# Patient Record
Sex: Female | Born: 1950 | Race: White | Hispanic: No | Marital: Married | Smoking: Never smoker
Health system: Southern US, Community
[De-identification: ages and names within clinical notes are randomized; demographics above are authoritative.]

## PROBLEM LIST (undated history)

## (undated) DIAGNOSIS — U071 COVID-19: Secondary | ICD-10-CM

## (undated) DIAGNOSIS — R000 Tachycardia, unspecified: Secondary | ICD-10-CM

## (undated) DIAGNOSIS — Z86718 Personal history of other venous thrombosis and embolism: Secondary | ICD-10-CM

## (undated) DIAGNOSIS — M1711 Unilateral primary osteoarthritis, right knee: Secondary | ICD-10-CM

## (undated) DIAGNOSIS — Z923 Personal history of irradiation: Secondary | ICD-10-CM

## (undated) DIAGNOSIS — E785 Hyperlipidemia, unspecified: Secondary | ICD-10-CM

## (undated) DIAGNOSIS — T7840XA Allergy, unspecified, initial encounter: Secondary | ICD-10-CM

## (undated) DIAGNOSIS — Z7901 Long term (current) use of anticoagulants: Secondary | ICD-10-CM

## (undated) DIAGNOSIS — I700 Atherosclerosis of aorta: Secondary | ICD-10-CM

## (undated) DIAGNOSIS — E559 Vitamin D deficiency, unspecified: Secondary | ICD-10-CM

## (undated) DIAGNOSIS — I10 Essential (primary) hypertension: Secondary | ICD-10-CM

## (undated) DIAGNOSIS — D126 Benign neoplasm of colon, unspecified: Secondary | ICD-10-CM

## (undated) DIAGNOSIS — C801 Malignant (primary) neoplasm, unspecified: Secondary | ICD-10-CM

## (undated) DIAGNOSIS — M1991 Primary osteoarthritis, unspecified site: Secondary | ICD-10-CM

## (undated) DIAGNOSIS — C541 Malignant neoplasm of endometrium: Secondary | ICD-10-CM

## (undated) HISTORY — DX: Malignant (primary) neoplasm, unspecified: C80.1

## (undated) HISTORY — DX: COVID-19: U07.1

## (undated) HISTORY — DX: Long term (current) use of anticoagulants: Z79.01

## (undated) HISTORY — DX: Allergy, unspecified, initial encounter: T78.40XA

## (undated) HISTORY — PX: ABDOMINAL HYSTERECTOMY: SHX81

## (undated) HISTORY — DX: Personal history of other venous thrombosis and embolism: Z86.718

---

## 2000-07-21 ENCOUNTER — Other Ambulatory Visit: Admission: RE | Admit: 2000-07-21 | Discharge: 2000-07-21 | Payer: Self-pay | Admitting: Obstetrics and Gynecology

## 2012-09-23 ENCOUNTER — Emergency Department: Payer: Self-pay | Admitting: Internal Medicine

## 2012-09-23 LAB — URINALYSIS, COMPLETE
Glucose,UR: NEGATIVE mg/dL (ref 0–75)
Ketone: NEGATIVE
Leukocyte Esterase: NEGATIVE
Ph: 6 (ref 4.5–8.0)
Protein: NEGATIVE
RBC,UR: 199 /HPF (ref 0–5)
Squamous Epithelial: 8
WBC UR: 2 /HPF (ref 0–5)

## 2012-09-23 LAB — COMPREHENSIVE METABOLIC PANEL
Anion Gap: 10 (ref 7–16)
Bilirubin,Total: 0.4 mg/dL (ref 0.2–1.0)
Calcium, Total: 9 mg/dL (ref 8.5–10.1)
Co2: 25 mmol/L (ref 21–32)
Creatinine: 0.98 mg/dL (ref 0.60–1.30)
EGFR (Non-African Amer.): >60
Glucose: 142 mg/dL — ABNORMAL HIGH (ref 65–99)
Osmolality: 283 (ref 275–301)
Potassium: 3.9 mmol/L (ref 3.5–5.1)
SGOT(AST): 21 U/L (ref 15–37)
SGPT (ALT): 26 U/L (ref 12–78)
Sodium: 141 mmol/L (ref 136–145)

## 2012-09-23 LAB — CBC
HCT: 42.5 % (ref 35.0–47.0)
MCH: 31.2 pg (ref 26.0–34.0)
MCHC: 33.1 g/dL (ref 32.0–36.0)
RBC: 4.5 10*6/uL (ref 3.80–5.20)

## 2020-07-22 DIAGNOSIS — Z86711 Personal history of pulmonary embolism: Secondary | ICD-10-CM

## 2020-07-22 HISTORY — DX: Personal history of pulmonary embolism: Z86.711

## 2021-06-04 DIAGNOSIS — M25562 Pain in left knee: Secondary | ICD-10-CM | POA: Diagnosis not present

## 2021-06-06 DIAGNOSIS — S83282A Other tear of lateral meniscus, current injury, left knee, initial encounter: Secondary | ICD-10-CM | POA: Diagnosis not present

## 2021-06-06 DIAGNOSIS — M25562 Pain in left knee: Secondary | ICD-10-CM | POA: Diagnosis not present

## 2021-06-07 ENCOUNTER — Ambulatory Visit
Admission: RE | Admit: 2021-06-07 | Discharge: 2021-06-07 | Disposition: A | Discharge status: Home / Self Care | Payer: Medicare Other | Source: Ambulatory Visit | Attending: Student | Admitting: Student

## 2021-06-07 ENCOUNTER — Other Ambulatory Visit: Payer: Self-pay | Admitting: Student

## 2021-06-07 ENCOUNTER — Other Ambulatory Visit: Payer: Self-pay

## 2021-06-07 DIAGNOSIS — M25562 Pain in left knee: Secondary | ICD-10-CM | POA: Insufficient documentation

## 2021-06-07 DIAGNOSIS — S83282A Other tear of lateral meniscus, current injury, left knee, initial encounter: Secondary | ICD-10-CM | POA: Insufficient documentation

## 2021-06-08 DIAGNOSIS — M25562 Pain in left knee: Secondary | ICD-10-CM | POA: Diagnosis not present

## 2021-06-11 ENCOUNTER — Other Ambulatory Visit: Payer: Self-pay | Admitting: Surgery

## 2021-06-11 DIAGNOSIS — M1712 Unilateral primary osteoarthritis, left knee: Secondary | ICD-10-CM | POA: Insufficient documentation

## 2021-06-11 DIAGNOSIS — S83232A Complex tear of medial meniscus, current injury, left knee, initial encounter: Secondary | ICD-10-CM | POA: Diagnosis not present

## 2021-06-12 ENCOUNTER — Ambulatory Visit: Payer: Medicare Other | Admitting: Anesthesiology

## 2021-06-12 ENCOUNTER — Encounter: Payer: Self-pay | Admitting: Surgery

## 2021-06-12 ENCOUNTER — Ambulatory Visit
Admission: RE | Admit: 2021-06-12 | Discharge: 2021-06-12 | Disposition: A | Discharge status: Home / Self Care | Payer: Medicare Other | Attending: Surgery | Admitting: Surgery

## 2021-06-12 ENCOUNTER — Other Ambulatory Visit: Payer: Self-pay

## 2021-06-12 ENCOUNTER — Encounter
Admission: RE | Disposition: A | Discharge status: Home / Self Care | Payer: Self-pay | Source: Home / Self Care | Attending: Surgery

## 2021-06-12 DIAGNOSIS — S83232A Complex tear of medial meniscus, current injury, left knee, initial encounter: Secondary | ICD-10-CM | POA: Diagnosis not present

## 2021-06-12 DIAGNOSIS — M1712 Unilateral primary osteoarthritis, left knee: Secondary | ICD-10-CM | POA: Insufficient documentation

## 2021-06-12 DIAGNOSIS — X58XXXA Exposure to other specified factors, initial encounter: Secondary | ICD-10-CM | POA: Diagnosis not present

## 2021-06-12 DIAGNOSIS — S83242A Other tear of medial meniscus, current injury, left knee, initial encounter: Secondary | ICD-10-CM | POA: Diagnosis not present

## 2021-06-12 HISTORY — PX: KNEE ARTHROSCOPY: SHX127

## 2021-06-12 SURGERY — ARTHROSCOPY, KNEE
Anesthesia: General | Site: Knee | Laterality: Left

## 2021-06-12 MED ORDER — CHLORHEXIDINE GLUCONATE 0.12 % MT SOLN
OROMUCOSAL | Status: AC
  Administered 2021-06-12: 15 mL via OROMUCOSAL
  Filled 2021-06-12: qty 15

## 2021-06-12 MED ORDER — PROPOFOL 10 MG/ML IV BOLUS
INTRAVENOUS | Status: DC | PRN
  Administered 2021-06-12: 150 mg via INTRAVENOUS

## 2021-06-12 MED ORDER — PHENYLEPHRINE HCL-NACL 20-0.9 MG/250ML-% IV SOLN
INTRAVENOUS | Status: DC | PRN
  Administered 2021-06-12: 30 ug/min via INTRAVENOUS

## 2021-06-12 MED ORDER — LIDOCAINE HCL 1 % IJ SOLN
INTRAMUSCULAR | Status: DC | PRN
  Administered 2021-06-12: 60 mL

## 2021-06-12 MED ORDER — LIDOCAINE HCL (CARDIAC) PF 100 MG/5ML IV SOSY
PREFILLED_SYRINGE | INTRAVENOUS | Status: DC | PRN
  Administered 2021-06-12: 60 mg via INTRAVENOUS

## 2021-06-12 MED ORDER — CLINDAMYCIN PHOSPHATE 900 MG/50ML IV SOLN
900.0000 mg | INTRAVENOUS | Status: AC
  Administered 2021-06-12: 900 mg via INTRAVENOUS

## 2021-06-12 MED ORDER — FENTANYL CITRATE (PF) 100 MCG/2ML IJ SOLN
INTRAMUSCULAR | Status: DC | PRN
  Administered 2021-06-12 (×4): 25 ug via INTRAVENOUS

## 2021-06-12 MED ORDER — PHENYLEPHRINE HCL (PRESSORS) 10 MG/ML IV SOLN
INTRAVENOUS | Status: DC | PRN
  Administered 2021-06-12: 50 ug via INTRAVENOUS
  Administered 2021-06-12 (×2): 100 ug via INTRAVENOUS

## 2021-06-12 MED ORDER — ONDANSETRON HCL 4 MG/2ML IJ SOLN
INTRAMUSCULAR | Status: AC
  Filled 2021-06-12: qty 2

## 2021-06-12 MED ORDER — TRAMADOL HCL 50 MG PO TABS
ORAL_TABLET | ORAL | Status: AC
  Administered 2021-06-12: 50 mg via ORAL
  Filled 2021-06-12: qty 1

## 2021-06-12 MED ORDER — CHLORHEXIDINE GLUCONATE 0.12 % MT SOLN: 15.0000 mL | Freq: Once | OROMUCOSAL | Status: AC

## 2021-06-12 MED ORDER — LIDOCAINE HCL (PF) 1 % IJ SOLN
INTRAMUSCULAR | Status: AC
  Filled 2021-06-12: qty 30

## 2021-06-12 MED ORDER — MIDAZOLAM HCL 2 MG/2ML IJ SOLN
INTRAMUSCULAR | Status: AC
  Filled 2021-06-12: qty 2

## 2021-06-12 MED ORDER — ONDANSETRON HCL 4 MG/2ML IJ SOLN: 4.0000 mg | Freq: Four times a day (QID) | INTRAMUSCULAR | Status: DC | PRN

## 2021-06-12 MED ORDER — TRAMADOL HCL 50 MG PO TABS: 50.0000 mg | ORAL_TABLET | Freq: Four times a day (QID) | ORAL | Status: DC | PRN

## 2021-06-12 MED ORDER — TRAMADOL HCL 50 MG PO TABS: 50.0000 mg | ORAL_TABLET | Freq: Four times a day (QID) | ORAL | 0 refills | Status: DC | PRN

## 2021-06-12 MED ORDER — BUPIVACAINE-EPINEPHRINE (PF) 0.5% -1:200000 IJ SOLN
INTRAMUSCULAR | Status: AC
  Filled 2021-06-12: qty 30

## 2021-06-12 MED ORDER — ONDANSETRON HCL 4 MG/2ML IJ SOLN
INTRAMUSCULAR | Status: DC | PRN
  Administered 2021-06-12: 4 mg via INTRAVENOUS

## 2021-06-12 MED ORDER — FENTANYL CITRATE (PF) 100 MCG/2ML IJ SOLN
25.0000 ug | INTRAMUSCULAR | Status: DC | PRN
  Administered 2021-06-12: 50 ug via INTRAVENOUS

## 2021-06-12 MED ORDER — FENTANYL CITRATE (PF) 100 MCG/2ML IJ SOLN
INTRAMUSCULAR | Status: AC
  Administered 2021-06-12: 50 ug via INTRAVENOUS
  Filled 2021-06-12: qty 2

## 2021-06-12 MED ORDER — FENTANYL CITRATE (PF) 100 MCG/2ML IJ SOLN
INTRAMUSCULAR | Status: AC
  Filled 2021-06-12: qty 2

## 2021-06-12 MED ORDER — BUPIVACAINE-EPINEPHRINE (PF) 0.5% -1:200000 IJ SOLN
INTRAMUSCULAR | Status: DC | PRN
  Administered 2021-06-12: 20 mL

## 2021-06-12 MED ORDER — LACTATED RINGERS IR SOLN
Status: DC | PRN
  Administered 2021-06-12: 3000 mL

## 2021-06-12 MED ORDER — DEXAMETHASONE SODIUM PHOSPHATE 10 MG/ML IJ SOLN
INTRAMUSCULAR | Status: AC
  Filled 2021-06-12: qty 1

## 2021-06-12 MED ORDER — KETOROLAC TROMETHAMINE 15 MG/ML IJ SOLN
INTRAMUSCULAR | Status: AC
  Administered 2021-06-12: 15 mg via INTRAVENOUS
  Filled 2021-06-12: qty 1

## 2021-06-12 MED ORDER — GLYCOPYRROLATE 0.2 MG/ML IJ SOLN
INTRAMUSCULAR | Status: DC | PRN
  Administered 2021-06-12: .2 mg via INTRAVENOUS

## 2021-06-12 MED ORDER — METOCLOPRAMIDE HCL 10 MG PO TABS: 5.0000 mg | ORAL_TABLET | Freq: Three times a day (TID) | ORAL | Status: DC | PRN

## 2021-06-12 MED ORDER — MEPERIDINE HCL 25 MG/ML IJ SOLN
6.2500 mg | INTRAMUSCULAR | Status: DC | PRN
Start: 2021-06-12 — End: 2021-06-13

## 2021-06-12 MED ORDER — METOCLOPRAMIDE HCL 5 MG/ML IJ SOLN: 5.0000 mg | Freq: Three times a day (TID) | INTRAMUSCULAR | Status: DC | PRN

## 2021-06-12 MED ORDER — CLINDAMYCIN PHOSPHATE 900 MG/50ML IV SOLN
INTRAVENOUS | Status: AC
  Filled 2021-06-12: qty 50

## 2021-06-12 MED ORDER — MIDAZOLAM HCL 2 MG/2ML IJ SOLN
INTRAMUSCULAR | Status: DC | PRN
  Administered 2021-06-12: 2 mg via INTRAVENOUS

## 2021-06-12 MED ORDER — PROPOFOL 10 MG/ML IV BOLUS
INTRAVENOUS | Status: AC
  Filled 2021-06-12: qty 20

## 2021-06-12 MED ORDER — ONDANSETRON HCL 4 MG/2ML IJ SOLN: 4.0000 mg | Freq: Once | INTRAMUSCULAR | Status: DC | PRN

## 2021-06-12 MED ORDER — SODIUM CHLORIDE 0.9 % IV SOLN: INTRAVENOUS | Status: DC

## 2021-06-12 MED ORDER — DEXAMETHASONE SODIUM PHOSPHATE 10 MG/ML IJ SOLN
INTRAMUSCULAR | Status: DC | PRN
  Administered 2021-06-12: 10 mg via INTRAVENOUS

## 2021-06-12 MED ORDER — ORAL CARE MOUTH RINSE: 15.0000 mL | Freq: Once | OROMUCOSAL | Status: AC

## 2021-06-12 MED ORDER — ACETAMINOPHEN 10 MG/ML IV SOLN
INTRAVENOUS | Status: DC | PRN
Start: 2021-06-12 — End: 2021-06-12
  Administered 2021-06-12: 1000 mg via INTRAVENOUS

## 2021-06-12 MED ORDER — ONDANSETRON HCL 4 MG PO TABS: 4.0000 mg | ORAL_TABLET | Freq: Four times a day (QID) | ORAL | Status: DC | PRN

## 2021-06-12 MED ORDER — ACETAMINOPHEN 10 MG/ML IV SOLN
INTRAVENOUS | Status: AC
  Filled 2021-06-12: qty 100

## 2021-06-12 MED ORDER — LIDOCAINE HCL (PF) 2 % IJ SOLN
INTRAMUSCULAR | Status: AC
  Filled 2021-06-12: qty 5

## 2021-06-12 MED ORDER — LACTATED RINGERS IV SOLN: INTRAVENOUS | Status: DC

## 2021-06-12 MED ORDER — KETOROLAC TROMETHAMINE 15 MG/ML IJ SOLN: 15.0000 mg | Freq: Once | INTRAMUSCULAR | Status: AC

## 2021-06-12 SURGICAL SUPPLY — 52 items
ANCH SUT 4.5 FTPRNT PEEK-OPTM (Anchor) ×1 IMPLANT
ANCH SUT BLU HS FBR ULTRALOOP (SUTURE) ×1
ANCHOR 4.5 FOOTPRINT ULTRA (Anchor) ×1 IMPLANT
APL PRP STRL LF DISP 70% ISPRP (MISCELLANEOUS) ×1
BAG COUNTER SPONGE SURGICOUNT (BAG) IMPLANT
BAG SPNG CNTER NS LX DISP (BAG)
BIT DRILL 4X4.5 FOOTPRINT STR (BIT) IMPLANT
BLADE FULL RADIUS 3.5 (BLADE) ×2 IMPLANT
BLADE SHAVER 4.5X7 STR FR (MISCELLANEOUS) ×2 IMPLANT
BNDG ELASTIC 6X5.8 VLCR STR LF (GAUZE/BANDAGES/DRESSINGS) ×2 IMPLANT
BNDG ESMARK 6X12 TAN STRL LF (GAUZE/BANDAGES/DRESSINGS) ×2 IMPLANT
BRACE KNEE POST OP SHORT (BRACE) ×1 IMPLANT
CHLORAPREP W/TINT 26 (MISCELLANEOUS) ×2 IMPLANT
CUFF TOURN SGL QUICK 24 (TOURNIQUET CUFF)
CUFF TOURN SGL QUICK 34 (TOURNIQUET CUFF)
CUFF TRNQT CYL 24X4X16.5-23 (TOURNIQUET CUFF) IMPLANT
CUFF TRNQT CYL 34X4.125X (TOURNIQUET CUFF) IMPLANT
DRAPE ARTHRO LIMB 89X125 STRL (DRAPES) ×2 IMPLANT
DRAPE IMP U-DRAPE 54X76 (DRAPES) ×2 IMPLANT
DRILL 4X4.5 FOOTPRINT STR (BIT) ×2
ELECT REM PT RETURN 9FT ADLT (ELECTROSURGICAL) ×2
ELECTRODE REM PT RTRN 9FT ADLT (ELECTROSURGICAL) ×1 IMPLANT
GAUZE SPONGE 4X4 12PLY STRL (GAUZE/BANDAGES/DRESSINGS) ×2 IMPLANT
GLOVE SURG ENC MOIS LTX SZ8 (GLOVE) ×4 IMPLANT
GLOVE SURG ENC TEXT LTX SZ7 (GLOVE) ×4 IMPLANT
GLOVE SURG UNDER LTX SZ8 (GLOVE) ×2 IMPLANT
GLOVE SURG UNDER POLY LF SZ7.5 (GLOVE) ×2 IMPLANT
GOWN STRL REUS W/ TWL LRG LVL3 (GOWN DISPOSABLE) ×1 IMPLANT
GOWN STRL REUS W/ TWL XL LVL3 (GOWN DISPOSABLE) ×2 IMPLANT
GOWN STRL REUS W/TWL LRG LVL3 (GOWN DISPOSABLE) ×2
GOWN STRL REUS W/TWL XL LVL3 (GOWN DISPOSABLE) ×4
IV LACTATED RINGER IRRG 3000ML (IV SOLUTION) ×2
IV LR IRRIG 3000ML ARTHROMATIC (IV SOLUTION) ×1 IMPLANT
KIT MENISCAL ROOT REPAIR (KITS) ×1 IMPLANT
KIT TURNOVER KIT A (KITS) ×2 IMPLANT
MANIFOLD NEPTUNE II (INSTRUMENTS) ×4 IMPLANT
NDL HYPO 21X1.5 SAFETY (NEEDLE) ×1 IMPLANT
NEEDLE HYPO 21X1.5 SAFETY (NEEDLE) ×2 IMPLANT
PACK ARTHROSCOPY KNEE (MISCELLANEOUS) ×2 IMPLANT
PASSER SUT FASTPASS MINI (KITS) ×1 IMPLANT
PENCIL ELECTRO HAND CTR (MISCELLANEOUS) ×2 IMPLANT
SPONGE T-LAP 18X18 ~~LOC~~+RFID (SPONGE) ×2 IMPLANT
SUT PROLENE 4 0 PS 2 18 (SUTURE) ×2 IMPLANT
SUT TICRON COATED BLUE 2 0 30 (SUTURE) IMPLANT
SUT ULTRALOOP BLUE #2 26IN (SUTURE) ×1 IMPLANT
SUT VIC AB 3-0 SH 27 (SUTURE) ×2
SUT VIC AB 3-0 SH 27X BRD (SUTURE) IMPLANT
SYR 50ML LL SCALE MARK (SYRINGE) ×2 IMPLANT
TUBING INFLOW SET DBFLO PUMP (TUBING) ×2 IMPLANT
WAND WEREWOLF FLOW 90D (MISCELLANEOUS) ×2 IMPLANT
WATER STERILE IRR 500ML POUR (IV SOLUTION) ×2 IMPLANT
hi-fiber ultraloop ×2 IMPLANT

## 2021-06-12 NOTE — Transfer of Care (Signed)
Immediate Anesthesia Transfer of Care Note  Patient: Sandra Phillips  Procedure(s) Performed: LEFT KNEE ARTHROSCOPY WITH DEBRIDEMENT AND REPAIR OF A POSTERIOR MEDIAL ROOT TEAR AND ABRASION CONDOPLASTY (Left: Knee)  Patient Location: PACU  Anesthesia Type:General  Level of Consciousness: awake, alert  and oriented  Airway & Oxygen Therapy: Patient Spontanous Breathing  Post-op Assessment: Report given to RN and Post -op Vital signs reviewed and stable  Post vital signs: Reviewed and stable  Last Vitals:  Vitals Value Taken Time  BP 146/76   Temp    Pulse 98   Resp 20   SpO2 99     Last Pain:  Vitals:   06/12/21 1404  TempSrc: Temporal  PainSc: 0-No pain         Complications: No notable events documented.

## 2021-06-12 NOTE — H&P (Signed)
History of Present Illness: Sandra Phillips is a 70 y.o.female who is being referred by Cameron Proud, PA-C, for left knee pain. The symptoms began a week ago and developed suddenly. Apparently she was ascending some stairs with some luggage when she felt a sharp "pop" in her knee associated with a stabbing pain, causing her to fall to the ground. She presented to the urgent care clinic where x-rays were obtained and reportedly were negative for any fracture. She followed up with Cameron Proud, PA-C, who sent her for an MRI scan to evaluate for meniscal pathology and referred her to me for further evaluation and treatment. She reports 3/10 pain on today's visit but notes that her pain is much worse if she attempts to put any weight on her leg. The pain is located along the medial aspect of the knee. The pain is described as aching, stabbing and throbbing. The symptoms are aggravated at higher levels of activity, rising from a chair, walking, standing and standing pivot. She also describes no mechanical symptoms, but notes that she has not tried to put any weight on her leg either. She has mild associated swelling and no deformity. She has tried acetaminophen, narcotic medications and ice with temporary partial relief.  Current Outpatient Medications:  acetaminophen (TYLENOL) 500 MG tablet Take 2 tablets (1,000 mg total) by mouth every 6 (six) hours as needed for Pain   diazePAM (VALIUM) 5 MG tablet Take 1 tablet 30 minutes prior to MRI. If needed take a second at start of exam. 2 tablet 0   predniSONE (DELTASONE) 10 mg tablet pack 6 day taper. Take as directed with food 21 tablet 0   traMADoL (ULTRAM) 50 mg tablet Take 1 tablet (50 mg total) by mouth every 6 (six) hours as needed for up to 10 days 20 tablet 0   Allergies:   Penicillins Anaphylaxis   Past Medical History:   History of chicken pox   Past Surgical History:   wisdom teeth   Family History:   Bladder Cancer Mother   Diabetes type II Father    Alzheimer's disease Father   Rheum arthritis Sister   Social History:   Socioeconomic History:   Marital status: Single  Tobacco Use   Smoking status: Never   Smokeless tobacco: Never  Vaping Use   Vaping Use: Never used  Substance and Sexual Activity   Alcohol use: Yes   Drug use: Never   Review of Systems:  A comprehensive 14 point ROS was performed, reviewed, and the pertinent orthopaedic findings are documented in the HPI.  Physical Exam: Vitals:  06/11/21 1304  BP: 122/88  Weight: 99.8 kg (220 lb)  Height: 172.7 cm (5\' 8" )  PainSc: 3  PainLoc: Knee   General/Constitutional: The patient appears to be well-nourished, well-developed, and in no acute distress. Neuro/Psych: Normal mood and affect, oriented to person, place and time. Eyes: Non-icteric. Pupils are equal, round, and reactive to light, and exhibit synchronous movement. Lymphatic: No palpable adenopathy. Respiratory: Lungs clear to auscultation, Normal chest excursion, No wheezes and Non-labored breathing Cardiovascular: Regular rate and rhythm. No murmurs. and No edema, swelling or tenderness, except as noted in detailed exam. Vascular: No edema, swelling or tenderness, except as noted in detailed exam. Integumentary: No impressive skin lesions present, except as noted in detailed exam. Musculoskeletal: Unremarkable, except as noted in detailed exam.  Right knee exam: GAIT: unable to walk and uses crutches. ALIGNMENT: normal SKIN: unremarkable SWELLING: minimal EFFUSION: trace WARMTH: no warmth TENDERNESS:  moderate over the medial joint line mild tenderness along the lateral joint line ROM: 0 to 95 degrees with pain in maximal flexion McMURRAY'S: positive PATELLOFEMORAL: normal tracking with no peri-patellar tenderness and negative apprehension sign CREPITUS: no LACHMAN'S: negative PIVOT SHIFT: Not evaluated ANTERIOR DRAWER: negative POSTERIOR DRAWER: negative VARUS/VALGUS: stable  She is  neurovascularly intact to the left lower extremity and foot.  Knee Imaging: Recent AP and lateral non-weightbearing views, as well as a merchant view of the left knee are available for review and have been reviewed by myself. These films demonstrate mild degenerative changes, primarily involving the medial compartment with at most 10% medial joint space narrowing. Overall alignment is neutral. No fractures, lytic lesions, or abnormal calcifications are noted.  Knee Imaging, external: Left knee: A recent MRI scan of the left knee is available for review. By report, the study demonstrates evidence of a complex posterior medial root tear with further signal abnormalities consistent with a horizontal tear involving the posterior horn and body of the medial meniscus. Moderate chondral thinning of the medial compartment is noted. The lateral meniscus and ligaments appear to be in satisfactory condition. Mild degenerative changes of the lateral patellofemoral compartments also are noted. Both the films and report were reviewed by myself and discussed with the patient and her husband.  Assessment:   Primary osteoarthritis of left knee.   Complex tear of medial meniscus of left knee.   Plan: The treatment options were discussed with the patient and her husband. In addition, patient educational materials were provided regarding the diagnosis and treatment options. The patient is quite frustrated by her symptoms and functional patients, and is ready to consider more aggressive treatment options. Therefore, I have recommended a surgical procedure, specifically a left knee arthroscopy with debridement and repair of the posterior medial root tear versus partial medial meniscectomy. The procedure was discussed with the patient, as were the potential risks (including bleeding, infection, nerve and/or blood vessel injury, persistent or recurrent pain, failure of the repair, progression of arthritis, need for further  surgery, blood clots, strokes, heart attacks and/or arhythmias, pneumonia, etc.) and benefits. The patient states her understanding and wishes to proceed. All of the patient's questions and concerns were answered. She can call any time with further concerns. She will follow up post-surgery, routine.   H&P reviewed and patient re-examined. No changes.

## 2021-06-12 NOTE — Op Note (Signed)
06/12/2021  5:59 PM  Patient:   Sandra Phillips  Pre-Op Diagnosis:   Medial meniscus root tear with underlying degenerative joint disease, left knee.  Postoperative diagnosis:   Medial meniscus root tear with moderate degenerative joint disease, left knee.  Procedure:   Arthroscopic debridement/abrasion chondroplasty with arthroscopically-assisted repair of medial meniscus root tear, left knee.  Surgeon:   Pascal Lux, M.D.  Assistant:   Kinnie Feil, RNFA  Anesthesia:   General LMA.  Findings:   As above.  There were diffuse grade 2-3 chondromalacial changes involving the patella and femoral trochlea, as well as the medial femoral condyle and medial tibial plateau.  There were grade 1-2 chondromalacial changes involving the lateral tibial plateau, and grade 1 chondromalacial changes involving the lateral femoral condyle.  The anterior and posterior cruciate ligaments both were in excellent condition, as was the lateral meniscus.  Complications:   None.  EBL:   5 cc.  Total fluids:   800 cc of crystalloid.  Tourniquet time:   None  Drains:   None  Closure:   Staples.  Brief clinical note:   The patient is a 70 year old female who developed the sudden onset of medial sided left knee pain 1 week ago. These symptoms have persisted despite medications, activity modification, etc. The patient's history and examination were consistent with a medial meniscus tear. An MRI scan demonstrated the presence of a posterior horn medial meniscus root tear. The patient presents at this time for arthroscopy, debridement, and repair versus partial medial meniscectomy of her left knee.  Procedure:   The patient was brought into the operating room and lain in the supine position. After adequate general laryngeal mask anesthesia was obtained, a timeout was performed to verify the appropriate side. The patient's left knee was injected sterilely using a solution of 30 cc of 1% lidocaine and 30 cc of  0.5% Sensorcaine with epinephrine. The left lower extremity was prepped with ChloraPrep solution before being draped sterilely. Preoperative antibiotics were administered. The expected portal sites and surgical incision site were injected with 0.5% Sensorcaine with epinephrine.    The camera was placed in the anterolateral portal and instrumentation performed through the anteromedial portal. The knee was sequentially examined beginning in the suprapatellar pouch, then progressing to the patellofemoral space, the medial gutter compartment, the notch, and finally the lateral compartment and gutter. The findings were as described above. Abundant reactive synovial tissues anteriorly were debrided using the full-radius resector in order to improve visualization. 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  The medial meniscus was carefully probed and demonstrated an unstable tear of the meniscal root. This was repaired using the [facility] meniscal root repair system. The end of the tear was freshened with a full-radius resector before the attachment site on the proximal tibia was curetted and debrided with the full-radius resector to expose good bleeding bone. The [facility] guide was positioned in the over-the-top position and, utilizing the 55 degree angle setting, the drill/sleeve combination was drilled up into the proximal tibia through a short anterior incision. Once its position was verified intra-articularly, the central drill was removed, leaving the sleeve in place. Once its position was verified intra-articularly, the central drill was removed, leaving the sleeve in place. A looped passing suture was placed up through the retained sleeve and pulled out  through the anteromedial wound. Utilizing the FirstPass suture passer, two #2  FiberWire sutures were placed into the meniscal root. Using the passing loop, the FiberWire was drawn down through the drill hole in the proximal tibia and brought out anteriorly. A single [employer] anchor was placed in the anterior tibial cortex to secure the sutures. The repair was assessed and found to be stable to probing. It also appeared to be stable with range of motion of the knee. The instruments were removed from the joint after suctioning the excess fluid.   The subcutaneous tissues in the anterior wound were reapproximated using 2-0 Vicryl interrupted sutures before the skin was closed using 4-0 Prolene interrupted sutures. The portal sites also were closed using 4-0 Prolene interrupted sutures. A sterile bulky dressing was applied to the knee before the patient was placed into a hinged knee brace with the hinges set at 0-90, but locked in extension. The patient was then awakened, extubated, and returned to the recovery room in satisfactory condition after tolerating the procedure well.

## 2021-06-12 NOTE — Discharge Instructions (Signed)
Orthopedic discharge instructions: Keep dressing dry and intact.  May sponge bathe after dressing changed on post-op day #4 (Saturday).  Cover stitches with Band-Aids after drying off. Apply ice frequently to knee. Take ibuprofen 600-800 mg TID with meals for 7-10 days, then as necessary. Take tramadol as prescribed when needed.  May supplement with ES Tylenol if necessary. Start aspirin 325 mg daily for 6 weeks on 06/13/2021 (Wednesday). No weight-bearing on left leg - use crutches or crutches as needed. Keep brace on at all times except may loosen for bathing purposes. Follow-up in 10-14 days or as scheduled.

## 2021-06-12 NOTE — Anesthesia Preprocedure Evaluation (Signed)
Anesthesia Evaluation  Patient identified by MRN, date of birth, ID band Patient awake    Reviewed: Allergy & Precautions, H&P , NPO status , Patient's Chart, lab work & pertinent test results  Airway Mallampati: II  TM Distance: >3 FB     Dental no notable dental hx.    Pulmonary neg pulmonary ROS,    Pulmonary exam normal        Cardiovascular negative cardio ROS Normal cardiovascular exam     Neuro/Psych negative neurological ROS  negative psych ROS   GI/Hepatic negative GI ROS, Neg liver ROS,   Endo/Other  negative endocrine ROS  Renal/GU negative Renal ROS  negative genitourinary   Musculoskeletal negative musculoskeletal ROS (+)   Abdominal (+) + obese,   Peds negative pediatric ROS (+)  Hematology negative hematology ROS (+)   Anesthesia Other Findings   Reproductive/Obstetrics negative OB ROS                             Anesthesia Physical Anesthesia Plan  ASA: 2  Anesthesia Plan: General   Post-op Pain Management:    Induction: Intravenous  PONV Risk Score and Plan: 2 and Propofol infusion, Ondansetron and Midazolam  Airway Management Planned: LMA  Additional Equipment:   Intra-op Plan:   Post-operative Plan: Extubation in OR  Informed Consent: I have reviewed the patients History and Physical, chart, labs and discussed the procedure including the risks, benefits and alternatives for the proposed anesthesia with the patient or authorized representative who has indicated his/her understanding and acceptance.       Plan Discussed with: CRNA, Surgeon and Anesthesiologist  Anesthesia Plan Comments:         Anesthesia Quick Evaluation

## 2021-06-12 NOTE — Anesthesia Postprocedure Evaluation (Signed)
Anesthesia Post Note  Patient: Sandra Phillips  Procedure(s) Performed: LEFT KNEE ARTHROSCOPY WITH DEBRIDEMENT AND REPAIR OF A POSTERIOR MEDIAL ROOT TEAR AND ABRASION CONDOPLASTY (Left: Knee)  Patient location during evaluation: PACU Anesthesia Type: General Level of consciousness: awake and alert Pain management: pain level controlled Vital Signs Assessment: post-procedure vital signs reviewed and stable Respiratory status: spontaneous breathing, nonlabored ventilation and respiratory function stable Cardiovascular status: blood pressure returned to baseline and stable Postop Assessment: no apparent nausea or vomiting Anesthetic complications: no   No notable events documented.   Last Vitals:  Vitals:   06/12/21 1815 06/12/21 1848  BP: 130/79 (!) 146/70  Pulse: 88 73  Resp: 18 14  Temp:  36.4 C  SpO2: 96% 99%    Last Pain:  Vitals:   06/12/21 1848  TempSrc: Temporal  PainSc: 0-No pain                 [place] Ouch

## 2021-06-13 ENCOUNTER — Encounter: Payer: Self-pay | Admitting: Surgery

## 2021-07-04 DIAGNOSIS — Z9889 Other specified postprocedural states: Secondary | ICD-10-CM | POA: Diagnosis not present

## 2021-07-06 DIAGNOSIS — Z9889 Other specified postprocedural states: Secondary | ICD-10-CM | POA: Diagnosis not present

## 2021-07-07 ENCOUNTER — Inpatient Hospital Stay
Admission: EM | Admit: 2021-07-07 | Discharge: 2021-07-11 | DRG: 270 | Disposition: A | Discharge status: Home / Self Care | Payer: Medicare Other | Attending: Student in an Organized Health Care Education/Training Program | Admitting: Student in an Organized Health Care Education/Training Program

## 2021-07-07 ENCOUNTER — Emergency Department: Payer: Medicare Other

## 2021-07-07 DIAGNOSIS — M1712 Unilateral primary osteoarthritis, left knee: Secondary | ICD-10-CM | POA: Diagnosis present

## 2021-07-07 DIAGNOSIS — J9601 Acute respiratory failure with hypoxia: Secondary | ICD-10-CM | POA: Diagnosis present

## 2021-07-07 DIAGNOSIS — R0602 Shortness of breath: Secondary | ICD-10-CM | POA: Diagnosis not present

## 2021-07-07 DIAGNOSIS — I2699 Other pulmonary embolism without acute cor pulmonale: Secondary | ICD-10-CM | POA: Diagnosis not present

## 2021-07-07 DIAGNOSIS — I82432 Acute embolism and thrombosis of left popliteal vein: Secondary | ICD-10-CM | POA: Diagnosis not present

## 2021-07-07 DIAGNOSIS — Z880 Allergy status to penicillin: Secondary | ICD-10-CM | POA: Diagnosis not present

## 2021-07-07 DIAGNOSIS — M159 Polyosteoarthritis, unspecified: Secondary | ICD-10-CM | POA: Diagnosis not present

## 2021-07-07 DIAGNOSIS — R000 Tachycardia, unspecified: Secondary | ICD-10-CM | POA: Diagnosis not present

## 2021-07-07 DIAGNOSIS — Z20822 Contact with and (suspected) exposure to covid-19: Secondary | ICD-10-CM | POA: Diagnosis not present

## 2021-07-07 DIAGNOSIS — T68XXXA Hypothermia, initial encounter: Secondary | ICD-10-CM | POA: Diagnosis not present

## 2021-07-07 DIAGNOSIS — K449 Diaphragmatic hernia without obstruction or gangrene: Secondary | ICD-10-CM | POA: Diagnosis not present

## 2021-07-07 DIAGNOSIS — Z0389 Encounter for observation for other suspected diseases and conditions ruled out: Secondary | ICD-10-CM | POA: Diagnosis not present

## 2021-07-07 DIAGNOSIS — Z7982 Long term (current) use of aspirin: Secondary | ICD-10-CM | POA: Diagnosis not present

## 2021-07-07 DIAGNOSIS — K5900 Constipation, unspecified: Secondary | ICD-10-CM | POA: Diagnosis not present

## 2021-07-07 DIAGNOSIS — R0902 Hypoxemia: Secondary | ICD-10-CM | POA: Diagnosis not present

## 2021-07-07 DIAGNOSIS — M1991 Primary osteoarthritis, unspecified site: Secondary | ICD-10-CM | POA: Diagnosis not present

## 2021-07-07 DIAGNOSIS — Z8261 Family history of arthritis: Secondary | ICD-10-CM | POA: Diagnosis not present

## 2021-07-07 DIAGNOSIS — R52 Pain, unspecified: Secondary | ICD-10-CM | POA: Diagnosis not present

## 2021-07-07 DIAGNOSIS — I82442 Acute embolism and thrombosis of left tibial vein: Secondary | ICD-10-CM | POA: Diagnosis present

## 2021-07-07 DIAGNOSIS — R739 Hyperglycemia, unspecified: Secondary | ICD-10-CM | POA: Diagnosis not present

## 2021-07-07 DIAGNOSIS — Y838 Other surgical procedures as the cause of abnormal reaction of the patient, or of later complication, without mention of misadventure at the time of the procedure: Secondary | ICD-10-CM | POA: Diagnosis present

## 2021-07-07 DIAGNOSIS — M7989 Other specified soft tissue disorders: Secondary | ICD-10-CM | POA: Diagnosis not present

## 2021-07-07 DIAGNOSIS — D72828 Other elevated white blood cell count: Secondary | ICD-10-CM | POA: Diagnosis present

## 2021-07-07 DIAGNOSIS — I2609 Other pulmonary embolism with acute cor pulmonale: Secondary | ICD-10-CM | POA: Diagnosis present

## 2021-07-07 DIAGNOSIS — T81718A Complication of other artery following a procedure, not elsewhere classified, initial encounter: Secondary | ICD-10-CM | POA: Diagnosis present

## 2021-07-07 DIAGNOSIS — I9789 Other postprocedural complications and disorders of the circulatory system, not elsewhere classified: Principal | ICD-10-CM | POA: Diagnosis present

## 2021-07-07 DIAGNOSIS — Z86711 Personal history of pulmonary embolism: Secondary | ICD-10-CM

## 2021-07-07 DIAGNOSIS — I82439 Acute embolism and thrombosis of unspecified popliteal vein: Secondary | ICD-10-CM | POA: Diagnosis not present

## 2021-07-07 DIAGNOSIS — Z86718 Personal history of other venous thrombosis and embolism: Secondary | ICD-10-CM | POA: Diagnosis present

## 2021-07-07 DIAGNOSIS — I2782 Chronic pulmonary embolism: Secondary | ICD-10-CM | POA: Diagnosis not present

## 2021-07-07 HISTORY — DX: Primary osteoarthritis, unspecified site: M19.91

## 2021-07-07 LAB — COMPREHENSIVE METABOLIC PANEL
ALT: 21 U/L (ref 0–44)
AST: 23 U/L (ref 15–41)
Albumin: 3.6 g/dL (ref 3.5–5.0)
Alkaline Phosphatase: 91 U/L (ref 38–126)
Anion gap: 9 (ref 5–15)
BUN: 17 mg/dL (ref 8–23)
CO2: 21 mmol/L — ABNORMAL LOW (ref 22–32)
Calcium: 9.1 mg/dL (ref 8.9–10.3)
Chloride: 104 mmol/L (ref 98–111)
Creatinine, Ser: 0.86 mg/dL (ref 0.44–1.00)
GFR, Estimated: >60 mL/min (ref >60)
Glucose, Bld: 173 mg/dL — ABNORMAL HIGH (ref 70–99)
Potassium: 4 mmol/L (ref 3.5–5.1)
Sodium: 134 mmol/L — ABNORMAL LOW (ref 135–145)
Total Bilirubin: 0.5 mg/dL (ref 0.3–1.2)
Total Protein: 7.3 g/dL (ref 6.5–8.1)

## 2021-07-07 LAB — CBC WITH DIFFERENTIAL/PLATELET
Abs Immature Granulocytes: 0.06 10*3/uL (ref 0.00–0.07)
Basophils Absolute: 0 10*3/uL (ref 0.0–0.1)
Basophils Relative: 0 %
Eosinophils Absolute: 0 10*3/uL (ref 0.0–0.5)
Eosinophils Relative: 0 %
HCT: 40.6 % (ref 36.0–46.0)
Hemoglobin: 13.9 g/dL (ref 12.0–15.0)
Immature Granulocytes: 1 %
Lymphocytes Relative: 12 %
Lymphs Abs: 1.3 10*3/uL (ref 0.7–4.0)
MCH: 32.3 pg (ref 26.0–34.0)
MCHC: 34.2 g/dL (ref 30.0–36.0)
MCV: 94.4 fL (ref 80.0–100.0)
Monocytes Absolute: 0.7 10*3/uL (ref 0.1–1.0)
Monocytes Relative: 7 %
Neutro Abs: 8.6 10*3/uL — ABNORMAL HIGH (ref 1.7–7.7)
Neutrophils Relative %: 80 %
Platelets: 185 10*3/uL (ref 150–400)
RBC: 4.3 MIL/uL (ref 3.87–5.11)
RDW: 13.1 % (ref 11.5–15.5)
WBC: 10.8 10*3/uL — ABNORMAL HIGH (ref 4.0–10.5)
nRBC: 0 % (ref 0.0–0.2)

## 2021-07-07 LAB — RESP PANEL BY RT-PCR (FLU A&B, COVID) ARPGX2
Influenza A by PCR: NEGATIVE
Influenza B by PCR: NEGATIVE
SARS Coronavirus 2 by RT PCR: NEGATIVE

## 2021-07-07 LAB — LACTIC ACID, PLASMA
Lactic Acid, Venous: 3.1 mmol/L (ref 0.5–1.9)
Lactic Acid, Venous: 2.4 mmol/L (ref 0.5–1.9)

## 2021-07-07 LAB — BRAIN NATRIURETIC PEPTIDE: B Natriuretic Peptide: 292.6 pg/mL — ABNORMAL HIGH (ref 0.0–100.0)

## 2021-07-07 LAB — APTT: aPTT: 28 seconds (ref 24–36)

## 2021-07-07 LAB — PROTIME-INR
INR: 1.1 (ref 0.8–1.2)
Prothrombin Time: 14.6 seconds (ref 11.4–15.2)

## 2021-07-07 LAB — TROPONIN I (HIGH SENSITIVITY)
Troponin I (High Sensitivity): 491 ng/L (ref <18)
Troponin I (High Sensitivity): 970 ng/L (ref <18)

## 2021-07-07 MED ORDER — TRAMADOL HCL 50 MG PO TABS: 50.0000 mg | ORAL_TABLET | Freq: Four times a day (QID) | ORAL | Status: DC | PRN

## 2021-07-07 MED ORDER — ONDANSETRON HCL 4 MG/2ML IJ SOLN: 4.0000 mg | Freq: Four times a day (QID) | INTRAMUSCULAR | Status: DC | PRN

## 2021-07-07 MED ORDER — LACTATED RINGERS IV SOLN: INTRAVENOUS | Status: DC

## 2021-07-07 MED ORDER — ONDANSETRON HCL 4 MG PO TABS: 4.0000 mg | ORAL_TABLET | Freq: Four times a day (QID) | ORAL | Status: DC | PRN

## 2021-07-07 MED ORDER — ACETAMINOPHEN 650 MG RE SUPP: 650.0000 mg | Freq: Four times a day (QID) | RECTAL | Status: AC | PRN

## 2021-07-07 MED ORDER — ACETAMINOPHEN 325 MG PO TABS: 650.0000 mg | ORAL_TABLET | Freq: Four times a day (QID) | ORAL | Status: AC | PRN

## 2021-07-07 MED ORDER — HYDRALAZINE HCL 20 MG/ML IJ SOLN: 10.0000 mg | Freq: Four times a day (QID) | INTRAMUSCULAR | Status: AC | PRN

## 2021-07-07 MED ORDER — SODIUM CHLORIDE 0.9 % IV SOLN: Freq: Once | INTRAVENOUS | Status: AC

## 2021-07-07 MED ORDER — HEPARIN BOLUS VIA INFUSION
5200.0000 [IU] | Freq: Once | INTRAVENOUS | Status: AC
  Administered 2021-07-07: 5200 [IU] via INTRAVENOUS
  Filled 2021-07-07: qty 5200

## 2021-07-07 MED ORDER — HEPARIN (PORCINE) 25000 UT/250ML-% IV SOLN
1250.0000 [IU]/h | INTRAVENOUS | Status: AC
  Administered 2021-07-07: 19:00:00 1400 [IU]/h via INTRAVENOUS
  Administered 2021-07-08 – 2021-07-11 (×5): 1250 [IU]/h via INTRAVENOUS
  Filled 2021-07-07 (×5): qty 250

## 2021-07-07 MED ORDER — IOHEXOL 350 MG/ML SOLN
80.0000 mL | Freq: Once | INTRAVENOUS | Status: AC | PRN
  Administered 2021-07-07: 80 mL via INTRAVENOUS

## 2021-07-07 NOTE — Progress Notes (Signed)
[facility] Progress Note Patient Name: Sandra Phillips DOB: 08/31/1950 MRN: [id number]   Date of Service  07/07/2021  HPI/Events of Note  Pt admitted with acute bilat PE's  eICU Interventions  - BP stable - on only 2LNC - resting comfortable - on a heparin gtt     Intervention Category Evaluation Type: New Patient Evaluation  Tilden Dome 07/07/2021, 11:53 PM

## 2021-07-07 NOTE — Progress Notes (Signed)
ANTICOAGULATION CONSULT NOTE  Pharmacy Consult for heparin infusion Indication: pulmonary embolus  Allergies  Allergen Reactions   Penicillins     Unknown childhood reaction    Shrimp Flavor Swelling    Patient Measurements: Weight: 99.8 kg (220 lb) Heparin Dosing Weight: 86.5 kg  Vital Signs: Temp: 97.8 F (36.6 C) (12/17 1611) Temp Source: Oral (12/17 1611) BP: 134/81 (12/17 1606) Pulse Rate: 84 (12/17 1606)  Labs: Recent Labs    07/07/21 1615  HGB 13.9  HCT 40.6  PLT 185  APTT 28  LABPROT 14.6  INR 1.1  CREATININE 0.86  TROPONINIHS 491*    Estimated Creatinine Clearance: 77.4 mL/min (by C-G formula based on SCr of 0.86 mg/dL).   Medical History: No past medical history on file.  Medications:  Per chart review, no anticoagulation noted prior to admission  Assessment: 71 y.o. female with history of recent knee surgery presents the ER for shortness of breath. Pharmacy has been consulted for heparin dosing/monitoring. Baseline CBC WNL.   Goal of Therapy:  Heparin level 0.3-0.7 units/ml Monitor platelets by anticoagulation protocol: Yes   Plan:  Give 5200 units bolus x 1 Start heparin infusion at 1400 units/hr Check anti-Xa level in 6 hours and daily while on heparin Continue to monitor H&H and platelets  Lamyia Cdebaca O Zackory Pudlo 07/07/2021,6:01 PM

## 2021-07-07 NOTE — ED Notes (Signed)
Messaged DO Cox about py BP.

## 2021-07-07 NOTE — ED Notes (Signed)
CBG WAS 300 WITH EMS PTA

## 2021-07-07 NOTE — H&P (Signed)
History and Physical   Sandra Phillips:[id number] DOB: Mar 09, 1951 DOA: 07/07/2021  PCP: [facility] [place] (Inactive) Outpatient Specialists: Dr. Roland Rack Patient coming from: Home  I have personally briefly reviewed patient's old medical records in [place].  Chief Concern: Shortness of breath  HPI: Sandra Phillips is a 70 y.o. female with medical history significant for primary osteoarthritis of the left knee, complex tear of the medial meniscus of the left knee status post left knee arthroscopy with debridement and repair of the posterior medial root tear and abrasion  chondoplasty who presents to the emergency department from home for chief concerns of shortness of breath.  She noticed swelling of her left foot on Tuesday and/or Wednesday (07/04/21).  She endorses compliance with aspirin 325 mg daily.  She states she she has not missed a single dose.  She reports that since the operation, she is mainly sitting in a chair and/or in bed laying down as movement is difficult for her.  She states that she noticed shortness of breath that started approximately 5:30 PM on 12/16 after an intense first physical therapy session. She she states that the shortness of breath improved with resting at home and she slept well on evening of 07/06/2021. Today, she noticed worsening dyspnea with exertion, worsening shortness of breath with rest, diaphoretic, and nausea and vomiting.  She only had Pedialyte today prior to presentation to the ED.  These acute symptoms prompted her to present to the emergency department for further evaluation.  She denies chest pain. She endorses constipation.  She denies diarrhea.  At bedside she is able to tell me her name, age, current location, and partner at bedside.  She states that since the oxygen supplementation and treatments in the emergency department, she states that she has felt much better than when she was at home.  Social history: She  lives with her partner, Saralyn Pilar.  She denies any tobacco, recreational drug use.  She endorses infrequent EtOH, social drinking or with dinner.  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)  ROS: Constitutional: no weight change, no fever ENT/Mouth: no sore throat, no rhinorrhea Eyes: no eye pain, no vision changes Cardiovascular: no chest pain, + dyspnea,  no edema, no palpitations Respiratory: no cough, no sputum, no wheezing Gastrointestinal: + nausea, + vomiting, no diarrhea, no constipation Genitourinary: no urinary incontinence, no dysuria, no hematuria Musculoskeletal: no arthralgias, no myalgias Skin: no skin lesions, no pruritus, Neuro: + weakness, no loss of consciousness, no syncope Psych: no anxiety, no depression, + decrease appetite Heme/Lymph: no bruising, no bleeding  ED Course: Discussed with emergency medicine provider, patient requiring hospitalization for chief concerns of bilateral pulmonary embolism.  Vitals in the emergency department was remarkable for temperature of 97.8, respiration rate of 33 and improved to 16, heart rate initially 113 and improved to 101, blood pressure 134/81, SPO2 100% on 2 L nasal cannula.  Labs in the emergency department is markable for serum sodium 134, potassium 4.0, chloride 104, bicarb 21, nonfasting blood glucose 173, BUN 17, serum creatinine of 0.86, GFR greater than 60.  BNP high sensitive troponin was initially 491.  Lactic acid was initially elevated at 3.1 and decreased to 2.4.  WBC was mildly elevated at 10.8, hemoglobin 13.9, platelets 185.  In the emergency department patient was started on heparin bolus and GTT.  Assessment/Plan  Principal Problem:   Pulmonary embolism (HCC) Active Problems:   Sinus tachycardia   Primary osteoarthritis   Popliteal DVT (deep venous thrombosis) (HCC)    Acute deep vein thrombosis (DVT) of popliteal vein of left lower extremity (HCC)   # Bilateral pulmonary embolism-presumed secondary to recent orthopedic intervention - Patient had repair on 06/12/2021, patient was given aspirin 325 mg daily for 6 weeks postop as DVT prophylaxis - Continue heparin GGT - Pulmonologist has been consulted for consideration of PTA and recommends no PTA given that patient is stable - Vascular surgeon, Dr. Shelia Media has been consulted and states he will see the patient - Complete stat echo ordered to assess for cardiac function - We will follow high-sensitivity troponin - N.p.o. except for sips with meds - Admit to stepdown, inpatient  # Acute left lower extremity DVT in the popliteal and posterior tibial veins - Continue heparin GTT  # Sinus tachycardia-presumed secondary to acute bilateral pulmonary embolism  # Mild leukocytosis-presumed reactive secondary to pulmonary embolism # Osteoarthritis  Chart reviewed.   DVT prophylaxis: Heparin GGT Code Status: Full code Diet: N.p.o. except for sips with meds Family Communication: Updated partner, Saralyn Pilar, who has legal heatlh care of attorney Disposition Plan: Pending clinical course Consults called: Pulmonology Admission status: Inpatient, stepdown  Past Medical History:  Diagnosis Date   Primary osteoarthritis    Past Surgical History:  Procedure Laterality Date   KNEE ARTHROSCOPY Left 06/12/2021   Procedure: LEFT KNEE ARTHROSCOPY WITH DEBRIDEMENT AND REPAIR OF A POSTERIOR MEDIAL [place];  Surgeon: Corky Mull, MD;  Location: [facility] ORS;  Service: Orthopedics;  Laterality: Left;   Social History:  reports that she has never smoked. She has never used smokeless tobacco. She reports current alcohol use of about 1.0 standard drink per week. She reports that she does not use drugs.  Allergies  Allergen Reactions   Penicillins     Unknown childhood reaction    Shrimp Flavor  Swelling   Family History  Problem Relation Age of Onset   Cancer Mother    Rheum arthritis Sister    Family history: Family history reviewed and not pertinent.  Prior to Admission medications   Medication Sig Start Date End Date Taking? Authorizing Provider  aspirin EC 325 MG tablet Take 325 mg by mouth daily.   Yes [provider]  ibuprofen (ADVIL) 200 MG tablet Take 400-800 mg by mouth every 6 (six) hours as needed for mild pain or moderate pain.   Yes [provider]  traMADol (ULTRAM) 50 MG tablet Take 1 tablet (50 mg total) by mouth every 6 (six) hours as needed for moderate pain. 06/12/21 06/12/22 Yes Poggi, Marshall Cork, MD   Physical Exam: Vitals:   07/07/21 1805 07/07/21 1830 07/07/21 1930 07/07/21 2000  BP: (!) 136/96 (!) 120/98 (!) 163/100 (!) 174/110  Pulse: 92 (!) 102 (!) 103 (!) 105  Resp: 16 16 18 18   Temp:      TempSrc:      SpO2: 93% 100% 100% 100%  Weight:      Height:       Constitutional: appears younger than chronological age, NAD, calm, comfortable Eyes: PERRL, lids and conjunctivae normal ENMT: Mucous membranes are moist. Posterior pharynx clear of any exudate or lesions. Age-appropriate dentition. Hearing appropriate. Neck: normal, supple, no masses, no thyromegaly Respiratory: clear to auscultation bilaterally, no wheezing, no crackles. Normal respiratory effort. No accessory muscle use.  Cardiovascular: Regular rate and rhythm,  no murmurs / rubs / gallops.  Left lower extremity pitting edema, 2+. 2+ pedal pulses. No carotid bruits.  Abdomen: Obese, no tenderness, no masses palpated, no hepatosplenomegaly. Bowel sounds positive.  Musculoskeletal: no clubbing / cyanosis. No joint deformity upper and lower extremities. Good ROM, no contractures, no atrophy. Normal muscle tone.  Left lower extremity brace in place. Skin: no rashes, lesions, ulcers. No induration Neurologic: Sensation intact. Strength 5/5 in all 4.  Psychiatric: Normal judgment  and insight. Alert and oriented x 3. Normal mood.   EKG: independently reviewed, showing sinus tachycardia with rate of 108, QTc 445  Chest x-ray on Admission: I personally reviewed and I agree with radiologist reading as below.  CT Angio Chest PE W and/or Wo Contrast  Result Date: 07/07/2021 CLINICAL DATA:  Pulmonary embolism (PE) suspected, high prob EXAM: CT ANGIOGRAPHY CHEST WITH CONTRAST TECHNIQUE: Multidetector CT imaging of the chest was performed using the standard protocol during bolus administration of intravenous contrast. Multiplanar CT image reconstructions and MIPs were obtained to evaluate the vascular anatomy. CONTRAST:  13mL OMNIPAQUE IOHEXOL 350 MG/ML SOLN COMPARISON:  None. FINDINGS: Cardiovascular: Positive for pulmonary emboli. There is a large clot burden within the right and left branch pulmonary arteries, extending into the bilateral lobar, segmental, and subsegmental branches of each lobe. The right atrium and right ventricle are dilated with RV-LV ratio measuring 1.6, consistent with right heart strain. The thoracic aorta demonstrates mild atherosclerotic calcifications. No pericardial disease. Mediastinum/Nodes: No mediastinal, hilar, or axillary lymphadenopathy. Thyroid is unremarkable. Tiny hiatal hernia. Lungs/Pleura: No focal airspace consolidation. Bibasilar hypoventilatory changes. No suspicious pulmonary nodules or masses. No pleural effusion. No pneumothorax. Upper Abdomen: Tiny left hepatic cyst.  No acute findings. Musculoskeletal: No acute osseous abnormality. No suspicious lytic or blastic lesions. Review of the MIP images confirms the above findings. IMPRESSION: Acute pulmonary emboli with extensive clot burden bilaterally as described above, and CT evidence of right heart strain. Positive for acute PE with CT evidence of right heart strain (RV/LV Ratio = 1.6) consistent with at least submassive (intermediate risk) PE. The presence of right heart strain has been  associated with an increased risk of morbidity and mortality. Please refer to the "PE Focused" order set in EPIC. Critical Value/emergent results were called by telephone at the time of interpretation on 07/07/2021 at 5:40 pm to provider Merlyn Lot , who verbally acknowledged these results. Electronically Signed   By: Maurine Simmering M.D.   On: 07/07/2021 17:46   US Venous Img Lower Unilateral Left  Result Date: 07/07/2021 CLINICAL DATA:  Left lower extremity pain and swelling, history of recent knee surgery, initial encounter EXAM: LEFT LOWER EXTREMITY VENOUS DOPPLER ULTRASOUND TECHNIQUE: Gray-scale sonography with graded compression, as well as color Doppler and duplex ultrasound were performed to evaluate the lower extremity deep venous systems from the level of the common femoral vein and including the common femoral, femoral, profunda femoral, popliteal and calf veins including the posterior tibial, peroneal and gastrocnemius veins when visible. The superficial great saphenous vein was also interrogated. Spectral Doppler was utilized to evaluate flow at rest and with distal augmentation maneuvers in the common femoral, femoral and popliteal veins. COMPARISON:  None. FINDINGS: Contralateral Common Femoral Vein: Respiratory phasicity is normal and symmetric with the symptomatic side. No evidence of thrombus. Normal compressibility. Common Femoral Vein: No evidence of thrombus. Normal compressibility, respiratory phasicity and response to augmentation. Saphenofemoral Junction: No evidence of thrombus. Normal compressibility and flow on color Doppler imaging. Profunda Femoral Vein: No  evidence of thrombus. Normal compressibility and flow on color Doppler imaging. Femoral Vein: No evidence of thrombus. Normal compressibility, respiratory phasicity and response to augmentation. Popliteal Vein: Thrombus is noted with decreased compressibility. Calf Veins: Thrombus is noted within the posterior tibial vein. The  peroneal vein is not well visualized. Superficial Great Saphenous Vein: No evidence of thrombus. Normal compressibility. Venous Reflux:  None. Other Findings:  None. IMPRESSION: Thrombus is noted within the popliteal and posterior tibial veins. Electronically Signed   By: Inez Catalina M.D.   On: 07/07/2021 18:27   DG Chest Port 1 View  Result Date: 07/07/2021 CLINICAL DATA:  Clinical concern for sepsis. Four weeks status post left knee meniscal surgery. EXAM: PORTABLE CHEST 1 VIEW COMPARISON:  None. FINDINGS: Cardiac silhouette normal in size.  No mediastinal or hilar masses. Clear lungs.  No pleural effusion or pneumothorax. Skeletal structures are grossly intact. IMPRESSION: No active disease. Electronically Signed   By: Lajean Manes M.D.   On: 07/07/2021 16:41    Labs on Admission: I have personally reviewed following labs  CBC: Recent Labs  Lab 07/07/21 1615  WBC 10.8*  NEUTROABS 8.6*  HGB 13.9  HCT 40.6  MCV 94.4  PLT 846   Basic Metabolic Panel: Recent Labs  Lab 07/07/21 1615  NA 134*  K 4.0  CL 104  CO2 21*  GLUCOSE 173*  BUN 17  CREATININE 0.86  CALCIUM 9.1   GFR: Estimated Creatinine Clearance: 77.4 mL/min (by C-G formula based on SCr of 0.86 mg/dL).  Liver Function Tests: Recent Labs  Lab 07/07/21 1615  AST 23  ALT 21  ALKPHOS 91  BILITOT 0.5  PROT 7.3  ALBUMIN 3.6   Coagulation Profile: Recent Labs  Lab 07/07/21 1615  INR 1.1   Urine analysis:    Component Value Date/Time   COLORURINE Yellow 09/23/2012 0728   APPEARANCEUR Cloudy 09/23/2012 0728   LABSPEC 1.018 09/23/2012 0728   PHURINE 6.0 09/23/2012 0728   GLUCOSEU Negative 09/23/2012 0728   HGBUR 3+ 09/23/2012 0728   BILIRUBINUR Negative 09/23/2012 0728   KETONESUR Negative 09/23/2012 0728   PROTEINUR Negative 09/23/2012 0728   NITRITE Negative 09/23/2012 0728   LEUKOCYTESUR Negative 09/23/2012 0728   CRITICAL CARE Performed by: Briant Cedar Brylee Mcgreal  Total critical care time: 35  minutes  Critical care time was exclusive of separately billable procedures and treating other patients.  Critical care was necessary to treat or prevent imminent or life-threatening deterioration.  Critical care was time spent personally by me on the following activities: development of treatment plan with patient and/or surrogate as well as nursing, discussions with consultants, evaluation of patient's response to treatment, examination of patient, obtaining history from patient or surrogate, ordering and performing treatments and interventions, ordering and review of laboratory studies, ordering and review of radiographic studies, pulse oximetry and re-evaluation of patient's condition.  Dr. Tobie Poet [facility]  If 7PM-7AM, please contact overnight-coverage provider If 7AM-7PM, please contact day coverage provider [email or web address]  07/07/2021, 8:29 PM

## 2021-07-07 NOTE — ED Triage Notes (Signed)
PT Arriving today 4 weeks post L knee meniscus surgery. Pt arrives alert and oreinted with LLE braces and steristrips

## 2021-07-07 NOTE — ED Provider Notes (Signed)
[facility] Emergency Department Provider Note    Event Date/Time   First MD Initiated Contact with Patient 07/07/21 [phone]     (approximate)  I have reviewed the triage vital signs and the nursing notes.   HISTORY  Chief Complaint Code Sepsis    HPI DAROTHY Phillips is a 70 y.o. female with history of recent knee surgery presents the ER for shortness of breath nausea vomiting like she was about to faint symptoms started yesterday after she did PT.  EMS was called for shortness of breath and malaise was found to be hypoxic on 80% on room air.  Was placed on supplemental oxygen.  Code sepsis was initiated she was hypothermic tachycardic.  Does not have any history of heart or lung troubles.  Denies any worsening knee pain or swelling.  Past Medical History:  Diagnosis Date   Primary osteoarthritis    Family History  Problem Relation Age of Onset   Cancer Mother    Rheum arthritis Sister    Past Surgical History:  Procedure Laterality Date   KNEE ARTHROSCOPY Left 06/12/2021   Procedure: LEFT KNEE ARTHROSCOPY WITH DEBRIDEMENT AND REPAIR OF A POSTERIOR MEDIAL ROOT TEAR AND ABRASION [place];  Surgeon: Corky Mull, MD;  Location: [facility] ORS;  Service: Orthopedics;  Laterality: Left;   Patient Active Problem List   Diagnosis Date Noted   Pulmonary embolism ([place]) 07/07/2021   Sinus tachycardia 07/07/2021   Primary osteoarthritis 07/07/2021   Popliteal DVT (deep venous thrombosis) ([place]) 07/07/2021   Acute deep vein thrombosis (DVT) of popliteal vein of left lower extremity ([place]) 07/07/2021      Prior to Admission medications   Medication Sig Start Date End Date Taking? Authorizing Provider  aspirin EC 325 MG tablet Take 325 mg by mouth daily.   Yes [provider]  ibuprofen (ADVIL) 200 MG tablet Take 400-800 mg by mouth every 6 (six) hours as needed for mild pain or moderate pain.   Yes [provider]  traMADol (ULTRAM) 50 MG tablet  Take 1 tablet (50 mg total) by mouth every 6 (six) hours as needed for moderate pain. 06/12/21 06/12/22 Yes Poggi, Marshall Cork, MD    Allergies Penicillins and Shrimp flavor    Social History Social History   Tobacco Use   Smoking status: Never   Smokeless tobacco: Never  Substance Use Topics   Alcohol use: Yes    Alcohol/week: 1.0 standard drink    Types: 1 Glasses of wine per week    Comment: a glass at dinner   Drug use: Never    Review of Systems Patient denies headaches, rhinorrhea, blurry vision, numbness, shortness of breath, chest pain, edema, cough, abdominal pain, nausea, vomiting, diarrhea, dysuria, fevers, rashes or hallucinations unless otherwise stated above in HPI. ____________________________________________   PHYSICAL EXAM:  VITAL SIGNS: Vitals:   07/07/21 2000 07/07/21 2030  BP: (!) 174/110 (!) 161/101  Pulse: (!) 105 98  Resp: 18 15  Temp:    SpO2: 100% 100%    Constitutional: Alert and oriented.  Eyes: Conjunctivae are normal.  Head: Atraumatic. Nose: No congestion/rhinnorhea. Mouth/Throat: Mucous membranes are moist.   Neck: No stridor. Painless ROM.  Cardiovascular: borederline tachycardic regular rhythm. Grossly normal heart sounds.  Good peripheral circulation. Respiratory: Normal respiratory effort.  No retractions. Lungs CTAB. Gastrointestinal: Soft and nontender. No distention. No abdominal bruits. No CVA tenderness. Genitourinary: deferred Musculoskeletal: LLE swelling, dp and pt pulses palpable.  No joint effusions. Neurologic:  Normal  speech and language. No gross focal neurologic deficits are appreciated. No facial droop Skin:  Skin is warm, dry and intact. No rash noted. Psychiatric: Mood and affect are normal. Speech and behavior are normal.  ____________________________________________   LABS (all labs ordered are listed, but only abnormal results are displayed)  Results for orders placed or performed during the hospital  encounter of 07/07/21 (from the past 24 hour(s))  Resp Panel by RT-PCR (Flu A&B, Covid) Nasopharyngeal Swab     Status: None   Collection Time: 07/07/21  4:15 PM   Specimen: Nasopharyngeal Swab; Nasopharyngeal(NP) swabs in vial transport medium  Result Value Ref Range   SARS Coronavirus 2 by RT PCR NEGATIVE NEGATIVE   Influenza A by PCR NEGATIVE NEGATIVE   Influenza B by PCR NEGATIVE NEGATIVE  Lactic acid, plasma     Status: Abnormal   Collection Time: 07/07/21  4:15 PM  Result Value Ref Range   Lactic Acid, Venous 3.1 (HH) 0.5 - 1.9 mmol/L  Comprehensive metabolic panel     Status: Abnormal   Collection Time: 07/07/21  4:15 PM  Result Value Ref Range   Sodium 134 (L) 135 - 145 mmol/L   Potassium 4.0 3.5 - 5.1 mmol/L   Chloride 104 98 - 111 mmol/L   CO2 21 (L) 22 - 32 mmol/L   Glucose, Bld 173 (H) 70 - 99 mg/dL   BUN 17 8 - 23 mg/dL   Creatinine, Ser 0.86 0.44 - 1.00 mg/dL   Calcium 9.1 8.9 - 10.3 mg/dL   Total Protein 7.3 6.5 - 8.1 g/dL   Albumin 3.6 3.5 - 5.0 g/dL   AST 23 15 - 41 U/L   ALT 21 0 - 44 U/L   Alkaline Phosphatase 91 38 - 126 U/L   Total Bilirubin 0.5 0.3 - 1.2 mg/dL   GFR, Estimated >60 >60 mL/min   Anion gap 9 5 - 15  CBC WITH DIFFERENTIAL     Status: Abnormal   Collection Time: 07/07/21  4:15 PM  Result Value Ref Range   WBC 10.8 (H) 4.0 - 10.5 K/uL   RBC 4.30 3.87 - 5.11 MIL/uL   Hemoglobin 13.9 12.0 - 15.0 g/dL   HCT 40.6 36.0 - 46.0 %   MCV 94.4 80.0 - 100.0 fL   MCH 32.3 26.0 - 34.0 pg   MCHC 34.2 30.0 - 36.0 g/dL   RDW 13.1 11.5 - 15.5 %   Platelets 185 150 - 400 K/uL   nRBC 0.0 0.0 - 0.2 %   Neutrophils Relative % 80 %   Neutro Abs 8.6 (H) 1.7 - 7.7 K/uL   Lymphocytes Relative 12 %   Lymphs Abs 1.3 0.7 - 4.0 K/uL   Monocytes Relative 7 %   Monocytes Absolute 0.7 0.1 - 1.0 K/uL   Eosinophils Relative 0 %   Eosinophils Absolute 0.0 0.0 - 0.5 K/uL   Basophils Relative 0 %   Basophils Absolute 0.0 0.0 - 0.1 K/uL   Immature Granulocytes 1 %    Abs Immature Granulocytes 0.06 0.00 - 0.07 K/uL  Protime-INR     Status: None   Collection Time: 07/07/21  4:15 PM  Result Value Ref Range   Prothrombin Time 14.6 11.4 - 15.2 seconds   INR 1.1 0.8 - 1.2  APTT     Status: None   Collection Time: 07/07/21  4:15 PM  Result Value Ref Range   aPTT 28 24 - 36 seconds  Troponin I (High Sensitivity)  Status: Abnormal   Collection Time: 07/07/21  4:15 PM  Result Value Ref Range   Troponin I (High Sensitivity) 491 (HH) <18 ng/L  Brain natriuretic peptide     Status: Abnormal   Collection Time: 07/07/21  4:15 PM  Result Value Ref Range   B Natriuretic Peptide 292.6 (H) 0.0 - 100.0 pg/mL  Troponin I (High Sensitivity)     Status: Abnormal   Collection Time: 07/07/21  5:15 PM  Result Value Ref Range   Troponin I (High Sensitivity) 970 (HH) <18 ng/L  Lactic acid, plasma     Status: Abnormal   Collection Time: 07/07/21  6:20 PM  Result Value Ref Range   Lactic Acid, Venous 2.4 (HH) 0.5 - 1.9 mmol/L   ____________________________________________  EKG My review and personal interpretation at Time: 16:08   Indication: sob  Rate: 110  Rhythm: sinus Axis: normal Other: nonspecific st abn, no stemi ____________________________________________  RADIOLOGY  I personally reviewed all radiographic images ordered to evaluate for the above acute complaints and reviewed radiology reports and findings.  These findings were personally discussed with the patient.  Please see medical record for radiology report.  ____________________________________________   PROCEDURES  Procedure(s) performed:  .Critical Care Performed by: Merlyn Lot, MD Authorized by: Merlyn Lot, MD   Critical care provider statement:    Critical care time (minutes):  35   Critical care was necessary to treat or prevent imminent or life-threatening deterioration of the following conditions:  Circulatory failure and respiratory failure   Critical care was time  spent personally by me on the following activities:  Ordering and performing treatments and interventions, ordering and review of laboratory studies, ordering and review of radiographic studies, pulse oximetry, re-evaluation of patient's condition, review of old charts, obtaining history from patient or surrogate, examination of patient, evaluation of patient's response to treatment, discussions with primary provider, discussions with consultants and development of treatment plan with patient or surrogate    Critical Care performed: yes ____________________________________________   INITIAL IMPRESSION / [place] / ED COURSE  Pertinent labs & imaging results that were available during my care of the patient were reviewed by me and considered in my medical decision making (see chart for details).   DDX: Asthma, copd, CHF, pna, ptx, malignancy, Pe, anemia   Sandra Phillips is a 70 y.o. who presents to the ED with presentation as described above.  On arrival patient pleasant well-appearing no acute distress mildly tachycardic.  Given presentation although she was called code sepsis in route a more concerned about possible PE or noncardiac etiology as she is recently postop.  Clinical Course as of 07/07/21 1830  Sat Jul 07, 2021  1712 Blood work thus far is reassuring.  Patient clinically appears well at this time is complaining some mild chest discomfort.  Will order CTA as well as venous duplex of her left leg. [PR]  1820 My review of CT does show evidence of bilateral pulmonary embolism.  CT imaging report shows large volume of pulmonary emboli with right heart strain.  Troponin is elevated.  She is otherwise well-appearing will place on heparin.  Discussed case in consultation with Dr. Lanney Gins of intensivist service agrees with plan for heparinization, hold tpa unless vitals become unstable. Will consult vascular surgery.  Will heparinize.  Will discuss with hospitalist for admission.  [PR]    Clinical Course User Index [PR] Merlyn Lot, MD    The patient was evaluated in Emergency Department today for the symptoms  described in the history of present illness. He/she was evaluated in the context of the global COVID-19 pandemic, which necessitated consideration that the patient might be at risk for infection with the SARS-CoV-2 virus that causes COVID-19. Institutional protocols and algorithms that pertain to the evaluation of patients at risk for COVID-19 are in a state of rapid change based on information released by regulatory bodies including the CDC and federal and state organizations. These policies and algorithms were followed during the patient's care in the ED.  As part of my medical decision making, I reviewed the following data within the [place] notes reviewed and incorporated, Labs reviewed, notes from prior ED visits and [place] Controlled Substance Database   ____________________________________________   FINAL CLINICAL IMPRESSION(S) / ED DIAGNOSES  Final diagnoses:  Other acute pulmonary embolism with acute cor pulmonale (HCC)      NEW MEDICATIONS STARTED DURING THIS VISIT:  New Prescriptions   No medications on file     Note:  This document was prepared using Dragon voice recognition software and may include unintentional dictation errors.    Merlyn Lot, MD 07/07/21 2153

## 2021-07-07 NOTE — ED Notes (Signed)
Informed RN bed assigned 

## 2021-07-08 ENCOUNTER — Inpatient Hospital Stay
Admit: 2021-07-08 | Discharge: 2021-07-08 | Disposition: A | Discharge status: Home / Self Care | Payer: Medicare Other | Attending: Internal Medicine | Admitting: Internal Medicine

## 2021-07-08 LAB — ECHOCARDIOGRAM COMPLETE
AV Peak grad: 4.5 mmHg
Ao pk vel: 1.06 m/s
Area-P 1/2: 8.34 cm2
Height: 68 in
S' Lateral: 2.8 cm
Weight: 3520 oz

## 2021-07-08 LAB — HEPARIN LEVEL (UNFRACTIONATED)
Heparin Unfractionated: 0.46 IU/mL (ref 0.30–0.70)
Heparin Unfractionated: 0.55 IU/mL (ref 0.30–0.70)
Heparin Unfractionated: 0.86 IU/mL — ABNORMAL HIGH (ref 0.30–0.70)

## 2021-07-08 LAB — CBC
HCT: 39.3 % (ref 36.0–46.0)
Hemoglobin: 13.6 g/dL (ref 12.0–15.0)
MCH: 32.4 pg (ref 26.0–34.0)
MCHC: 34.6 g/dL (ref 30.0–36.0)
MCV: 93.6 fL (ref 80.0–100.0)
Platelets: 173 10*3/uL (ref 150–400)
RBC: 4.2 MIL/uL (ref 3.87–5.11)
RDW: 13.2 % (ref 11.5–15.5)
WBC: 9.5 10*3/uL (ref 4.0–10.5)
nRBC: 0 % (ref 0.0–0.2)

## 2021-07-08 LAB — BASIC METABOLIC PANEL
Anion gap: 7 (ref 5–15)
BUN: 12 mg/dL (ref 8–23)
CO2: 22 mmol/L (ref 22–32)
Calcium: 8.6 mg/dL — ABNORMAL LOW (ref 8.9–10.3)
Chloride: 111 mmol/L (ref 98–111)
Creatinine, Ser: 0.74 mg/dL (ref 0.44–1.00)
GFR, Estimated: >60 mL/min (ref >60)
Glucose, Bld: 110 mg/dL — ABNORMAL HIGH (ref 70–99)
Potassium: 4.2 mmol/L (ref 3.5–5.1)
Sodium: 140 mmol/L (ref 135–145)

## 2021-07-08 LAB — HIV ANTIBODY (ROUTINE TESTING W REFLEX): HIV Screen 4th Generation wRfx: NONREACTIVE

## 2021-07-08 LAB — TROPONIN I (HIGH SENSITIVITY)
Troponin I (High Sensitivity): 419 ng/L (ref <18)
Troponin I (High Sensitivity): 278 ng/L (ref <18)

## 2021-07-08 MED ORDER — CHLORHEXIDINE GLUCONATE CLOTH 2 % EX PADS: 6.0000 | MEDICATED_PAD | Freq: Every day | CUTANEOUS | Status: DC

## 2021-07-08 MED ORDER — PERFLUTREN LIPID MICROSPHERE
1.0000 mL | INTRAVENOUS | Status: AC | PRN
Start: 2021-07-08 — End: 2021-07-08
  Administered 2021-07-08: 08:00:00 5 mL via INTRAVENOUS
  Filled 2021-07-08: qty 10

## 2021-07-08 MED ORDER — BLISTEX MEDICATED EX OINT
TOPICAL_OINTMENT | CUTANEOUS | Status: DC | PRN
  Filled 2021-07-08 (×2): qty 6.3

## 2021-07-08 NOTE — Progress Notes (Signed)
ANTICOAGULATION CONSULT NOTE  Pharmacy Consult for heparin infusion Indication: pulmonary embolus  Allergies  Allergen Reactions   Penicillins     Unknown childhood reaction    Shrimp Flavor Swelling    Patient Measurements: Height: 5\' 8"  (172.7 cm) Weight: 99.8 kg (220 lb) IBW/kg (Calculated) : 63.9 Heparin Dosing Weight: 86.5 kg  Vital Signs: Temp: 97.7 F (36.5 C) (12/18 1600) Temp Source: Oral (12/18 1600) BP: 133/94 (12/18 1600) Pulse Rate: 85 (12/18 1600)  Labs: Recent Labs    07/07/21 1615 07/07/21 1715 07/07/21 2349 07/08/21 0429 07/08/21 0946 07/08/21 1606  HGB 13.9  --   --  13.6  --   --   HCT 40.6  --   --  39.3  --   --   PLT 185  --   --  173  --   --   APTT 28  --   --   --   --   --   LABPROT 14.6  --   --   --   --   --   INR 1.1  --   --   --   --   --   HEPARINUNFRC  --   --  0.86*  --  0.55 0.46  CREATININE 0.86  --   --  0.74  --   --   TROPONINIHS 491* 970*  --  419* 278*  --      Estimated Creatinine Clearance: 83.2 mL/min (by C-G formula based on SCr of 0.74 mg/dL).   Medical History: Past Medical History:  Diagnosis Date   Primary osteoarthritis     Medications:  Per chart review, no anticoagulation noted prior to admission  Assessment: 70 y.o. female with history of recent knee surgery presents the ER for shortness of breath. Pharmacy has been consulted for heparin dosing/monitoring. Baseline CBC WNL.   12/18 0946 HL 0.55  12/18 1606 HL 0.46  Goal of Therapy:  Heparin level 0.3-0.7 units/ml Monitor platelets by anticoagulation protocol: Yes   Plan:  Heparin level is therapeutic. Will continue heparin infusion at 1250 units/hr. Recheck heparin level with AM labs CBC daily while on heparin.   Dorothe Pea, PharmD, BCPS Clinical Pharmacist   07/08/2021 4:37 PM

## 2021-07-08 NOTE — Progress Notes (Signed)
ANTICOAGULATION CONSULT NOTE  Pharmacy Consult for heparin infusion Indication: pulmonary embolus  Allergies  Allergen Reactions   Penicillins     Unknown childhood reaction    Shrimp Flavor Swelling    Patient Measurements: Height: 5\' 8"  (172.7 cm) Weight: 99.8 kg (220 lb) IBW/kg (Calculated) : 63.9 Heparin Dosing Weight: 86.5 kg  Vital Signs: Temp: 98.6 F (37 C) (12/18 0750) Temp Source: Oral (12/18 0750) BP: 128/89 (12/18 1000) Pulse Rate: 92 (12/18 1000)  Labs: Recent Labs    07/07/21 1615 07/07/21 1715 07/07/21 2349 07/08/21 0429 07/08/21 0946  HGB 13.9  --   --  13.6  --   HCT 40.6  --   --  39.3  --   PLT 185  --   --  173  --   APTT 28  --   --   --   --   LABPROT 14.6  --   --   --   --   INR 1.1  --   --   --   --   HEPARINUNFRC  --   --  0.86*  --  0.55  CREATININE 0.86  --   --  0.74  --   TROPONINIHS 491* 970*  --  419* 278*     Estimated Creatinine Clearance: 83.2 mL/min (by C-G formula based on SCr of 0.74 mg/dL).   Medical History: Past Medical History:  Diagnosis Date   Primary osteoarthritis     Medications:  Per chart review, no anticoagulation noted prior to admission  Assessment: 70 y.o. female with history of recent knee surgery presents the ER for shortness of breath. Pharmacy has been consulted for heparin dosing/monitoring. Baseline CBC WNL.   12/18 0946 HL 0.55   Goal of Therapy:  Heparin level 0.3-0.7 units/ml Monitor platelets by anticoagulation protocol: Yes   Plan:  Heparin level is therapeutic. Will continue heparin infusion at 1250 units/hr. Recheck heparin level in 6 hours. CBC daily while on heparin.   Eleonore Chiquito, PharmD, BCPS 07/08/2021 11:03 AM

## 2021-07-08 NOTE — Progress Notes (Signed)
ANTICOAGULATION CONSULT NOTE  Pharmacy Consult for heparin infusion Indication: pulmonary embolus  Allergies  Allergen Reactions   Penicillins     Unknown childhood reaction    Shrimp Flavor Swelling    Patient Measurements: Height: 5\' 8"  (172.7 cm) Weight: 99.8 kg (220 lb) IBW/kg (Calculated) : 63.9 Heparin Dosing Weight: 86.5 kg  Vital Signs: Temp: 97.8 F (36.6 C) (12/17 1611) Temp Source: Oral (12/17 1611) BP: 138/93 (12/18 0100) Pulse Rate: 94 (12/18 0100)  Labs: Recent Labs    07/07/21 1615 07/07/21 1715 07/07/21 2349  HGB 13.9  --   --   HCT 40.6  --   --   PLT 185  --   --   APTT 28  --   --   LABPROT 14.6  --   --   INR 1.1  --   --   HEPARINUNFRC  --   --  0.86*  CREATININE 0.86  --   --   TROPONINIHS 491* 970*  --      Estimated Creatinine Clearance: 77.4 mL/min (by C-G formula based on SCr of 0.86 mg/dL).   Medical History: Past Medical History:  Diagnosis Date   Primary osteoarthritis     Medications:  Per chart review, no anticoagulation noted prior to admission  Assessment: 70 y.o. female with history of recent knee surgery presents the ER for shortness of breath. Pharmacy has been consulted for heparin dosing/monitoring. Baseline CBC WNL.   Goal of Therapy:  Heparin level 0.3-0.7 units/ml Monitor platelets by anticoagulation protocol: Yes  12/17 2349 HL 0.86, supratherapeutic   Plan:  Decrease heparin infusion to 1250 units/hr Recheck HL in 6 hours after rate change Continue to monitor H&H and platelets  Renda Rolls, PharmD, [facility] 07/08/2021 1:52 AM

## 2021-07-08 NOTE — Progress Notes (Signed)
*  PRELIMINARY RESULTS* Echocardiogram 2D Echocardiogram has been performed. Definity IV Contrast used on this study.  Sandra Phillips 07/08/2021, 9:25 AM

## 2021-07-08 NOTE — Consult Note (Signed)
PULMONOLOGY         Date: 07/08/2021,   MRN# [id number] Sandra Phillips 12-Aug-1950     AdmissionWeight: 99.8 kg                 CurrentWeight: 99.8 kg   Referring physician: Dr Priscella Andrada   CHIEF COMPLAINT:   Bilateral pulmonary venous thromboembolism   HISTORY OF PRESENT ILLNESS   This is a very pleasant 70 yo F with hx of OA who came in acutely sob and was found to have tachycardia.  Chest imaging was done and found to have bilateral PE.  She was not unstable and vital signs remained normal with o2 requirement of 2L/min supplemental oxygen via nasal canula.  She had cardiac biomarkers done with mild elevation and CT evidence of component of Right heart strain.  I discussed case with ED doctor and [facility] hospitalisit and we opted to not use tPA due to clinical stability.  I reviewed imaging with patient in detail We discussed options and agree on having vascular surgery evaluation due to high clot burden evidenced by CT chest.    PAST MEDICAL HISTORY   Past Medical History:  Diagnosis Date   Primary osteoarthritis      SURGICAL HISTORY   Past Surgical History:  Procedure Laterality Date   KNEE ARTHROSCOPY Left 06/12/2021   Procedure: LEFT KNEE ARTHROSCOPY WITH DEBRIDEMENT AND REPAIR OF A POSTERIOR MEDIAL ROOT TEAR AND ABRASION CONDOPLASTY;  Surgeon: Corky Mull, MD;  Location: [facility];  Service: Orthopedics;  Laterality: Left;     FAMILY HISTORY   Family History  Problem Relation Age of Onset   Cancer Mother    Rheum arthritis Sister      SOCIAL HISTORY   Social History   Tobacco Use   Smoking status: Never   Smokeless tobacco: Never  Substance Use Topics   Alcohol use: Yes    Alcohol/week: 1.0 standard drink    Types: 1 Glasses of wine per week    Comment: a glass at dinner   Drug use: Never     MEDICATIONS    Home Medication:    Current Medication:  Current Facility-Administered Medications:    acetaminophen (TYLENOL)  tablet 650 mg, 650 mg, Oral, Q6H PRN **OR** acetaminophen (TYLENOL) suppository 650 mg, 650 mg, Rectal, Q6H PRN, Cox, Amy N, DO   Chlorhexidine Gluconate Cloth 2 % PADS 6 each, 6 each, Topical, Daily, Sreenath, Sudheer B, MD   heparin ADULT infusion 100 units/mL (25000 units/255mL), 1,250 Units/hr, Intravenous, Continuous, Belue, Alver Sorrow, RPH, Last Rate: 12.5 mL/hr at 07/08/21 0800, 1,250 Units/hr at 07/08/21 0800   hydrALAZINE (APRESOLINE) injection 10 mg, 10 mg, Intravenous, Q6H PRN, Cox, Amy N, DO   lip balm (BLISTEX) ointment, , Topical, PRN, Sreenath, Sudheer B, MD   ondansetron (ZOFRAN) tablet 4 mg, 4 mg, Oral, Q6H PRN **OR** ondansetron (ZOFRAN) injection 4 mg, 4 mg, Intravenous, Q6H PRN, Cox, Amy N, DO   traMADol (ULTRAM) tablet 50 mg, 50 mg, Oral, Q6H PRN, Cox, Amy N, DO  Facility-Administered Medications Ordered in Other Encounters:    perflutren lipid microspheres (DEFINITY) IV suspension, 1-10 mL, Intravenous, PRN, Cox, Amy N, DO, 5 mL at 07/08/21 0805    ALLERGIES   Penicillins and Shrimp flavor     REVIEW OF SYSTEMS    Review of Systems:  Gen:  Denies  fever, sweats, chills weigh loss  HEENT: Denies blurred vision, double vision, ear pain, eye pain, hearing loss, nose  bleeds, sore throat Cardiac:  No dizziness, chest pain or heaviness, chest tightness,edema Resp:   Denies cough or sputum porduction, shortness of breath,wheezing, hemoptysis,  Gi: Denies swallowing difficulty, stomach pain, nausea or vomiting, diarrhea, constipation, bowel incontinence Gu:  Denies bladder incontinence, burning urine Ext:   Denies Joint pain, stiffness or swelling Skin: Denies  skin rash, easy bruising or bleeding or hives Endoc:  Denies polyuria, polydipsia , polyphagia or weight change Psych:   Denies depression, insomnia or hallucinations   Other:  All other systems negative   VS: BP 136/84    Pulse 92    Temp 98.6 F (37 C) (Oral)    Resp 18    Ht 5\' 8"  (1.727 m)    Wt  99.8 kg    SpO2 96%    BMI 33.45 kg/m      PHYSICAL EXAM    GENERAL:NAD, no fevers, chills, no weakness no fatigue HEAD: Normocephalic, atraumatic.  EYES: Pupils equal, round, reactive to light. Extraocular muscles intact. No scleral icterus.  MOUTH: Moist mucosal membrane. Dentition intact. No abscess noted.  EAR, NOSE, THROAT: Clear without exudates. No external lesions.  NECK: Supple. No thyromegaly. No nodules. No JVD.  PULMONARY:clear to auscultation bilaterally  CARDIOVASCULAR: S1 and S2. Regular rate and rhythm. No murmurs, rubs, or gallops. No edema. Pedal pulses 2+ bilaterally.  GASTROINTESTINAL: Soft, nontender, nondistended. No masses. Positive bowel sounds. No hepatosplenomegaly.  MUSCULOSKELETAL: No swelling, clubbing, or edema. Range of motion full in all extremities.  NEUROLOGIC: Cranial nerves II through XII are intact. No gross focal neurological deficits. Sensation intact. Reflexes intact.  SKIN: No ulceration, lesions, rashes, or cyanosis. Skin warm and dry. Turgor intact.  PSYCHIATRIC: Mood, affect within normal limits. The patient is awake, alert and oriented x 3. Insight, judgment intact.       IMAGING    CT Angio Chest PE W and/or Wo Contrast  Result Date: 07/07/2021 CLINICAL DATA:  Pulmonary embolism (PE) suspected, high prob EXAM: CT ANGIOGRAPHY CHEST WITH CONTRAST TECHNIQUE: Multidetector CT imaging of the chest was performed using the standard protocol during bolus administration of intravenous contrast. Multiplanar CT image reconstructions and MIPs were obtained to evaluate the vascular anatomy. CONTRAST:  74mL OMNIPAQUE IOHEXOL 350 MG/ML SOLN COMPARISON:  None. FINDINGS: Cardiovascular: Positive for pulmonary emboli. There is a large clot burden within the right and left branch pulmonary arteries, extending into the bilateral lobar, segmental, and subsegmental branches of each lobe. The right atrium and right ventricle are dilated with RV-LV ratio measuring  1.6, consistent with right heart strain. The thoracic aorta demonstrates mild atherosclerotic calcifications. No pericardial disease. Mediastinum/Nodes: No mediastinal, hilar, or axillary lymphadenopathy. Thyroid is unremarkable. Tiny hiatal hernia. Lungs/Pleura: No focal airspace consolidation. Bibasilar hypoventilatory changes. No suspicious pulmonary nodules or masses. No pleural effusion. No pneumothorax. Upper Abdomen: Tiny left hepatic cyst.  No acute findings. Musculoskeletal: No acute osseous abnormality. No suspicious lytic or blastic lesions. Review of the MIP images confirms the above findings. IMPRESSION: Acute pulmonary emboli with extensive clot burden bilaterally as described above, and CT evidence of right heart strain. Positive for acute PE with CT evidence of right heart strain (RV/LV Ratio = 1.6) consistent with at least submassive (intermediate risk) PE. The presence of right heart strain has been associated with an increased risk of morbidity and mortality. Please refer to the "PE Focused" order set in EPIC. Critical Value/emergent results were called by telephone at the time of interpretation on 07/07/2021 at 5:40 pm to provider PATRICK  ROBINSON , who verbally acknowledged these results. Electronically Signed   By: Maurine Simmering M.D.   On: 07/07/2021 17:46   US Venous Img Lower Unilateral Left  Result Date: 07/07/2021 CLINICAL DATA:  Left lower extremity pain and swelling, history of recent knee surgery, initial encounter EXAM: LEFT LOWER EXTREMITY VENOUS DOPPLER ULTRASOUND TECHNIQUE: Gray-scale sonography with graded compression, as well as color Doppler and duplex ultrasound were performed to evaluate the lower extremity deep venous systems from the level of the common femoral vein and including the common femoral, femoral, profunda femoral, popliteal and calf veins including the posterior tibial, peroneal and gastrocnemius veins when visible. The superficial great saphenous vein was also  interrogated. Spectral Doppler was utilized to evaluate flow at rest and with distal augmentation maneuvers in the common femoral, femoral and popliteal veins. COMPARISON:  None. FINDINGS: Contralateral Common Femoral Vein: Respiratory phasicity is normal and symmetric with the symptomatic side. No evidence of thrombus. Normal compressibility. Common Femoral Vein: No evidence of thrombus. Normal compressibility, respiratory phasicity and response to augmentation. Saphenofemoral Junction: No evidence of thrombus. Normal compressibility and flow on color Doppler imaging. Profunda Femoral Vein: No evidence of thrombus. Normal compressibility and flow on color Doppler imaging. Femoral Vein: No evidence of thrombus. Normal compressibility, respiratory phasicity and response to augmentation. Popliteal Vein: Thrombus is noted with decreased compressibility. Calf Veins: Thrombus is noted within the posterior tibial vein. The peroneal vein is not well visualized. Superficial Great Saphenous Vein: No evidence of thrombus. Normal compressibility. Venous Reflux:  None. Other Findings:  None. IMPRESSION: Thrombus is noted within the popliteal and posterior tibial veins. Electronically Signed   By: Inez Catalina M.D.   On: 07/07/2021 18:27   DG Chest Port 1 View  Result Date: 07/07/2021 CLINICAL DATA:  Clinical concern for sepsis. Four weeks status post left knee meniscal surgery. EXAM: PORTABLE CHEST 1 VIEW COMPARISON:  None. FINDINGS: Cardiac silhouette normal in size.  No mediastinal or hilar masses. Clear lungs.  No pleural effusion or pneumothorax. Skeletal structures are grossly intact. IMPRESSION: No active disease. Electronically Signed   By: Lajean Manes M.D.   On: 07/07/2021 16:41      ASSESSMENT/PLAN   Bilateral PE and DVT   - patient is stable but with extensive thrombosis bilaterally    - vital signs remain adequate and with mild hypoxemia   - Simlified PESI - 8.9% death risk    -recommendation for  vascular surgery evaluation for possible IVC filter and thrombectomy   -reviewed imaging with patient in detail , patient wishes to have surgical intervention to help remove blood clots    Thank you for allowing me to participate in the care of this patient.  Total face to face encounter time for this patient visit was >5min. >50% of the time was  spent in counseling and coordination of care.   Patient/Family are satisfied with care plan and all questions have been answered.  This document was prepared using Dragon voice recognition software and may include unintentional dictation errors.     Ottie Glazier, M.D.  Division of [facility]

## 2021-07-08 NOTE — Progress Notes (Signed)
PROGRESS NOTE    Sandra Phillips  UXN:[id number] DOB: January 03, 1951 DOA: 07/07/2021 PCP: [facility] [place] (Inactive)    Brief Narrative:  70 y.o. female with medical history significant for primary osteoarthritis of the left knee, complex tear of the medial meniscus of the left knee status post left knee arthroscopy with debridement and repair of the posterior medial root tear and abrasion  chondoplasty who presents to the emergency department from home for chief concerns of shortness of breath.   She noticed swelling of her left foot on Tuesday and/or Wednesday (07/04/21).  She endorses compliance with aspirin 325 mg daily.  She states she she has not missed a single dose.  She reports that since the operation, she is mainly sitting in a chair and/or in bed laying down as movement is difficult for her.   She states that she noticed shortness of breath that started approximately 5:30 PM on 12/16 after an intense first physical therapy session. She she states that the shortness of breath improved with resting at home and she slept well on evening of 07/06/2021. Today, she noticed worsening dyspnea with exertion, worsening shortness of breath with rest, diaphoretic, and nausea and vomiting.  She only had Pedialyte today prior to presentation to the ED.   These acute symptoms prompted her to present to the emergency department for further evaluation.  CT angio chest significant for bilateral multifocal pulmonary emboli with CT evidence of right heart strain.  Left lower extremity significant for DVT.   Assessment & Plan:   Principal Problem:   Pulmonary embolism (HCC) Active Problems:   Sinus tachycardia   Primary osteoarthritis   Popliteal DVT (deep venous thrombosis) (HCC)   Acute deep vein thrombosis (DVT) of popliteal vein of left lower extremity (HCC)  Bilateral pulmonary embolism, submassive with right heart strain Acute lower extremity DVT and popliteal and  posterior tibial veins Presumably secondary to recent orthopedic intervention Orthopedic repair on 11/22 Patient was on aspirin 325 daily for 6 weeks as DVT prophylaxis Hemodynamically stable, on 2 L Plan: TTE Heparin GTT Vascular and pulmonary consults Telemetry monitoring N.p.o. after midnight 12/18 in anticipation of pulmonary thrombectomy tomorrow  Sinus tachycardia Improved Presumed secondary to PE  Recent knee surgery No acute orthopedic issues    DVT prophylaxis: Heparin GTT Code Status: Full Family Communication: None today Disposition Plan: Status is: Inpatient  Remains inpatient appropriate because: Acute PE on heparin gtt.       Level of care: Telemetry Medical  Consultants:  Vascular surgery  pulmonology  Procedures:  None  Antimicrobials: None   Subjective: Seen and examined.  Resting comfortably in bed.  No visible distress.  Normal work of breathing.  No pain complaints  Objective: Vitals:   07/08/21 0800 07/08/21 0900 07/08/21 1000 07/08/21 1100  BP: 136/84 (!) 137/96 128/89 112/83  Pulse: 92 92 92 84  Resp: 18 (!) 22 16 12   Temp:    98.5 F (36.9 C)  TempSrc:    Oral  SpO2: 96% 96% 96% 97%  Weight:      Height:        Intake/Output Summary (Last 24 hours) at 07/08/2021 1331 Last data filed at 07/08/2021 1206 Gross per 24 hour  Intake 777.29 ml  Output 600 ml  Net 177.29 ml   Filed Weights   07/07/21 1607  Weight: 99.8 kg    Examination:  General exam: Appears calm and comfortable  Respiratory system: Clear to auscultation. Respiratory effort normal. Cardiovascular  system: S1-S2, RRR, no murmurs, no pedal edema Gastrointestinal system: Abdomen is nondistended, soft and nontender. No organomegaly or masses felt. Normal bowel sounds heard. Central nervous system: Alert and oriented. No focal neurological deficits. Extremities: Symmetric 5 x 5 power.  Left lower extremity slightly swollen Skin: No rashes, lesions or  ulcers Psychiatry: Judgement and insight appear normal. Mood & affect appropriate.     Data Reviewed: I have personally reviewed following labs and imaging studies  CBC: Recent Labs  Lab 07/07/21 1615 07/08/21 0429  WBC 10.8* 9.5  NEUTROABS 8.6*  --   HGB 13.9 13.6  HCT 40.6 39.3  MCV 94.4 93.6  PLT 185 510   Basic Metabolic Panel: Recent Labs  Lab 07/07/21 1615 07/08/21 0429  NA 134* 140  K 4.0 4.2  CL 104 111  CO2 21* 22  GLUCOSE 173* 110*  BUN 17 12  CREATININE 0.86 0.74  CALCIUM 9.1 8.6*   GFR: Estimated Creatinine Clearance: 83.2 mL/min (by C-G formula based on SCr of 0.74 mg/dL). Liver Function Tests: Recent Labs  Lab 07/07/21 1615  AST 23  ALT 21  ALKPHOS 91  BILITOT 0.5  PROT 7.3  ALBUMIN 3.6   No results for input(s): LIPASE, AMYLASE in the last 168 hours. No results for input(s): AMMONIA in the last 168 hours. Coagulation Profile: Recent Labs  Lab 07/07/21 1615  INR 1.1   Cardiac Enzymes: No results for input(s): CKTOTAL, CKMB, CKMBINDEX, TROPONINI in the last 168 hours. BNP (last 3 results) No results for input(s): PROBNP in the last 8760 hours. HbA1C: No results for input(s): HGBA1C in the last 72 hours. CBG: No results for input(s): GLUCAP in the last 168 hours. Lipid Profile: No results for input(s): CHOL, HDL, LDLCALC, TRIG, CHOLHDL, LDLDIRECT in the last 72 hours. Thyroid Function Tests: No results for input(s): TSH, T4TOTAL, FREET4, T3FREE, THYROIDAB in the last 72 hours. Anemia Panel: No results for input(s): VITAMINB12, FOLATE, FERRITIN, TIBC, IRON, RETICCTPCT in the last 72 hours. Sepsis Labs: Recent Labs  Lab 07/07/21 1615 07/07/21 1820  LATICACIDVEN 3.1* 2.4*    Recent Results (from the past 240 hour(s))  Resp Panel by RT-PCR (Flu A&B, Covid) Nasopharyngeal Swab     Status: None   Collection Time: 07/07/21  4:15 PM   Specimen: Nasopharyngeal Swab; Nasopharyngeal(NP) swabs in vial transport medium  Result Value Ref  Range Status   SARS Coronavirus 2 by RT PCR NEGATIVE NEGATIVE Final    Comment: (NOTE) SARS-CoV-2 target nucleic acids are NOT DETECTED.  The SARS-CoV-2 RNA is generally detectable in upper respiratory specimens during the acute phase of infection. The lowest concentration of SARS-CoV-2 viral copies this assay can detect is 138 copies/mL. A negative result does not preclude SARS-Cov-2 infection and should not be used as the sole basis for treatment or other patient management decisions. A negative result may occur with  improper specimen collection/handling, submission of specimen other than nasopharyngeal swab, presence of viral mutation(s) within the areas targeted by this assay, and inadequate number of viral copies(<138 copies/mL). A negative result must be combined with clinical observations, patient history, and epidemiological information. The expected result is Negative.  Fact Sheet for Patients:  EntrepreneurPulse.com.au  Fact Sheet for Healthcare Providers:  IncredibleEmployment.be  This test is no t yet approved or cleared by the [place] FDA and  has been authorized for detection and/or diagnosis of SARS-CoV-2 by FDA under an Emergency Use Authorization (EUA). This EUA will remain  in effect (meaning this test can  be used) for the duration of the COVID-19 declaration under Section 564(b)(1) of the Act, 21 U.S.C.section 360bbb-3(b)(1), unless the authorization is terminated  or revoked sooner.       Influenza A by PCR NEGATIVE NEGATIVE Final   Influenza B by PCR NEGATIVE NEGATIVE Final    Comment: (NOTE) The Xpert Xpress SARS-CoV-2/FLU/RSV plus assay is intended as an aid in the diagnosis of influenza from Nasopharyngeal swab specimens and should not be used as a sole basis for treatment. Nasal washings and aspirates are unacceptable for Xpert Xpress SARS-CoV-2/FLU/RSV testing.  Fact Sheet for  Patients: EntrepreneurPulse.com.au  Fact Sheet for Healthcare Providers: IncredibleEmployment.be  This test is not yet approved or cleared by the [place] FDA and has been authorized for detection and/or diagnosis of SARS-CoV-2 by FDA under an Emergency Use Authorization (EUA). This EUA will remain in effect (meaning this test can be used) for the duration of the COVID-19 declaration under Section 564(b)(1) of the Act, 21 U.S.C. section 360bbb-3(b)(1), unless the authorization is terminated or revoked.  Performed at [facility], [address], [place], [place] [address]   Blood Culture (routine x 2)     Status: None (Preliminary result)   Collection Time: 07/07/21  4:15 PM   Specimen: BLOOD  Result Value Ref Range Status   Specimen Description BLOOD BLOOD RIGHT ARM  Final   Special Requests   Final    BOTTLES DRAWN AEROBIC AND ANAEROBIC Blood Culture results may not be optimal due to an inadequate volume of blood received in culture bottles   Culture   Final    NO GROWTH < 24 HOURS Performed at [facility], [address] [place], [place] [address]    Report Status PENDING  Incomplete  Blood Culture (routine x 2)     Status: None (Preliminary result)   Collection Time: 07/07/21  4:15 PM   Specimen: BLOOD  Result Value Ref Range Status   Specimen Description BLOOD BLOOD RIGHT WRIST  Final   Special Requests   Final    BOTTLES DRAWN AEROBIC AND ANAEROBIC Blood Culture adequate volume   Culture   Final    NO GROWTH < 24 HOURS Performed at [facility], [address], [place],  [address]    Report Status PENDING  Incomplete         Radiology Studies: CT Angio Chest PE W and/or Wo Contrast  Result Date: 07/07/2021 CLINICAL DATA:  Pulmonary embolism (PE) suspected, high prob EXAM: CT ANGIOGRAPHY CHEST WITH CONTRAST TECHNIQUE: Multidetector CT imaging of the chest was performed using the  standard protocol during bolus administration of intravenous contrast. Multiplanar CT image reconstructions and MIPs were obtained to evaluate the vascular anatomy. CONTRAST:  77mL OMNIPAQUE IOHEXOL 350 MG/ML SOLN COMPARISON:  None. FINDINGS: Cardiovascular: Positive for pulmonary emboli. There is a large clot burden within the right and left branch pulmonary arteries, extending into the bilateral lobar, segmental, and subsegmental branches of each lobe. The right atrium and right ventricle are dilated with RV-LV ratio measuring 1.6, consistent with right heart strain. The thoracic aorta demonstrates mild atherosclerotic calcifications. No pericardial disease. Mediastinum/Nodes: No mediastinal, hilar, or axillary lymphadenopathy. Thyroid is unremarkable. Tiny hiatal hernia. Lungs/Pleura: No focal airspace consolidation. Bibasilar hypoventilatory changes. No suspicious pulmonary nodules or masses. No pleural effusion. No pneumothorax. Upper Abdomen: Tiny left hepatic cyst.  No acute findings. Musculoskeletal: No acute osseous abnormality. No suspicious lytic or blastic lesions. Review of the MIP images confirms the above findings. IMPRESSION:  Acute pulmonary emboli with extensive clot burden bilaterally as described above, and CT evidence of right heart strain. Positive for acute PE with CT evidence of right heart strain (RV/LV Ratio = 1.6) consistent with at least submassive (intermediate risk) PE. The presence of right heart strain has been associated with an increased risk of morbidity and mortality. Please refer to the "PE Focused" order set in EPIC. Critical Value/emergent results were called by telephone at the time of interpretation on 07/07/2021 at 5:40 pm to provider Merlyn Lot , who verbally acknowledged these results. Electronically Signed   By: Maurine Simmering M.D.   On: 07/07/2021 17:46   US Venous Img Lower Unilateral Left  Result Date: 07/07/2021 CLINICAL DATA:  Left lower extremity pain and  swelling, history of recent knee surgery, initial encounter EXAM: LEFT LOWER EXTREMITY VENOUS DOPPLER ULTRASOUND TECHNIQUE: Gray-scale sonography with graded compression, as well as color Doppler and duplex ultrasound were performed to evaluate the lower extremity deep venous systems from the level of the common femoral vein and including the common femoral, femoral, profunda femoral, popliteal and calf veins including the posterior tibial, peroneal and gastrocnemius veins when visible. The superficial great saphenous vein was also interrogated. Spectral Doppler was utilized to evaluate flow at rest and with distal augmentation maneuvers in the common femoral, femoral and popliteal veins. COMPARISON:  None. FINDINGS: Contralateral Common Femoral Vein: Respiratory phasicity is normal and symmetric with the symptomatic side. No evidence of thrombus. Normal compressibility. Common Femoral Vein: No evidence of thrombus. Normal compressibility, respiratory phasicity and response to augmentation. Saphenofemoral Junction: No evidence of thrombus. Normal compressibility and flow on color Doppler imaging. Profunda Femoral Vein: No evidence of thrombus. Normal compressibility and flow on color Doppler imaging. Femoral Vein: No evidence of thrombus. Normal compressibility, respiratory phasicity and response to augmentation. Popliteal Vein: Thrombus is noted with decreased compressibility. Calf Veins: Thrombus is noted within the posterior tibial vein. The peroneal vein is not well visualized. Superficial Great Saphenous Vein: No evidence of thrombus. Normal compressibility. Venous Reflux:  None. Other Findings:  None. IMPRESSION: Thrombus is noted within the popliteal and posterior tibial veins. Electronically Signed   By: Inez Catalina M.D.   On: 07/07/2021 18:27   DG Chest Port 1 View  Result Date: 07/07/2021 CLINICAL DATA:  Clinical concern for sepsis. Four weeks status post left knee meniscal surgery. EXAM: PORTABLE  CHEST 1 VIEW COMPARISON:  None. FINDINGS: Cardiac silhouette normal in size.  No mediastinal or hilar masses. Clear lungs.  No pleural effusion or pneumothorax. Skeletal structures are grossly intact. IMPRESSION: No active disease. Electronically Signed   By: Lajean Manes M.D.   On: 07/07/2021 16:41        Scheduled Meds:  Chlorhexidine Gluconate Cloth  6 each Topical Daily   Continuous Infusions:  heparin 1,250 Units/hr (07/08/21 1206)     LOS: 1 day    Time spent: 35 minutes    Sidney Ace, MD [facility]   If 7PM-7AM, please contact night-coverage  07/08/2021, 1:31 PM

## 2021-07-09 ENCOUNTER — Other Ambulatory Visit (INDEPENDENT_AMBULATORY_CARE_PROVIDER_SITE_OTHER): Payer: Self-pay | Admitting: Vascular Surgery

## 2021-07-09 ENCOUNTER — Encounter
Admission: EM | Disposition: A | Discharge status: Home / Self Care | Payer: Self-pay | Source: Home / Self Care | Attending: Internal Medicine

## 2021-07-09 DIAGNOSIS — I2782 Chronic pulmonary embolism: Secondary | ICD-10-CM

## 2021-07-09 HISTORY — PX: PULMONARY THROMBECTOMY: CATH118295

## 2021-07-09 LAB — CBC
HCT: 36.4 % (ref 36.0–46.0)
Hemoglobin: 12.4 g/dL (ref 12.0–15.0)
MCH: 31.6 pg (ref 26.0–34.0)
MCHC: 34.1 g/dL (ref 30.0–36.0)
MCV: 92.6 fL (ref 80.0–100.0)
Platelets: 148 10*3/uL — ABNORMAL LOW (ref 150–400)
RBC: 3.93 MIL/uL (ref 3.87–5.11)
RDW: 13.1 % (ref 11.5–15.5)
WBC: 7.4 10*3/uL (ref 4.0–10.5)
nRBC: 0 % (ref 0.0–0.2)

## 2021-07-09 LAB — GLUCOSE, CAPILLARY: Glucose-Capillary: 110 mg/dL — ABNORMAL HIGH (ref 70–99)

## 2021-07-09 LAB — HEPARIN LEVEL (UNFRACTIONATED): Heparin Unfractionated: 0.48 IU/mL (ref 0.30–0.70)

## 2021-07-09 SURGERY — PULMONARY THROMBECTOMY
Anesthesia: Moderate Sedation

## 2021-07-09 MED ORDER — FAMOTIDINE 20 MG PO TABS: 40.0000 mg | ORAL_TABLET | Freq: Once | ORAL | Status: AC | PRN

## 2021-07-09 MED ORDER — METHYLPREDNISOLONE SODIUM SUCC 125 MG IJ SOLR: 125.0000 mg | Freq: Once | INTRAMUSCULAR | Status: AC | PRN

## 2021-07-09 MED ORDER — FAMOTIDINE 20 MG PO TABS
ORAL_TABLET | ORAL | Status: AC
  Administered 2021-07-09: 13:00:00 40 mg via ORAL
  Filled 2021-07-09: qty 2

## 2021-07-09 MED ORDER — FENTANYL CITRATE PF 50 MCG/ML IJ SOSY
PREFILLED_SYRINGE | INTRAMUSCULAR | Status: AC
  Filled 2021-07-09: qty 1

## 2021-07-09 MED ORDER — METHYLPREDNISOLONE SODIUM SUCC 125 MG IJ SOLR
INTRAMUSCULAR | Status: AC
  Administered 2021-07-09: 13:00:00 125 mg via INTRAVENOUS
  Filled 2021-07-09: qty 2

## 2021-07-09 MED ORDER — FENTANYL CITRATE (PF) 100 MCG/2ML IJ SOLN
INTRAMUSCULAR | Status: DC | PRN
  Administered 2021-07-09: 50 ug via INTRAVENOUS

## 2021-07-09 MED ORDER — MIDAZOLAM HCL 2 MG/2ML IJ SOLN
INTRAMUSCULAR | Status: AC
  Filled 2021-07-09: qty 2

## 2021-07-09 MED ORDER — SODIUM CHLORIDE 0.9 % IV SOLN: INTRAVENOUS | Status: DC

## 2021-07-09 MED ORDER — MIDAZOLAM HCL 2 MG/ML PO SYRP: 8.0000 mg | ORAL_SOLUTION | Freq: Once | ORAL | Status: DC | PRN

## 2021-07-09 MED ORDER — CLINDAMYCIN PHOSPHATE 300 MG/50ML IV SOLN
INTRAVENOUS | Status: AC
  Administered 2021-07-09: 14:00:00 300 mg via INTRAVENOUS
  Filled 2021-07-09: qty 50

## 2021-07-09 MED ORDER — MIDAZOLAM HCL 2 MG/2ML IJ SOLN
INTRAMUSCULAR | Status: DC | PRN
  Administered 2021-07-09: 2 mg via INTRAVENOUS

## 2021-07-09 MED ORDER — DIPHENHYDRAMINE HCL 50 MG/ML IJ SOLN: 50.0000 mg | Freq: Once | INTRAMUSCULAR | Status: AC | PRN

## 2021-07-09 MED ORDER — HYDROMORPHONE HCL 1 MG/ML IJ SOLN: 1.0000 mg | Freq: Once | INTRAMUSCULAR | Status: DC | PRN

## 2021-07-09 MED ORDER — CLINDAMYCIN PHOSPHATE 300 MG/50ML IV SOLN: 300.0000 mg | Freq: Once | INTRAVENOUS | Status: AC | PRN

## 2021-07-09 MED ORDER — HEPARIN SODIUM (PORCINE) 1000 UNIT/ML IJ SOLN
INTRAMUSCULAR | Status: DC | PRN
  Administered 2021-07-09: 3000 [IU] via INTRAVENOUS

## 2021-07-09 MED ORDER — ONDANSETRON HCL 4 MG/2ML IJ SOLN: 4.0000 mg | Freq: Four times a day (QID) | INTRAMUSCULAR | Status: DC | PRN

## 2021-07-09 MED ORDER — DIPHENHYDRAMINE HCL 50 MG/ML IJ SOLN
INTRAMUSCULAR | Status: AC
  Administered 2021-07-09: 13:00:00 50 mg via INTRAVENOUS
  Filled 2021-07-09: qty 1

## 2021-07-09 MED ORDER — IODIXANOL 320 MG/ML IV SOLN
INTRAVENOUS | Status: DC | PRN
  Administered 2021-07-09: 14:00:00 45 mL

## 2021-07-09 SURGICAL SUPPLY — 13 items
CANISTER PENUMBRA ENGINE (MISCELLANEOUS) ×2 IMPLANT
CATH ANGIO 5F PIGTAIL 100CM (CATHETERS) ×2 IMPLANT
CATH INDIGO 12XTORQ 100 (CATHETERS) ×2 IMPLANT
CATH INDIGO SEP 12 (CATHETERS) ×2 IMPLANT
CATH INFINITI JR4 5F (CATHETERS) ×2 IMPLANT
DEVICE SAFEGUARD 24CM (GAUZE/BANDAGES/DRESSINGS) ×2 IMPLANT
GLIDEWIRE ADV .035X180CM (WIRE) ×2 IMPLANT
PACK ANGIOGRAPHY (CUSTOM PROCEDURE TRAY) ×3 IMPLANT
SHEATH PINNACLE 11FRX10 (SHEATH) ×2 IMPLANT
SYR MEDRAD MARK 7 150ML (SYRINGE) ×2 IMPLANT
TUBING CONTRAST HIGH PRESS 72 (TUBING) ×2 IMPLANT
WIRE GUIDERIGHT .035X150 (WIRE) ×2 IMPLANT
WIRE MAGIC TORQUE 260C (WIRE) ×2 IMPLANT

## 2021-07-09 NOTE — Plan of Care (Signed)

## 2021-07-09 NOTE — Progress Notes (Signed)
PULMONOLOGY         Date: 07/09/2021,   MRN# [id number] Sandra Phillips [birth year]     AdmissionWeight: 99.8 kg                 CurrentWeight: 99.8 kg   Referring physician: Dr Priscella Pritt   CHIEF COMPLAINT:   Bilateral pulmonary venous thromboembolism   HISTORY OF PRESENT ILLNESS   This is a very pleasant 70 yo F with hx of OA who came in acutely sob and was found to have tachycardia.  Chest imaging was done and found to have bilateral PE.  She was not unstable and vital signs remained normal with o2 requirement of 2L/min supplemental oxygen via nasal canula.  She had cardiac biomarkers done with mild elevation and CT evidence of component of Right heart strain.  I discussed case with ED doctor and [facility] hospitalisit and we opted to not use tPA due to clinical stability.  I reviewed imaging with patient in detail We discussed options and agree on having vascular surgery evaluation due to high clot burden evidenced by CT chest.   07/09/21- Met with wife and husband this am to review medical plan. Disucssed case with vascular surgery this am with plan for surgery today.   PAST MEDICAL HISTORY   Past Medical History:  Diagnosis Date   Primary osteoarthritis      SURGICAL HISTORY   Past Surgical History:  Procedure Laterality Date   KNEE ARTHROSCOPY Left 06/12/2021   Procedure: LEFT KNEE ARTHROSCOPY WITH DEBRIDEMENT AND REPAIR OF A POSTERIOR MEDIAL ROOT TEAR AND ABRASION CONDOPLASTY;  Surgeon: Corky Mull, MD;  Location: [facility] ORS;  Service: Orthopedics;  Laterality: Left;     FAMILY HISTORY   Family History  Problem Relation Age of Onset   Cancer Mother    Rheum arthritis Sister      SOCIAL HISTORY   Social History   Tobacco Use   Smoking status: Never   Smokeless tobacco: Never  Substance Use Topics   Alcohol use: Yes    Alcohol/week: 1.0 standard drink    Types: 1 Glasses of wine per week    Comment: a glass at dinner   Drug use: Never      MEDICATIONS    Home Medication:    Current Medication:  Current Facility-Administered Medications:    0.9 %  sodium chloride infusion, , Intravenous, Continuous, [name], [name], MD   [MAR Hold] acetaminophen (TYLENOL) tablet 650 mg, 650 mg, Oral, Q6H PRN **OR** [MAR Hold] acetaminophen (TYLENOL) suppository 650 mg, 650 mg, Rectal, Q6H PRN, [name], [name], MD   [MAR Hold] Chlorhexidine Gluconate Cloth 2 % PADS 6 each, 6 each, Topical, Daily, [name], [name], MD   fentaNYL (SUBLIMAZE) 50 MCG/ML injection, , , ,    heparin ADULT infusion 100 units/mL (25000 units/274m), 1,250 Units/hr, Intravenous, Continuous, [name], [name] MD, Last Rate: 12.5 mL/hr at 07/09/21 1515, 1,250 Units/hr at 07/09/21 1515   [MAR Hold] hydrALAZINE (APRESOLINE) injection 10 mg, 10 mg, Intravenous, Q6H PRN, [name], [name] MD   [MAR Hold] HYDROmorphone (DILAUDID) injection 1 mg, 1 mg, Intravenous, Once PRN, DLucky Cowboy [name] MD   iodixanol (VISIPAQUE) 320 MG/ML injection, , , PRN, DAlgernon Huxley MD, 45 mL at 07/09/21 1426   [MAR Hold] lip balm (BLISTEX) ointment, , Topical, PRN, [name], [name] MD   midazolam (VERSED) 2 MG/2ML injection, , , ,    [MAR Hold] ondansetron (ZOFRAN) tablet 4 mg, 4  mg, Oral, Q6H PRN **OR** [MAR Hold] ondansetron (ZOFRAN) injection 4 mg, 4 mg, Intravenous, Q6H PRN, [name], [name], MD   [MAR Hold] ondansetron (ZOFRAN) injection 4 mg, 4 mg, Intravenous, Q6H PRN, [name], [name], MD   [MAR Hold] traMADol (ULTRAM) tablet 50 mg, 50 mg, Oral, Q6H PRN, [name], [name], MD    ALLERGIES   Penicillins and Shrimp flavor     REVIEW OF SYSTEMS    Review of Systems:  Gen:  Denies  fever, sweats, chills weigh loss  HEENT: Denies blurred vision, double vision, ear pain, eye pain, hearing loss, nose bleeds, sore throat Cardiac:  No dizziness, chest pain or heaviness, chest tightness,edema Resp:   Denies cough or sputum porduction, shortness of breath,wheezing, hemoptysis,  Gi: Denies swallowing difficulty,  stomach pain, nausea or vomiting, diarrhea, constipation, bowel incontinence Gu:  Denies bladder incontinence, burning urine Ext:   Denies Joint pain, stiffness or swelling Skin: Denies  skin rash, easy bruising or bleeding or hives Endoc:  Denies polyuria, polydipsia , polyphagia or weight change Psych:   Denies depression, insomnia or hallucinations   Other:  All other systems negative   VS: BP 136/90 (BP Location: Right Arm)    Pulse 81    Temp 98 F (36.7 C) (Oral)    Resp 13    Ht 5' 8"  (1.727 m)    Wt 99.8 kg    SpO2 99%    BMI 33.45 kg/m      PHYSICAL EXAM    GENERAL:NAD, no fevers, chills, no weakness no fatigue HEAD: Normocephalic, atraumatic.  EYES: Pupils equal, round, reactive to light. Extraocular muscles intact. No scleral icterus.  MOUTH: Moist mucosal membrane. Dentition intact. No abscess noted.  EAR, NOSE, THROAT: Clear without exudates. No external lesions.  NECK: Supple. No thyromegaly. No nodules. No JVD.  PULMONARY:clear to auscultation bilaterally  CARDIOVASCULAR: S1 and S2. Regular rate and rhythm. No murmurs, rubs, or gallops. No edema. Pedal pulses 2+ bilaterally.  GASTROINTESTINAL: Soft, nontender, nondistended. No masses. Positive bowel sounds. No hepatosplenomegaly.  MUSCULOSKELETAL: No swelling, clubbing, or edema. Range of motion full in all extremities.  NEUROLOGIC: Cranial nerves II through XII are intact. No gross focal neurological deficits. Sensation intact. Reflexes intact.  SKIN: No ulceration, lesions, rashes, or cyanosis. Skin warm and dry. Turgor intact.  PSYCHIATRIC: Mood, affect within normal limits. The patient is awake, alert and oriented x 3. Insight, judgment intact.       IMAGING    CT Angio Chest PE W and/or Wo Contrast  Result Date: 07/07/2021 CLINICAL DATA:  Pulmonary embolism (PE) suspected, high prob EXAM: CT ANGIOGRAPHY CHEST WITH CONTRAST TECHNIQUE: Multidetector CT imaging of the chest was performed using the standard  protocol during bolus administration of intravenous contrast. Multiplanar CT image reconstructions and MIPs were obtained to evaluate the vascular anatomy. CONTRAST:  37m OMNIPAQUE IOHEXOL 350 MG/ML SOLN COMPARISON:  None. FINDINGS: Cardiovascular: Positive for pulmonary emboli. There is a large clot burden within the right and left branch pulmonary arteries, extending into the bilateral lobar, segmental, and subsegmental branches of each lobe. The right atrium and right ventricle are dilated with RV-LV ratio measuring 1.6, consistent with right heart strain. The thoracic aorta demonstrates mild atherosclerotic calcifications. No pericardial disease. Mediastinum/Nodes: No mediastinal, hilar, or axillary lymphadenopathy. Thyroid is unremarkable. Tiny hiatal hernia. Lungs/Pleura: No focal airspace consolidation. Bibasilar hypoventilatory changes. No suspicious pulmonary nodules or masses. No pleural effusion. No pneumothorax. Upper Abdomen: Tiny left hepatic cyst.  No acute findings. Musculoskeletal:  No acute osseous abnormality. No suspicious lytic or blastic lesions. Review of the MIP images confirms the above findings. IMPRESSION: Acute pulmonary emboli with extensive clot burden bilaterally as described above, and CT evidence of right heart strain. Positive for acute PE with CT evidence of right heart strain (RV/LV Ratio = 1.6) consistent with at least submassive (intermediate risk) PE. The presence of right heart strain has been associated with an increased risk of morbidity and mortality. Please refer to the "PE Focused" order set in EPIC. Critical Value/emergent results were called by telephone at the time of interpretation on 07/07/2021 at 5:40 pm to provider Merlyn Lot , who verbally acknowledged these results. Electronically Signed   By: Maurine Simmering M.D.   On: 07/07/2021 17:46   PERIPHERAL VASCULAR CATHETERIZATION  Result Date: 07/09/2021 See surgical note for result.  US Venous Img Lower  Unilateral Left  Result Date: 07/07/2021 CLINICAL DATA:  Left lower extremity pain and swelling, history of recent knee surgery, initial encounter EXAM: LEFT LOWER EXTREMITY VENOUS DOPPLER ULTRASOUND TECHNIQUE: Gray-scale sonography with graded compression, as well as color Doppler and duplex ultrasound were performed to evaluate the lower extremity deep venous systems from the level of the common femoral vein and including the common femoral, femoral, profunda femoral, popliteal and calf veins including the posterior tibial, peroneal and gastrocnemius veins when visible. The superficial great saphenous vein was also interrogated. Spectral Doppler was utilized to evaluate flow at rest and with distal augmentation maneuvers in the common femoral, femoral and popliteal veins. COMPARISON:  None. FINDINGS: Contralateral Common Femoral Vein: Respiratory phasicity is normal and symmetric with the symptomatic side. No evidence of thrombus. Normal compressibility. Common Femoral Vein: No evidence of thrombus. Normal compressibility, respiratory phasicity and response to augmentation. Saphenofemoral Junction: No evidence of thrombus. Normal compressibility and flow on color Doppler imaging. Profunda Femoral Vein: No evidence of thrombus. Normal compressibility and flow on color Doppler imaging. Femoral Vein: No evidence of thrombus. Normal compressibility, respiratory phasicity and response to augmentation. Popliteal Vein: Thrombus is noted with decreased compressibility. Calf Veins: Thrombus is noted within the posterior tibial vein. The peroneal vein is not well visualized. Superficial Great Saphenous Vein: No evidence of thrombus. Normal compressibility. Venous Reflux:  None. Other Findings:  None. IMPRESSION: Thrombus is noted within the popliteal and posterior tibial veins. Electronically Signed   By: Inez Catalina M.D.   On: 07/07/2021 18:27   DG Chest Port 1 View  Result Date: 07/07/2021 CLINICAL DATA:   Clinical concern for sepsis. Four weeks status post left knee meniscal surgery. EXAM: PORTABLE CHEST 1 VIEW COMPARISON:  None. FINDINGS: Cardiac silhouette normal in size.  No mediastinal or hilar masses. Clear lungs.  No pleural effusion or pneumothorax. Skeletal structures are grossly intact. IMPRESSION: No active disease. Electronically Signed   By: Lajean Manes M.D.   On: 07/07/2021 16:41   ECHOCARDIOGRAM COMPLETE  Result Date: 07/08/2021    ECHOCARDIOGRAM REPORT   Patient Name:   Sandra Phillips Date of Exam: 07/08/2021 Medical Rec #:  [id number]    Height:       68.0 in Accession #:    [id number]   Weight:       220.0 lb Date of Birth:  16-Apr-1951    BSA:          2.128 m Patient Age:    79 years     BP:           148/87 mmHg Patient Gender: F  HR:           90 bpm. Exam Location:  [facility] Procedure: 2D Echo and Intracardiac Opacification Agent Indications:     Pulmonary Embolus I26.09  History:         Patient has no prior history of Echocardiogram examinations.  Sonographer:     Kathlen Brunswick RDCS Referring Phys:  [id number] AMY N COX Diagnosing Phys: Serafina Royals MD  Sonographer Comments: Technically difficult study due to poor echo windows. IMPRESSIONS  1. Left ventricular ejection fraction, by estimation, is 60 to 65%. The left ventricle has normal function. The left ventricle has no regional wall motion abnormalities. Left ventricular diastolic parameters were normal.  2. Right ventricular systolic function is mildly reduced. The right ventricular size is severely enlarged. Mildly increased right ventricular wall thickness. There is severely elevated pulmonary artery systolic pressure.  3. Right atrial size was moderately dilated.  4. The mitral valve is normal in structure. Trivial mitral valve regurgitation.  5. Tricuspid valve regurgitation is moderate to severe.  6. The aortic valve is normal in structure. Aortic valve regurgitation is not visualized. FINDINGS  Left Ventricle: Left  ventricular ejection fraction, by estimation, is 60 to 65%. The left ventricle has normal function. The left ventricle has no regional wall motion abnormalities. Definity contrast agent was given IV to delineate the left ventricular  endocardial borders. The left ventricular internal cavity size was small. There is no left ventricular hypertrophy. Left ventricular diastolic parameters were normal. Right Ventricle: The right ventricular size is severely enlarged. Mildly increased right ventricular wall thickness. Right ventricular systolic function is mildly reduced. There is severely elevated pulmonary artery systolic pressure. Left Atrium: Left atrial size was normal in size. Right Atrium: Right atrial size was moderately dilated. Pericardium: There is no evidence of pericardial effusion. Mitral Valve: The mitral valve is normal in structure. Trivial mitral valve regurgitation. Tricuspid Valve: The tricuspid valve is normal in structure. Tricuspid valve regurgitation is moderate to severe. Aortic Valve: The aortic valve is normal in structure. Aortic valve regurgitation is not visualized. Aortic valve peak gradient measures 4.5 mmHg. Pulmonic Valve: The pulmonic valve was normal in structure. Pulmonic valve regurgitation is mild. Aorta: The aortic root and ascending aorta are structurally normal, with no evidence of dilitation. IAS/Shunts: No atrial level shunt detected by color flow Doppler.  LEFT VENTRICLE PLAX 2D LVIDd:         4.00 cm Diastology LVIDs:         2.80 cm LV e' medial:    6.20 cm/s LV PW:         1.20 cm LV E/e' medial:  8.0 LV IVS:        1.20 cm LV e' lateral:   5.98 cm/s                        LV E/e' lateral: 8.3  RIGHT VENTRICLE RV Basal diam:  5.00 cm RV S prime:     7.94 cm/s TAPSE (M-mode): 1.1 cm LEFT ATRIUM           Index        RIGHT ATRIUM           Index LA diam:      2.80 cm 1.32 cm/m   RA Area:     24.50 cm LA Vol (A4C): 45.1 ml 21.19 ml/m  RA Volume:   91.20 ml  42.85 ml/m   AORTIC VALVE AV Vmax:  106.00 cm/s AV Peak Grad: 4.5 mmHg LVOT Vmax:    75.10 cm/s LVOT Vmean:   49.100 cm/s LVOT VTI:     0.129 m MITRAL VALVE               TRICUSPID VALVE MV Area (PHT): 8.34 cm    TV Peak grad:   36.2 mmHg MV Decel Time: 91 msec     TV Vmax:        3.01 m/s MV E velocity: 49.70 cm/s MV A velocity: 39.00 cm/s  SHUNTS MV E/A ratio:  1.27        Systemic VTI: 0.13 m Serafina Royals MD Electronically signed by Serafina Royals MD Signature Date/Time: 07/08/2021/3:20:41 PM    Final       ASSESSMENT/PLAN   Bilateral PE and DVT   - patient is stable but with extensive thrombosis bilaterally    - vital signs remain adequate and with mild hypoxemia   - Simlified PESI - 8.9% death risk    -recommendation for vascular surgery evaluation for possible IVC filter and thrombectomy   -reviewed imaging with patient in detail , patient wishes to have surgical intervention to help remove blood clots    Thank you for allowing me to participate in the care of this patient.   Patient/Family are satisfied with care plan and all questions have been answered.  This document was prepared using Dragon voice recognition software and may include unintentional dictation errors.     Ottie Glazier, M.D.  Division of [place]

## 2021-07-09 NOTE — Op Note (Signed)
[name]  Percutaneous Study/Intervention Procedural Note   Date of Surgery: 07/09/2021,2:31 PM  Surgeon: Leotis Pain  Pre-operative Diagnosis: Symptomatic bilateral pulmonary emboli  Post-operative diagnosis:  Same  Procedure(s) Performed:  1.  Contrast injection right heart  2.  Mechanical thrombectomy left lower lobe and left upper lobe pulmonary arteries in the right lower lobe, right middle lobe, and right upper lobe pulmonary arteries using the penumbra CAT 12 thrombectomy device  4.  Selective catheter placement right lower lobe, middle lobe, and upper lobe pulmonary artery  5.  Selective catheter placement left lower lobe and upper lobe pulmonary arteries    Anesthesia: Conscious sedation was administered under my direct supervision by the interventional radiology RN. IV Versed plus fentanyl were utilized. Continuous ECG, pulse oximetry and blood pressure was monitored throughout the entire procedure.  Versed and fentanyl were administered intravenously.  Conscious sedation was administered for a total of 40 minutes using 2 of Versed and 50 mcg of Fentanyl.  EBL: 400 cc  Sheath: 11 French right femoral vein  Contrast: 45 cc   Fluoroscopy Time: 10.4 minutes  Indications:  Patient presents with pulmonary emboli. The patient is symptomatic with hypoxemia and dyspnea on exertion.  There is evidence of right heart strain on the CT angiogram. The patient is otherwise a good candidate for intervention and even the long-term benefits pulmonary angiography with thrombolysis is offered. The risks and benefits are reviewed long-term benefits are discussed. All questions are answered patient agrees to proceed.  Procedure:  [name] a 70 y.o. female who was identified and appropriate procedural time out was performed.  The patient was then placed supine on the table and prepped and draped in the usual sterile fashion.  Ultrasound was used to evaluate the  right common femoral vein.  It was patent, as it was echolucent and compressible.  A digital ultrasound image was acquired for the permanent record.  A Seldinger needle was used to access the right common femoral vein under direct ultrasound guidance.  A 0.035 J wire was advanced without resistance and a 5Fr sheath was placed and then upsized to an 11 [place] sheath.    The wire and pigtail catheter were then negotiated into the right atrium and bolus injection of contrast was utilized to demonstrate the right ventricle and the pulmonary artery outflow. The wire and catheter were then negotiated into the left main pulmonary artery and then into the left lower and left upper lobe pulmonary arteries where hand injection of contrast was utilized to demonstrate the pulmonary arteries and confirm the locations of the pulmonary emboli.  Significant clot burden was seen on the left side.  I then flipped the catheter over to the right side and first cannulated the right lower lobe and then the right middle and upper lobe pulmonary arteries.  Extensive clot burden was seen throughout the distal main right pulmonary artery and all 3 lobar pulmonary arteries  3000 units of heparin was then given and allowed to circulate.  The Penumbra Cat 12 catheter was then advanced up into the pulmonary vasculature. The left lung was addressed first. Catheter was negotiated into the left lower lobe and mechanical thrombectomy was performed.  Passes were made with the catheter and the separator.  Follow-up imaging demonstrated a good result and therefore the catheter was renegotiated into the left upper lobe pulmonary artery and again mechanical thrombectomy was performed. Passes were made with both the Penumbra catheter itself as well as introducing  the separator. Follow-up imaging was then performed.  Only a small amount of residual thrombus burden was seen on the left side after thrombectomy's we turned our attention to the  right.  The Penumbra Cat 12 catheter was then negotiated to the opposite side. The right lung was then addressed. Catheter was negotiated into the right lower lobe pulmonary artery and mechanical thrombectomy was performed.  Again, passes were made with the separator.  Follow-up imaging demonstrated a good result and therefore the catheter was renegotiated into the right middle lobe pulmonary artery and again mechanical thrombectomy was performed. Passes were made with both the Penumbra catheter itself as well as introducing the separator.  Finally, we were able to tediously advance up into the right upper lobe pulmonary artery and the penumbra CAT 12 catheter advanced into the right upper lobe pulmonary artery where mechanical thrombectomy was again performed with the use of separator.  Follow-up imaging was then performed.  Only a small amount of residual thrombus burden was seen in the right lower lobe without significant residual right middle lobe thrombus.  There is a moderate amount of thrombus in the right upper lobe pulmonary artery but it was improved.  After review these images wires were reintroduced and the catheters removed. Then, the sheath is then pulled and pressures held. A safeguard is placed.    Findings:   Right heart imaging:  Right atrium and right ventricle and the pulmonary outflow tract appears mildly dilated  Right lung: Extensive clot burden throughout the right lower lobe, middle lobe, upper lobe, and distal main right pulmonary artery  Left lung: Significant thrombus burden in the left lower lobe and left upper lobe pulmonary arteries    Disposition: Patient was taken to the recovery room in stable condition having tolerated the procedure well.  Sandra Phillips 07/09/2021,2:31 PM

## 2021-07-09 NOTE — Interval H&P Note (Signed)
History and Physical Interval Note:  07/09/2021 1:32 PM  Sandra Phillips  has presented today for surgery, with the diagnosis of PE.  The various methods of treatment have been discussed with the patient and family. After consideration of risks, benefits and other options for treatment, the patient has consented to  Procedure(s): PULMONARY THROMBECTOMY (N/A) as a surgical intervention.  The patient's history has been reviewed, patient examined, no change in status, stable for surgery.  I have reviewed the patient's chart and labs.  Questions were answered to the patient's satisfaction.     Leotis Pain

## 2021-07-09 NOTE — Consult Note (Signed)
[facility] Consult Note  MRN : [id number]  Sandra Phillips is a 70 y.o. (Aug 29, 1950) female who presents with chief complaint of  Chief Complaint  Patient presents with   Code Sepsis   History of Present Illness:  Sandra Phillips is a 70 y.o. female with medical history significant for primary osteoarthritis of the left knee, complex tear of the medial meniscus of the left knee status post left knee arthroscopy with debridement and repair of the posterior medial root tear and abrasion  chondoplasty who presents to the emergency department from home for chief concerns of shortness of breath.   She noticed swelling of her left foot on Tuesday and/or Wednesday (07/04/21).  She endorses compliance with aspirin 325 mg daily.  She states she she has not missed a single dose.  She reports that since the operation, she is mainly sitting in a chair and/or in bed laying down as movement is difficult for her.   She states that she noticed shortness of breath that started approximately 5:30 PM on 12/16 after an intense first physical therapy session. She she states that the shortness of breath improved with resting at home and she slept well on evening of 07/06/2021. Today, she noticed worsening dyspnea with exertion, worsening shortness of breath with rest, diaphoretic, and nausea and vomiting.  She only had Pedialyte today prior to presentation to the ED.   These acute symptoms prompted her to present to the emergency department for further evaluation.   She denies chest pain. She endorses constipation.  She denies diarrhea.    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  Vascular surgery was consulted by Dr. Priscella Bontempo in the setting of pulmonary embolism and possible vascular intervention.  Current Facility-Administered Medications  Medication Dose Route Frequency Provider Last Rate Last Admin   0.9 %  sodium  chloride infusion   Intravenous Continuous Claramae Rigdon A, PA-C       acetaminophen (TYLENOL) tablet 650 mg  650 mg Oral Q6H PRN Cox, Amy N, DO       Or   acetaminophen (TYLENOL) suppository 650 mg  650 mg Rectal Q6H PRN Cox, Amy N, DO       Chlorhexidine Gluconate Cloth 2 % PADS 6 each  6 each Topical Daily Sreenath, Sudheer B, MD       heparin ADULT infusion 100 units/mL (25000 units/228mL)  1,250 Units/hr Intravenous Continuous Renda Rolls, RPH 12.5 mL/hr at 07/09/21 0359 1,250 Units/hr at 07/09/21 0359   hydrALAZINE (APRESOLINE) injection 10 mg  10 mg Intravenous Q6H PRN Cox, Amy N, DO       lip balm (BLISTEX) ointment   Topical PRN Sreenath, Sudheer B, MD       ondansetron (ZOFRAN) tablet 4 mg  4 mg Oral Q6H PRN Cox, Amy N, DO       Or   ondansetron (ZOFRAN) injection 4 mg  4 mg Intravenous Q6H PRN Cox, Amy N, DO       traMADol (ULTRAM) tablet 50 mg  50 mg Oral Q6H PRN Cox, Amy N, DO       Past Medical History:  Diagnosis Date   Primary osteoarthritis    Past Surgical History:  Procedure Laterality Date   KNEE ARTHROSCOPY Left 06/12/2021   Procedure: LEFT KNEE ARTHROSCOPY WITH DEBRIDEMENT AND REPAIR OF A POSTERIOR MEDIAL ROOT TEAR AND ABRASION CONDOPLASTY;  Surgeon: Corky Mull, MD;  Location:  [facility] ORS;  Service: Orthopedics;  Laterality: Left;   Social History Social History   Tobacco Use   Smoking status: Never   Smokeless tobacco: Never  Substance Use Topics   Alcohol use: Yes    Alcohol/week: 1.0 standard drink    Types: 1 Glasses of wine per week    Comment: a glass at dinner   Drug use: Never  She lives with her partner, Sandra Phillips.  She denies any tobacco, recreational drug use.  She endorses infrequent EtOH, social drinking or with dinner.  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   Family History Family History  Problem Relation Age of Onset   Cancer Mother    Rheum arthritis Sister   Denies family history of  bleeding/clotting disorders, peripheral artery disease or venous disease.  Allergies  Allergen Reactions   Penicillins     Unknown childhood reaction    Shrimp Flavor Swelling   REVIEW OF SYSTEMS (Negative unless checked)  Constitutional: [] Weight loss  [] Fever  [] Chills Cardiac: [] Chest pain   [] Chest pressure   [] Palpitations   [x] Shortness of breath when laying flat   [x] Shortness of breath at rest   [x] Shortness of breath with exertion. Vascular:  [] Pain in legs with walking   [] Pain in legs at rest   [] Pain in legs when laying flat   [] Claudication   [] Pain in feet when walking  [] Pain in feet at rest  [] Pain in feet when laying flat   [] History of DVT   [] Phlebitis   [] Swelling in legs   [] Varicose veins   [] Non-healing ulcers Pulmonary:   [] Uses home oxygen   [] Productive cough   [] Hemoptysis   [] Wheeze  [] COPD   [] Asthma Neurologic:  [] Dizziness  [] Blackouts   [] Seizures   [] History of stroke   [] History of TIA  [] Aphasia   [] Temporary blindness   [] Dysphagia   [] Weakness or numbness in arms   [] Weakness or numbness in legs Musculoskeletal:  [] Arthritis   [] Joint swelling   [] Joint pain   [] Low back pain Hematologic:  [] Easy bruising  [] Easy bleeding   [] Hypercoagulable state   [] Anemic  [] Hepatitis Gastrointestinal:  [] Blood in stool   [] Vomiting blood  [] Gastroesophageal reflux/heartburn   [] Difficulty swallowing. Genitourinary:  [] Chronic kidney disease   [] Difficult urination  [] Frequent urination  [] Burning with urination   [] Blood in urine Skin:  [] Rashes   [] Ulcers   [] Wounds Psychological:  [] History of anxiety   []  History of major depression.  Physical Examination  Vitals:   07/09/21 0700 07/09/21 0800 07/09/21 0900 07/09/21 1000  BP: 104/72 134/89 (!) 156/83 139/88  Pulse: 65 78 75 71  Resp: 13 20 12  (!) 23  Temp:   98.4 F (36.9 C)   TempSrc:   Oral   SpO2: 97% 97% 96% 96%  Weight:      Height:       Body mass index is 34.69 kg/m. Gen:  WD/WN, NAD Head:  [place]/AT, No temporalis wasting. Prominent temp pulse not noted. Ear/Nose/Throat: Hearing grossly intact, nares w/o erythema or drainage, oropharynx w/o Erythema/Exudate Eyes: Sclera non-icteric, conjunctiva clear Neck: Trachea midline.  No JVD.  Pulmonary: Becomes short of breath with talking.  Bilateral lung sounds diminished. Cardiac: RRR, normal S1, S2. Vascular:  Vessel Right Left  Radial Palpable Palpable  Ulnar Palpable Palpable  Brachial Palpable Palpable  Carotid Palpable, without bruit Palpable, without bruit  Aorta Not palpable N/A  Femoral Palpable Palpable  Popliteal Palpable Palpable  PT Palpable Palpable  DP Palpable Palpable   Left lower extremity: Thigh soft.  Calf soft.  Extremities warm distally toes.  Minimal edema.  Nontender to palpation.  There is no acute vascular compromise noted to the extremity at this time.  Gastrointestinal: soft, non-tender/non-distended. No guarding/reflex.  Musculoskeletal: M/S 5/5 throughout.  Extremities without ischemic changes.  No deformity or atrophy. No edema. Neurologic: Sensation grossly intact in extremities.  Symmetrical.  Speech is fluent. Motor exam as listed above. Psychiatric: Judgment intact, Mood & affect appropriate for pt's clinical situation. Dermatologic: No rashes or ulcers noted.  No cellulitis or open wounds. Lymph : No Cervical, Axillary, or Inguinal lymphadenopathy.  CBC Lab Results  Component Value Date   WBC 7.4 07/09/2021   HGB 12.4 07/09/2021   HCT 36.4 07/09/2021   MCV 92.6 07/09/2021   PLT 148 (L) 07/09/2021   BMET    Component Value Date/Time   NA 140 07/08/2021 0429   NA 141 09/23/2012 0735   K 4.2 07/08/2021 0429   K 3.9 09/23/2012 0735   CL 111 07/08/2021 0429   CL 106 09/23/2012 0735   CO2 22 07/08/2021 0429   CO2 25 09/23/2012 0735   GLUCOSE 110 (H) 07/08/2021 0429   GLUCOSE 142 (H) 09/23/2012 0735   BUN 12 07/08/2021 0429   BUN 11 09/23/2012 0735   CREATININE 0.74 07/08/2021 0429    CREATININE 0.98 09/23/2012 0735   CALCIUM 8.6 (L) 07/08/2021 0429   CALCIUM 9.0 09/23/2012 0735   GFRNONAA >60 07/08/2021 0429   GFRNONAA >60 09/23/2012 0735   GFRAA >60 09/23/2012 0735   Estimated Creatinine Clearance: 84.7 mL/min (by C-G formula based on SCr of 0.74 mg/dL).  COAG Lab Results  Component Value Date   INR 1.1 07/07/2021   Radiology CT Angio Chest PE W and/or Wo Contrast  Result Date: 07/07/2021 CLINICAL DATA:  Pulmonary embolism (PE) suspected, high prob EXAM: CT ANGIOGRAPHY CHEST WITH CONTRAST TECHNIQUE: Multidetector CT imaging of the chest was performed using the standard protocol during bolus administration of intravenous contrast. Multiplanar CT image reconstructions and MIPs were obtained to evaluate the vascular anatomy. CONTRAST:  66mL OMNIPAQUE IOHEXOL 350 MG/ML SOLN COMPARISON:  None. FINDINGS: Cardiovascular: Positive for pulmonary emboli. There is a large clot burden within the right and left branch pulmonary arteries, extending into the bilateral lobar, segmental, and subsegmental branches of each lobe. The right atrium and right ventricle are dilated with RV-LV ratio measuring 1.6, consistent with right heart strain. The thoracic aorta demonstrates mild atherosclerotic calcifications. No pericardial disease. Mediastinum/Nodes: No mediastinal, hilar, or axillary lymphadenopathy. Thyroid is unremarkable. Tiny hiatal hernia. Lungs/Pleura: No focal airspace consolidation. Bibasilar hypoventilatory changes. No suspicious pulmonary nodules or masses. No pleural effusion. No pneumothorax. Upper Abdomen: Tiny left hepatic cyst.  No acute findings. Musculoskeletal: No acute osseous abnormality. No suspicious lytic or blastic lesions. Review of the MIP images confirms the above findings. IMPRESSION: Acute pulmonary emboli with extensive clot burden bilaterally as described above, and CT evidence of right heart strain. Positive for acute PE with CT evidence of right heart strain  (RV/LV Ratio = 1.6) consistent with at least submassive (intermediate risk) PE. The presence of right heart strain has been associated with an increased risk of morbidity and mortality. Please refer to the "PE Focused" order set in EPIC. Critical Value/emergent results were called by telephone at the time of interpretation on 07/07/2021 at 5:40 pm to provider Merlyn Lot , who verbally acknowledged these results. Electronically Signed  By: Maurine Simmering M.D.   On: 07/07/2021 17:46   US Venous Img Lower Unilateral Left  Result Date: 07/07/2021 CLINICAL DATA:  Left lower extremity pain and swelling, history of recent knee surgery, initial encounter EXAM: LEFT LOWER EXTREMITY VENOUS DOPPLER ULTRASOUND TECHNIQUE: Gray-scale sonography with graded compression, as well as color Doppler and duplex ultrasound were performed to evaluate the lower extremity deep venous systems from the level of the common femoral vein and including the common femoral, femoral, profunda femoral, popliteal and calf veins including the posterior tibial, peroneal and gastrocnemius veins when visible. The superficial great saphenous vein was also interrogated. Spectral Doppler was utilized to evaluate flow at rest and with distal augmentation maneuvers in the common femoral, femoral and popliteal veins. COMPARISON:  None. FINDINGS: Contralateral Common Femoral Vein: Respiratory phasicity is normal and symmetric with the symptomatic side. No evidence of thrombus. Normal compressibility. Common Femoral Vein: No evidence of thrombus. Normal compressibility, respiratory phasicity and response to augmentation. Saphenofemoral Junction: No evidence of thrombus. Normal compressibility and flow on color Doppler imaging. Profunda Femoral Vein: No evidence of thrombus. Normal compressibility and flow on color Doppler imaging. Femoral Vein: No evidence of thrombus. Normal compressibility, respiratory phasicity and response to augmentation. Popliteal  Vein: Thrombus is noted with decreased compressibility. Calf Veins: Thrombus is noted within the posterior tibial vein. The peroneal vein is not well visualized. Superficial Great Saphenous Vein: No evidence of thrombus. Normal compressibility. Venous Reflux:  None. Other Findings:  None. IMPRESSION: Thrombus is noted within the popliteal and posterior tibial veins. Electronically Signed   By: Inez Catalina M.D.   On: 07/07/2021 18:27   DG Chest Port 1 View  Result Date: 07/07/2021 CLINICAL DATA:  Clinical concern for sepsis. Four weeks status post left knee meniscal surgery. EXAM: PORTABLE CHEST 1 VIEW COMPARISON:  None. FINDINGS: Cardiac silhouette normal in size.  No mediastinal or hilar masses. Clear lungs.  No pleural effusion or pneumothorax. Skeletal structures are grossly intact. IMPRESSION: No active disease. Electronically Signed   By: Lajean Manes M.D.   On: 07/07/2021 16:41   ECHOCARDIOGRAM COMPLETE  Result Date: 07/08/2021    ECHOCARDIOGRAM REPORT   Patient Name:   SUMAYAH BEARSE Date of Exam: 07/08/2021 Medical Rec #:  [id number]    Height:       68.0 in Accession #:    [id number]   Weight:       220.0 lb Date of Birth:  02-14-1951    BSA:          2.128 m Patient Age:    43 years     BP:           148/87 mmHg Patient Gender: F            HR:           90 bpm. Exam Location:  [facility] Procedure: 2D Echo and Intracardiac Opacification Agent Indications:     Pulmonary Embolus I26.09  History:         Patient has no prior history of Echocardiogram examinations.  Sonographer:     Kathlen Brunswick RDCS Referring Phys:  [id number] AMY N COX Diagnosing Phys: Serafina Royals MD  Sonographer Comments: Technically difficult study due to poor echo windows. IMPRESSIONS  1. Left ventricular ejection fraction, by estimation, is 60 to 65%. The left ventricle has normal function. The left ventricle has no regional wall motion abnormalities. Left ventricular diastolic parameters were normal.  2. Right ventricular  systolic function is mildly  reduced. The right ventricular size is severely enlarged. Mildly increased right ventricular wall thickness. There is severely elevated pulmonary artery systolic pressure.  3. Right atrial size was moderately dilated.  4. The mitral valve is normal in structure. Trivial mitral valve regurgitation.  5. Tricuspid valve regurgitation is moderate to severe.  6. The aortic valve is normal in structure. Aortic valve regurgitation is not visualized. FINDINGS  Left Ventricle: Left ventricular ejection fraction, by estimation, is 60 to 65%. The left ventricle has normal function. The left ventricle has no regional wall motion abnormalities. Definity contrast agent was given IV to delineate the left ventricular  endocardial borders. The left ventricular internal cavity size was small. There is no left ventricular hypertrophy. Left ventricular diastolic parameters were normal. Right Ventricle: The right ventricular size is severely enlarged. Mildly increased right ventricular wall thickness. Right ventricular systolic function is mildly reduced. There is severely elevated pulmonary artery systolic pressure. Left Atrium: Left atrial size was normal in size. Right Atrium: Right atrial size was moderately dilated. Pericardium: There is no evidence of pericardial effusion. Mitral Valve: The mitral valve is normal in structure. Trivial mitral valve regurgitation. Tricuspid Valve: The tricuspid valve is normal in structure. Tricuspid valve regurgitation is moderate to severe. Aortic Valve: The aortic valve is normal in structure. Aortic valve regurgitation is not visualized. Aortic valve peak gradient measures 4.5 mmHg. Pulmonic Valve: The pulmonic valve was normal in structure. Pulmonic valve regurgitation is mild. Aorta: The aortic root and ascending aorta are structurally normal, with no evidence of dilitation. IAS/Shunts: No atrial level shunt detected by color flow Doppler.  LEFT VENTRICLE PLAX 2D  LVIDd:         4.00 cm Diastology LVIDs:         2.80 cm LV e' medial:    6.20 cm/s LV PW:         1.20 cm LV E/e' medial:  8.0 LV IVS:        1.20 cm LV e' lateral:   5.98 cm/s                        LV E/e' lateral: 8.3  RIGHT VENTRICLE RV Basal diam:  5.00 cm RV S prime:     7.94 cm/s TAPSE (M-mode): 1.1 cm LEFT ATRIUM           Index        RIGHT ATRIUM           Index LA diam:      2.80 cm 1.32 cm/m   RA Area:     24.50 cm LA Vol (A4C): 45.1 ml 21.19 ml/m  RA Volume:   91.20 ml  42.85 ml/m  AORTIC VALVE AV Vmax:      106.00 cm/s AV Peak Grad: 4.5 mmHg LVOT Vmax:    75.10 cm/s LVOT Vmean:   49.100 cm/s LVOT VTI:     0.129 m MITRAL VALVE               TRICUSPID VALVE MV Area (PHT): 8.34 cm    TV Peak grad:   36.2 mmHg MV Decel Time: 91 msec     TV Vmax:        3.01 m/s MV E velocity: 49.70 cm/s MV A velocity: 39.00 cm/s  SHUNTS MV E/A ratio:  1.27        Systemic VTI: 0.13 m Serafina Royals MD Electronically signed by Serafina Royals MD Signature Date/Time: 07/08/2021/3:20:41 PM  Final     Assessment/Plan AMANA BOUSKA is a 70 y.o. female with medical history significant for primary osteoarthritis of the left knee, complex tear of the medial meniscus of the left knee status post left knee arthroscopy with debridement and repair of the posterior medial root tear and abrasion  chondoplasty who presents to the emergency department from home for chief concerns of shortness of breath found to have pulmonary embolism and left lower extremity DVT.  1.  Pulmonary embolism: Patient presented to the [facility] emergency department with progressively worsening shortness of breath.  Her symptoms worsen with ambulation and speaking.  Right heart strain noted on CTA chest.  Recommend undergoing a pulmonary thrombectomy possible thrombolysis however the patient did have recent orthopedic surgery this month in an attempt to lessen her clot burden and improve her symptoms as well as her right  heart strain.  Procedure, risk and benefits were explained to the patient.  All questions were answered.  The patient wished to proceed.  We will plan on this today with Dr. Lucky Cowboy.  2.  Left lower extremity DVT: Venous duplex was notable for thrombus within the popliteal and posterior tibial veins. At this time, the patient is asymptomatic and denies any swelling or pain. I explained that we do have a 2-week.  If her symptoms should worsen to revisit a peripheral thrombectomy. We will continue to follow her DVT in the outpatient setting.  3.  Need for long-term anticoagulation: Patient is currently on a heparin drip. She understands that with a diagnosis of pulmonary embolism she will be on oral anticoagulation for approximately 1 year. She expresses her understanding.  Discussed with Dr. Mayme Genta, PA-C 07/09/2021 11:12 AM  This note was created with Dragon medical transcription system.  Any error is purely unintentional.

## 2021-07-09 NOTE — Progress Notes (Signed)
PROGRESS NOTE    Sandra Phillips  PFY:[id number] DOB: 1951-04-17 DOA: 07/07/2021 PCP: [place], [place] (Inactive)    Brief Narrative:  70 y.o. female with medical history significant for primary osteoarthritis of the left knee, complex tear of the medial meniscus of the left knee status post left knee arthroscopy with debridement and repair of the posterior medial root tear and abrasion  chondoplasty who presents to the emergency department from home for chief concerns of shortness of breath.   She noticed swelling of her left foot on Tuesday and/or Wednesday (07/04/21).  She endorses compliance with aspirin 325 mg daily.  She states she she has not missed a single dose.  She reports that since the operation, she is mainly sitting in a chair and/or in bed laying down as movement is difficult for her.   She states that she noticed shortness of breath that started approximately 5:30 PM on 12/16 after an intense first physical therapy session. She she states that the shortness of breath improved with resting at home and she slept well on evening of 07/06/2021. Today, she noticed worsening dyspnea with exertion, worsening shortness of breath with rest, diaphoretic, and nausea and vomiting.  She only had Pedialyte today prior to presentation to the ED.   These acute symptoms prompted her to present to the emergency department for further evaluation.  CT angio chest significant for bilateral multifocal pulmonary emboli with CT evidence of right heart strain.  Left lower extremity significant for DVT.  Discussed with vascular surgery.  Plan for pulmonary thrombectomy 12/19   Assessment & Plan:   Principal Problem:   Pulmonary embolism [facility]) Active Problems:   Sinus tachycardia   Primary osteoarthritis   Popliteal DVT (deep venous thrombosis) (HCC)   Acute deep vein thrombosis (DVT) of popliteal vein of left lower extremity (HCC)  Bilateral pulmonary embolism,  submassive with right heart strain Acute lower extremity DVT and popliteal and posterior tibial veins Presumably secondary to recent orthopedic intervention Orthopedic repair on 11/22 Patient was on aspirin 325 daily for 6 weeks as DVT prophylaxis Hemodynamically stable, on 2 L Plan: TTE, pending pulmonary thrombectomy today Heparin GTT Vascular and pulmonary consults Telemetry monitoring N.p.o. after midnight 12/18 in anticipation of pulmonary thrombectomy tomorrow  Sinus tachycardia Improved Presumed secondary to PE  Recent knee surgery No acute orthopedic issues    DVT prophylaxis: Heparin GTT Code Status: Full Family Communication: None today Disposition Plan: Status is: Inpatient  Remains inpatient appropriate because: Acute PE on heparin gtt.       Level of care: Telemetry Medical  Consultants:  Vascul hopes subjective: Seen and examined.  Resting comfortably in bed.  No visible distress.  Normal work of breathing.  No pain complaints  Objective: Vitals:   07/09/21 1405 07/09/21 1410 07/09/21 1415 07/09/21 1420  BP:      Pulse:      Resp:      Temp:      TempSrc:      SpO2: 92% 93% 91% 94%  Weight:      Height:        Intake/Output Summary (Last 24 hours) at 07/09/2021 1430 Last data filed at 07/09/2021 1100 Gross per 24 hour  Intake 313.87 ml  Output 900 ml  Net -586.13 ml   Filed Weights   07/07/21 1607 07/09/21 0600 07/09/21 1315  Weight: 99.8 kg 103.5 kg 99.8 kg    Examination:  General exam: No acute distress Respiratory system: Lungs clear.  Normal work of breathing.  1 L Cardiovascular system: S1-S2, RRR, no murmurs, no pedal edema Gastrointestinal system: Abdomen is nondistended, soft and nontender. No organomegaly or masses felt. Normal bowel sounds heard. Central nervous system: Alert and oriented. No focal neurological deficits. Extremities: Symmetric 5 x 5 power.  Left lower extremity slightly swollen Skin: No rashes, lesions  or ulcers Psychiatry: Judgement and insight appear normal. Mood & affect appropriate.     Data Reviewed: I have personally reviewed following labs and imaging studies  CBC: Recent Labs  Lab 07/07/21 1615 07/08/21 0429 07/09/21 0353  WBC 10.8* 9.5 7.4  NEUTROABS 8.6*  --   --   HGB 13.9 13.6 12.4  HCT 40.6 39.3 36.4  MCV 94.4 93.6 92.6  PLT 185 173 974*   Basic Metabolic Panel: Recent Labs  Lab 07/07/21 1615 07/08/21 0429  NA 134* 140  K 4.0 4.2  CL 104 111  CO2 21* 22  GLUCOSE 173* 110*  BUN 17 12  CREATININE 0.86 0.74  CALCIUM 9.1 8.6*   GFR: Estimated Creatinine Clearance: 83.2 mL/min (by C-G formula based on SCr of 0.74 mg/dL). Liver Function Tests: Recent Labs  Lab 07/07/21 1615  AST 23  ALT 21  ALKPHOS 91  BILITOT 0.5  PROT 7.3  ALBUMIN 3.6   No results for input(s): LIPASE, AMYLASE in the last 168 hours. No results for input(s): AMMONIA in the last 168 hours. Coagulation Profile: Recent Labs  Lab 07/07/21 1615  INR 1.1   Cardiac Enzymes: No results for input(s): CKTOTAL, CKMB, CKMBINDEX, TROPONINI in the last 168 hours. BNP (last 3 results) No results for input(s): PROBNP in the last 8760 hours. HbA1C: No results for input(s): HGBA1C in the last 72 hours. CBG: Recent Labs  Lab 07/07/21 2329  GLUCAP 110*   Lipid Profile: No results for input(s): CHOL, HDL, LDLCALC, TRIG, CHOLHDL, LDLDIRECT in the last 72 hours. Thyroid Function Tests: No results for input(s): TSH, T4TOTAL, FREET4, T3FREE, THYROIDAB in the last 72 hours. Anemia Panel: No results for input(s): VITAMINB12, FOLATE, FERRITIN, TIBC, IRON, RETICCTPCT in the last 72 hours. Sepsis Labs: Recent Labs  Lab 07/07/21 1615 07/07/21 1820  LATICACIDVEN 3.1* 2.4*    Recent Results (from the past 240 hour(s))  Resp Panel by RT-PCR (Flu A&B, Covid) Nasopharyngeal Swab     Status: None   Collection Time: 07/07/21  4:15 PM   Specimen: Nasopharyngeal Swab; Nasopharyngeal(NP) swabs in  vial transport medium  Result Value Ref Range Status   SARS Coronavirus 2 by RT PCR NEGATIVE NEGATIVE Final    Comment: (NOTE) SARS-CoV-2 target nucleic acids are NOT DETECTED.  The SARS-CoV-2 RNA is generally detectable in upper respiratory specimens during the acute phase of infection. The lowest concentration of SARS-CoV-2 viral copies this assay can detect is 138 copies/mL. A negative result does not preclude SARS-Cov-2 infection and should not be used as the sole basis for treatment or other patient management decisions. A negative result may occur with  improper specimen collection/handling, submission of specimen other than nasopharyngeal swab, presence of viral mutation(s) within the areas targeted by this assay, and inadequate number of viral copies(<138 copies/mL). A negative result must be combined with clinical observations, patient history, and epidemiological information. The expected result is Negative.  Fact Sheet for Patients:  EntrepreneurPulse.com.au  Fact Sheet for Healthcare Providers:  IncredibleEmployment.be  This test is no t yet approved or cleared by the [place] FDA and  has been authorized for detection and/or diagnosis of SARS-CoV-2  by FDA under an Emergency Use Authorization (EUA). This EUA will remain  in effect (meaning this test can be used) for the duration of the COVID-19 declaration under Section 564(b)(1) of the Act, 21 U.S.C.section 360bbb-3(b)(1), unless the authorization is terminated  or revoked sooner.       Influenza A by PCR NEGATIVE NEGATIVE Final   Influenza B by PCR NEGATIVE NEGATIVE Final    Comment: (NOTE) The Xpert Xpress SARS-CoV-2/FLU/RSV plus assay is intended as an aid in the diagnosis of influenza from Nasopharyngeal swab specimens and should not be used as a sole basis for treatment. Nasal washings and aspirates are unacceptable for Xpert Xpress SARS-CoV-2/FLU/RSV testing.  Fact  Sheet for Patients: EntrepreneurPulse.com.au  Fact Sheet for Healthcare Providers: IncredibleEmployment.be  This test is not yet approved or cleared by the [place] FDA and has been authorized for detection and/or diagnosis of SARS-CoV-2 by FDA under an Emergency Use Authorization (EUA). This EUA will remain in effect (meaning this test can be used) for the duration of the COVID-19 declaration under Section 564(b)(1) of the Act, 21 U.S.C. section 360bbb-3(b)(1), unless the authorization is terminated or revoked.  Performed at [facility], [address] [place], [place] [address]   Blood Culture (routine x 2)     Status: None (Preliminary result)   Collection Time: 07/07/21  4:15 PM   Specimen: BLOOD  Result Value Ref Range Status   Specimen Description BLOOD BLOOD RIGHT ARM  Final   Special Requests   Final    BOTTLES DRAWN AEROBIC AND ANAEROBIC Blood Culture results may not be optimal due to an inadequate volume of blood received in culture bottles   Culture   Final    NO GROWTH 2 DAYS Performed at [facility], [address] [place], [place] [address]    Report Status PENDING  Incomplete  Blood Culture (routine x 2)     Status: None (Preliminary result)   Collection Time: 07/07/21  4:15 PM   Specimen: BLOOD  Result Value Ref Range Status   Specimen Description BLOOD BLOOD RIGHT WRIST  Final   Special Requests   Final    BOTTLES DRAWN AEROBIC AND ANAEROBIC Blood Culture adequate volume   Culture   Final    NO GROWTH 2 DAYS Performed at [facility], [address] [place], [place] [address]    Report Status PENDING  Incomplete         Radiology Studies: CT Angio Chest PE W and/or Wo Contrast  Result Date: 07/07/2021 CLINICAL DATA:  Pulmonary embolism (PE) suspected, high prob EXAM: CT ANGIOGRAPHY CHEST WITH CONTRAST TECHNIQUE: Multidetector CT imaging of the chest was performed using the  standard protocol during bolus administration of intravenous contrast. Multiplanar CT image reconstructions and MIPs were obtained to evaluate the vascular anatomy. CONTRAST:  65mL OMNIPAQUE IOHEXOL 350 MG/ML SOLN COMPARISON:  None. FINDINGS: Cardiovascular: Positive for pulmonary emboli. There is a large clot burden within the right and left branch pulmonary arteries, extending into the bilateral lobar, segmental, and subsegmental branches of each lobe. The right atrium and right ventricle are dilated with RV-LV ratio measuring 1.6, consistent with right heart strain. The thoracic aorta demonstrates mild atherosclerotic calcifications. No pericardial disease. Mediastinum/Nodes: No mediastinal, hilar, or axillary lymphadenopathy. Thyroid is unremarkable. Tiny hiatal hernia. Lungs/Pleura: No focal airspace consolidation. Bibasilar hypoventilatory changes. No suspicious pulmonary nodules or masses. No pleural effusion. No pneumothorax. Upper Abdomen: Tiny left hepatic cyst.  No acute findings. Musculoskeletal: No acute osseous  abnormality. No suspicious lytic or blastic lesions. Review of the MIP images confirms the above findings. IMPRESSION: Acute pulmonary emboli with extensive clot burden bilaterally as described above, and CT evidence of right heart strain. Positive for acute PE with CT evidence of right heart strain (RV/LV Ratio = 1.6) consistent with at least submassive (intermediate risk) PE. The presence of right heart strain has been associated with an increased risk of morbidity and mortality. Please refer to the "PE Focused" order set in EPIC. Critical Value/emergent results were called by telephone at the time of interpretation on 07/07/2021 at 5:40 pm to provider Merlyn Lot , who verbally acknowledged these results. Electronically Signed   By: Maurine Simmering M.D.   On: 07/07/2021 17:46   US Venous Img Lower Unilateral Left  Result Date: 07/07/2021 CLINICAL DATA:  Left lower extremity pain and  swelling, history of recent knee surgery, initial encounter EXAM: LEFT LOWER EXTREMITY VENOUS DOPPLER ULTRASOUND TECHNIQUE: Gray-scale sonography with graded compression, as well as color Doppler and duplex ultrasound were performed to evaluate the lower extremity deep venous systems from the level of the common femoral vein and including the common femoral, femoral, profunda femoral, popliteal and calf veins including the posterior tibial, peroneal and gastrocnemius veins when visible. The superficial great saphenous vein was also interrogated. Spectral Doppler was utilized to evaluate flow at rest and with distal augmentation maneuvers in the common femoral, femoral and popliteal veins. COMPARISON:  None. FINDINGS: Contralateral Common Femoral Vein: Respiratory phasicity is normal and symmetric with the symptomatic side. No evidence of thrombus. Normal compressibility. Common Femoral Vein: No evidence of thrombus. Normal compressibility, respiratory phasicity and response to augmentation. Saphenofemoral Junction: No evidence of thrombus. Normal compressibility and flow on color Doppler imaging. Profunda Femoral Vein: No evidence of thrombus. Normal compressibility and flow on color Doppler imaging. Femoral Vein: No evidence of thrombus. Normal compressibility, respiratory phasicity and response to augmentation. Popliteal Vein: Thrombus is noted with decreased compressibility. Calf Veins: Thrombus is noted within the posterior tibial vein. The peroneal vein is not well visualized. Superficial Great Saphenous Vein: No evidence of thrombus. Normal compressibility. Venous Reflux:  None. Other Findings:  None. IMPRESSION: Thrombus is noted within the popliteal and posterior tibial veins. Electronically Signed   By: Inez Catalina M.D.   On: 07/07/2021 18:27   DG Chest Port 1 View  Result Date: 07/07/2021 CLINICAL DATA:  Clinical concern for sepsis. Four weeks status post left knee meniscal surgery. EXAM: PORTABLE  CHEST 1 VIEW COMPARISON:  None. FINDINGS: Cardiac silhouette normal in size.  No mediastinal or hilar masses. Clear lungs.  No pleural effusion or pneumothorax. Skeletal structures are grossly intact. IMPRESSION: No active disease. Electronically Signed   By: Lajean Manes M.D.   On: 07/07/2021 16:41   ECHOCARDIOGRAM COMPLETE  Result Date: 07/08/2021    ECHOCARDIOGRAM REPORT   Patient Name:   CLOVIS WARWICK Date of Exam: 07/08/2021 Medical Rec #:  [id number]    Height:       68.0 in Accession #:    [id number]   Weight:       220.0 lb Date of Birth:  September 27, 1950    BSA:          2.128 m Patient Age:    49 years     BP:           148/87 mmHg Patient Gender: F            HR:  90 bpm. Exam Location:  [facility] Procedure: 2D Echo and Intracardiac Opacification Agent Indications:     Pulmonary Embolus I26.09  History:         Patient has no prior history of Echocardiogram examinations.  Sonographer:     Kathlen Brunswick RDCS Referring Phys:  [id number] AMY N COX Diagnosing Phys: Serafina Royals MD  Sonographer Comments: Technically difficult study due to poor echo windows. IMPRESSIONS  1. Left ventricular ejection fraction, by estimation, is 60 to 65%. The left ventricle has normal function. The left ventricle has no regional wall motion abnormalities. Left ventricular diastolic parameters were normal.  2. Right ventricular systolic function is mildly reduced. The right ventricular size is severely enlarged. Mildly increased right ventricular wall thickness. There is severely elevated pulmonary artery systolic pressure.  3. Right atrial size was moderately dilated.  4. The mitral valve is normal in structure. Trivial mitral valve regurgitation.  5. Tricuspid valve regurgitation is moderate to severe.  6. The aortic valve is normal in structure. Aortic valve regurgitation is not visualized. FINDINGS  Left Ventricle: Left ventricular ejection fraction, by estimation, is 60 to 65%. The left ventricle has normal function.  The left ventricle has no regional wall motion abnormalities. Definity contrast agent was given IV to delineate the left ventricular  endocardial borders. The left ventricular internal cavity size was small. There is no left ventricular hypertrophy. Left ventricular diastolic parameters were normal. Right Ventricle: The right ventricular size is severely enlarged. Mildly increased right ventricular wall thickness. Right ventricular systolic function is mildly reduced. There is severely elevated pulmonary artery systolic pressure. Left Atrium: Left atrial size was normal in size. Right Atrium: Right atrial size was moderately dilated. Pericardium: There is no evidence of pericardial effusion. Mitral Valve: The mitral valve is normal in structure. Trivial mitral valve regurgitation. Tricuspid Valve: The tricuspid valve is normal in structure. Tricuspid valve regurgitation is moderate to severe. Aortic Valve: The aortic valve is normal in structure. Aortic valve regurgitation is not visualized. Aortic valve peak gradient measures 4.5 mmHg. Pulmonic Valve: The pulmonic valve was normal in structure. Pulmonic valve regurgitation is mild. Aorta: The aortic root and ascending aorta are structurally normal, with no evidence of dilitation. IAS/Shunts: No atrial level shunt detected by color flow Doppler.  LEFT VENTRICLE PLAX 2D LVIDd:         4.00 cm Diastology LVIDs:         2.80 cm LV e' medial:    6.20 cm/s LV PW:         1.20 cm LV E/e' medial:  8.0 LV IVS:        1.20 cm LV e' lateral:   5.98 cm/s                        LV E/e' lateral: 8.3  RIGHT VENTRICLE RV Basal diam:  5.00 cm RV S prime:     7.94 cm/s TAPSE (M-mode): 1.1 cm LEFT ATRIUM           Index        RIGHT ATRIUM           Index LA diam:      2.80 cm 1.32 cm/m   RA Area:     24.50 cm LA Vol (A4C): 45.1 ml 21.19 ml/m  RA Volume:   91.20 ml  42.85 ml/m  AORTIC VALVE AV Vmax:      106.00 cm/s AV Peak Grad: 4.5 mmHg LVOT Vmax:  75.10 cm/s LVOT Vmean:    49.100 cm/s LVOT VTI:     0.129 m MITRAL VALVE               TRICUSPID VALVE MV Area (PHT): 8.34 cm    TV Peak grad:   36.2 mmHg MV Decel Time: 91 msec     TV Vmax:        3.01 m/s MV E velocity: 49.70 cm/s MV A velocity: 39.00 cm/s  SHUNTS MV E/A ratio:  1.27        Systemic VTI: 0.13 m Serafina Royals MD Electronically signed by Serafina Royals MD Signature Date/Time: 07/08/2021/3:20:41 PM    Final         Scheduled Meds:  [OYW Hold] Chlorhexidine Gluconate Cloth  6 each Topical Daily   [id number]       midazolam       Continuous Infusions:  sodium chloride     sodium chloride 75 mL/hr at 07/09/21 1321   heparin 1,250 Units/hr (07/09/21 0359)     LOS: 2 days    Time spent: 25 minutes    Sidney Ace, MD [facility]   If 7PM-7AM, please contact night-coverage  07/09/2021, 2:30 PM

## 2021-07-09 NOTE — H&P (View-Only) (Signed)
Sandra Phillips  MRN : [id number]  Sandra Phillips is a 70 y.o. ([birth year]) female who presents with chief complaint of  Chief Complaint  Patient presents with   Code Sepsis   History of Present Illness:  Sandra Phillips is a 70 y.o. female with medical history significant for primary osteoarthritis of the left knee, complex tear of the medial meniscus of the left knee status post left knee arthroscopy with debridement and repair of the posterior medial root tear and abrasion  chondoplasty who presents to the emergency department from home for chief concerns of shortness of breath.   She noticed swelling of her left foot on Tuesday and/or Wednesday (07/04/21).  She endorses compliance with aspirin 325 mg daily.  She states she she has not missed a single dose.  She reports that since the operation, she is mainly sitting in a chair and/or in bed laying down as movement is difficult for her.   She states that she noticed shortness of breath that started approximately 5:30 PM on 12/16 after an intense first physical therapy session. She she states that the shortness of breath improved with resting at home and she slept well on evening of 07/06/2021. Today, she noticed worsening dyspnea with exertion, worsening shortness of breath with rest, diaphoretic, and nausea and vomiting.  She only had Pedialyte today prior to presentation to the ED.   These acute symptoms prompted her to present to the emergency department for further evaluation.   She denies chest pain. She endorses constipation.  She denies diarrhea.    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  Vascular surgery was consulted by Dr. Priscella Slutsky in the setting of pulmonary embolism and possible vascular intervention.  Current Facility-Administered Medications  Medication Dose Route Frequency Provider Last Rate Last Admin   0.9 %  sodium  chloride infusion   Intravenous Continuous Glynna Failla A, PA-C       acetaminophen (TYLENOL) tablet 650 mg  650 mg Oral Q6H PRN Cox, Amy N, DO       Or   acetaminophen (TYLENOL) suppository 650 mg  650 mg Rectal Q6H PRN Cox, Amy N, DO       Chlorhexidine Gluconate Cloth 2 % PADS 6 each  6 each Topical Daily Sreenath, Sudheer B, MD       heparin ADULT infusion 100 units/mL (25000 units/23mL)  1,250 Units/hr Intravenous Continuous Renda Rolls, RPH 12.5 mL/hr at 07/09/21 0359 1,250 Units/hr at 07/09/21 0359   hydrALAZINE (APRESOLINE) injection 10 mg  10 mg Intravenous Q6H PRN Cox, Amy N, DO       lip balm (BLISTEX) ointment   Topical PRN Sreenath, Sudheer B, MD       ondansetron (ZOFRAN) tablet 4 mg  4 mg Oral Q6H PRN Cox, Amy N, DO       Or   ondansetron (ZOFRAN) injection 4 mg  4 mg Intravenous Q6H PRN Cox, Amy N, DO       traMADol (ULTRAM) tablet 50 mg  50 mg Oral Q6H PRN Cox, Amy N, DO       Past Medical History:  Diagnosis Date   Primary osteoarthritis    Past Surgical History:  Procedure Laterality Date   KNEE ARTHROSCOPY Left 06/12/2021   Procedure: LEFT KNEE ARTHROSCOPY WITH DEBRIDEMENT AND REPAIR OF A POSTERIOR MEDIAL ROOT TEAR AND ABRASION CONDOPLASTY;  Surgeon: Corky Mull, MD;  Location:  [facility] ORS;  Service: Orthopedics;  Laterality: Left;   Social History Social History   Tobacco Use   Smoking status: Never   Smokeless tobacco: Never  Substance Use Topics   Alcohol use: Yes    Alcohol/week: 1.0 standard drink    Types: 1 Glasses of wine per week    Comment: a glass at dinner   Drug use: Never  She lives with her partner, Sandra Phillips.  She denies any tobacco, recreational drug use.  She endorses infrequent EtOH, social drinking or with dinner.  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   Family History Family History  Problem Relation Age of Onset   Cancer Mother    Rheum arthritis Sister   Denies family history of  bleeding/clotting disorders, peripheral artery disease or venous disease.  Allergies  Allergen Reactions   Penicillins     Unknown childhood reaction    Shrimp Flavor Swelling   REVIEW OF SYSTEMS (Negative unless checked)  Constitutional: [] Weight loss  [] Fever  [] Chills Cardiac: [] Chest pain   [] Chest pressure   [] Palpitations   [x] Shortness of breath when laying flat   [x] Shortness of breath at rest   [x] Shortness of breath with exertion. Vascular:  [] Pain in legs with walking   [] Pain in legs at rest   [] Pain in legs when laying flat   [] Claudication   [] Pain in feet when walking  [] Pain in feet at rest  [] Pain in feet when laying flat   [] History of DVT   [] Phlebitis   [] Swelling in legs   [] Varicose veins   [] Non-healing ulcers Pulmonary:   [] Uses home oxygen   [] Productive cough   [] Hemoptysis   [] Wheeze  [] COPD   [] Asthma Neurologic:  [] Dizziness  [] Blackouts   [] Seizures   [] History of stroke   [] History of TIA  [] Aphasia   [] Temporary blindness   [] Dysphagia   [] Weakness or numbness in arms   [] Weakness or numbness in legs Musculoskeletal:  [] Arthritis   [] Joint swelling   [] Joint pain   [] Low back pain Hematologic:  [] Easy bruising  [] Easy bleeding   [] Hypercoagulable state   [] Anemic  [] Hepatitis Gastrointestinal:  [] Blood in stool   [] Vomiting blood  [] Gastroesophageal reflux/heartburn   [] Difficulty swallowing. Genitourinary:  [] Chronic kidney disease   [] Difficult urination  [] Frequent urination  [] Burning with urination   [] Blood in urine Skin:  [] Rashes   [] Ulcers   [] Wounds Psychological:  [] History of anxiety   []  History of major depression.  Physical Examination  Vitals:   07/09/21 0700 07/09/21 0800 07/09/21 0900 07/09/21 1000  BP: 104/72 134/89 (!) 156/83 139/88  Pulse: 65 78 75 71  Resp: 13 20 12  (!) 23  Temp:   98.4 F (36.9 C)   TempSrc:   Oral   SpO2: 97% 97% 96% 96%  Weight:      Height:       Body mass index is 34.69 kg/m. Gen:  WD/WN, NAD Head:  [place]/AT, No temporalis wasting. Prominent temp pulse not noted. Ear/Nose/Throat: Hearing grossly intact, nares w/o erythema or drainage, oropharynx w/o Erythema/Exudate Eyes: Sclera non-icteric, conjunctiva clear Neck: Trachea midline.  No JVD.  Pulmonary: Becomes short of breath with talking.  Bilateral lung sounds diminished. Cardiac: RRR, normal S1, S2. Vascular:  Vessel Right Left  Radial Palpable Palpable  Ulnar Palpable Palpable  Brachial Palpable Palpable  Carotid Palpable, without bruit Palpable, without bruit  Aorta Not palpable N/A  Femoral Palpable Palpable  Popliteal Palpable Palpable  PT Palpable Palpable  DP Palpable Palpable   Left lower extremity: Thigh soft.  Calf soft.  Extremities warm distally toes.  Minimal edema.  Nontender to palpation.  There is no acute vascular compromise noted to the extremity at this time.  Gastrointestinal: soft, non-tender/non-distended. No guarding/reflex.  Musculoskeletal: M/S 5/5 throughout.  Extremities without ischemic changes.  No deformity or atrophy. No edema. Neurologic: Sensation grossly intact in extremities.  Symmetrical.  Speech is fluent. Motor exam as listed above. Psychiatric: Judgment intact, Mood & affect appropriate for pt's clinical situation. Dermatologic: No rashes or ulcers noted.  No cellulitis or open wounds. Lymph : No Cervical, Axillary, or Inguinal lymphadenopathy.  CBC Lab Results  Component Value Date   WBC 7.4 07/09/2021   HGB 12.4 07/09/2021   HCT 36.4 07/09/2021   MCV 92.6 07/09/2021   PLT 148 (L) 07/09/2021   BMET    Component Value Date/Time   NA 140 07/08/2021 0429   NA 141 09/23/2012 0735   K 4.2 07/08/2021 0429   K 3.9 09/23/2012 0735   CL 111 07/08/2021 0429   CL 106 09/23/2012 0735   CO2 22 07/08/2021 0429   CO2 25 09/23/2012 0735   GLUCOSE 110 (H) 07/08/2021 0429   GLUCOSE 142 (H) 09/23/2012 0735   BUN 12 07/08/2021 0429   BUN 11 09/23/2012 0735   CREATININE 0.74 07/08/2021 0429    CREATININE 0.98 09/23/2012 0735   CALCIUM 8.6 (L) 07/08/2021 0429   CALCIUM 9.0 09/23/2012 0735   GFRNONAA >60 07/08/2021 0429   GFRNONAA >60 09/23/2012 0735   GFRAA >60 09/23/2012 0735   Estimated Creatinine Clearance: 84.7 mL/min (by C-G formula based on SCr of 0.74 mg/dL).  COAG Lab Results  Component Value Date   INR 1.1 07/07/2021   Radiology CT Angio Chest PE W and/or Wo Contrast  Result Date: 07/07/2021 CLINICAL DATA:  Pulmonary embolism (PE) suspected, high prob EXAM: CT ANGIOGRAPHY CHEST WITH CONTRAST TECHNIQUE: Multidetector CT imaging of the chest was performed using the standard protocol during bolus administration of intravenous contrast. Multiplanar CT image reconstructions and MIPs were obtained to evaluate the vascular anatomy. CONTRAST:  3mL OMNIPAQUE IOHEXOL 350 MG/ML SOLN COMPARISON:  None. FINDINGS: Cardiovascular: Positive for pulmonary emboli. There is a large clot burden within the right and left branch pulmonary arteries, extending into the bilateral lobar, segmental, and subsegmental branches of each lobe. The right atrium and right ventricle are dilated with RV-LV ratio measuring 1.6, consistent with right heart strain. The thoracic aorta demonstrates mild atherosclerotic calcifications. No pericardial disease. Mediastinum/Nodes: No mediastinal, hilar, or axillary lymphadenopathy. Thyroid is unremarkable. Tiny hiatal hernia. Lungs/Pleura: No focal airspace consolidation. Bibasilar hypoventilatory changes. No suspicious pulmonary nodules or masses. No pleural effusion. No pneumothorax. Upper Abdomen: Tiny left hepatic cyst.  No acute findings. Musculoskeletal: No acute osseous abnormality. No suspicious lytic or blastic lesions. Review of the MIP images confirms the above findings. IMPRESSION: Acute pulmonary emboli with extensive clot burden bilaterally as described above, and CT evidence of right heart strain. Positive for acute PE with CT evidence of right heart strain  (RV/LV Ratio = 1.6) consistent with at least submassive (intermediate risk) PE. The presence of right heart strain has been associated with an increased risk of morbidity and mortality. Please refer to the "PE Focused" order set in EPIC. Critical Value/emergent results were called by telephone at the time of interpretation on 07/07/2021 at 5:40 pm to provider Merlyn Lot , who verbally acknowledged these results. Electronically Signed  By: Maurine Simmering M.D.   On: 07/07/2021 17:46   US Venous Img Lower Unilateral Left  Result Date: 07/07/2021 CLINICAL DATA:  Left lower extremity pain and swelling, history of recent knee surgery, initial encounter EXAM: LEFT LOWER EXTREMITY VENOUS DOPPLER ULTRASOUND TECHNIQUE: Gray-scale sonography with graded compression, as well as color Doppler and duplex ultrasound were performed to evaluate the lower extremity deep venous systems from the level of the common femoral vein and including the common femoral, femoral, profunda femoral, popliteal and calf veins including the posterior tibial, peroneal and gastrocnemius veins when visible. The superficial great saphenous vein was also interrogated. Spectral Doppler was utilized to evaluate flow at rest and with distal augmentation maneuvers in the common femoral, femoral and popliteal veins. COMPARISON:  None. FINDINGS: Contralateral Common Femoral Vein: Respiratory phasicity is normal and symmetric with the symptomatic side. No evidence of thrombus. Normal compressibility. Common Femoral Vein: No evidence of thrombus. Normal compressibility, respiratory phasicity and response to augmentation. Saphenofemoral Junction: No evidence of thrombus. Normal compressibility and flow on color Doppler imaging. Profunda Femoral Vein: No evidence of thrombus. Normal compressibility and flow on color Doppler imaging. Femoral Vein: No evidence of thrombus. Normal compressibility, respiratory phasicity and response to augmentation. Popliteal  Vein: Thrombus is noted with decreased compressibility. Calf Veins: Thrombus is noted within the posterior tibial vein. The peroneal vein is not well visualized. Superficial Great Saphenous Vein: No evidence of thrombus. Normal compressibility. Venous Reflux:  None. Other Findings:  None. IMPRESSION: Thrombus is noted within the popliteal and posterior tibial veins. Electronically Signed   By: Inez Catalina M.D.   On: 07/07/2021 18:27   DG Chest Port 1 View  Result Date: 07/07/2021 CLINICAL DATA:  Clinical concern for sepsis. Four weeks status post left knee meniscal surgery. EXAM: PORTABLE CHEST 1 VIEW COMPARISON:  None. FINDINGS: Cardiac silhouette normal in size.  No mediastinal or hilar masses. Clear lungs.  No pleural effusion or pneumothorax. Skeletal structures are grossly intact. IMPRESSION: No active disease. Electronically Signed   By: Lajean Manes M.D.   On: 07/07/2021 16:41   ECHOCARDIOGRAM COMPLETE  Result Date: 07/08/2021    ECHOCARDIOGRAM REPORT   Patient Name:   Sandra Phillips Date of Exam: 07/08/2021 Medical Rec #:  [id number]    Height:       68.0 in Accession #:    [id number]   Weight:       220.0 lb Date of Birth:  1951-05-17    BSA:          2.128 m Patient Age:    55 years     BP:           148/87 mmHg Patient Gender: F            HR:           90 bpm. Exam Location:  [facility] Procedure: 2D Echo and Intracardiac Opacification Agent Indications:     Pulmonary Embolus I26.09  History:         Patient has no prior history of Echocardiogram examinations.  Sonographer:     Kathlen Brunswick RDCS Referring Phys:  [id number] AMY N COX Diagnosing Phys: Serafina Royals MD  Sonographer Comments: Technically difficult study due to poor echo windows. IMPRESSIONS  1. Left ventricular ejection fraction, by estimation, is 60 to 65%. The left ventricle has normal function. The left ventricle has no regional wall motion abnormalities. Left ventricular diastolic parameters were normal.  2. Right ventricular  systolic function is mildly  reduced. The right ventricular size is severely enlarged. Mildly increased right ventricular wall thickness. There is severely elevated pulmonary artery systolic pressure.  3. Right atrial size was moderately dilated.  4. The mitral valve is normal in structure. Trivial mitral valve regurgitation.  5. Tricuspid valve regurgitation is moderate to severe.  6. The aortic valve is normal in structure. Aortic valve regurgitation is not visualized. FINDINGS  Left Ventricle: Left ventricular ejection fraction, by estimation, is 60 to 65%. The left ventricle has normal function. The left ventricle has no regional wall motion abnormalities. Definity contrast agent was given IV to delineate the left ventricular  endocardial borders. The left ventricular internal cavity size was small. There is no left ventricular hypertrophy. Left ventricular diastolic parameters were normal. Right Ventricle: The right ventricular size is severely enlarged. Mildly increased right ventricular wall thickness. Right ventricular systolic function is mildly reduced. There is severely elevated pulmonary artery systolic pressure. Left Atrium: Left atrial size was normal in size. Right Atrium: Right atrial size was moderately dilated. Pericardium: There is no evidence of pericardial effusion. Mitral Valve: The mitral valve is normal in structure. Trivial mitral valve regurgitation. Tricuspid Valve: The tricuspid valve is normal in structure. Tricuspid valve regurgitation is moderate to severe. Aortic Valve: The aortic valve is normal in structure. Aortic valve regurgitation is not visualized. Aortic valve peak gradient measures 4.5 mmHg. Pulmonic Valve: The pulmonic valve was normal in structure. Pulmonic valve regurgitation is mild. Aorta: The aortic root and ascending aorta are structurally normal, with no evidence of dilitation. IAS/Shunts: No atrial level shunt detected by color flow Doppler.  LEFT VENTRICLE PLAX 2D  LVIDd:         4.00 cm Diastology LVIDs:         2.80 cm LV e' medial:    6.20 cm/s LV PW:         1.20 cm LV E/e' medial:  8.0 LV IVS:        1.20 cm LV e' lateral:   5.98 cm/s                        LV E/e' lateral: 8.3  RIGHT VENTRICLE RV Basal diam:  5.00 cm RV S prime:     7.94 cm/s TAPSE (M-mode): 1.1 cm LEFT ATRIUM           Index        RIGHT ATRIUM           Index LA diam:      2.80 cm 1.32 cm/m   RA Area:     24.50 cm LA Vol (A4C): 45.1 ml 21.19 ml/m  RA Volume:   91.20 ml  42.85 ml/m  AORTIC VALVE AV Vmax:      106.00 cm/s AV Peak Grad: 4.5 mmHg LVOT Vmax:    75.10 cm/s LVOT Vmean:   49.100 cm/s LVOT VTI:     0.129 m MITRAL VALVE               TRICUSPID VALVE MV Area (PHT): 8.34 cm    TV Peak grad:   36.2 mmHg MV Decel Time: 91 msec     TV Vmax:        3.01 m/s MV E velocity: 49.70 cm/s MV A velocity: 39.00 cm/s  SHUNTS MV E/A ratio:  1.27        Systemic VTI: 0.13 m Serafina Royals MD Electronically signed by Serafina Royals MD Signature Date/Time: 07/08/2021/3:20:41 PM  Final     Assessment/Plan Sandra Phillips is a 70 y.o. female with medical history significant for primary osteoarthritis of the left knee, complex tear of the medial meniscus of the left knee status post left knee arthroscopy with debridement and repair of the posterior medial root tear and abrasion  chondoplasty who presents to the emergency department from home for chief concerns of shortness of breath found to have pulmonary embolism and left lower extremity DVT.  1.  Pulmonary embolism: Patient presented to the [facility] emergency department with progressively worsening shortness of breath.  Her symptoms worsen with ambulation and speaking.  Right heart strain noted on CTA chest.  Recommend undergoing a pulmonary thrombectomy possible thrombolysis however the patient did have recent orthopedic surgery this month in an attempt to lessen her clot burden and improve her symptoms as well as her right  heart strain.  Procedure, risk and benefits were explained to the patient.  All questions were answered.  The patient wished to proceed.  We will plan on this today with Dr. Lucky Cowboy.  2.  Left lower extremity DVT: Venous duplex was notable for thrombus within the popliteal and posterior tibial veins. At this time, the patient is asymptomatic and denies any swelling or pain. I explained that we do have a 2-week.  If her symptoms should worsen to revisit a peripheral thrombectomy. We will continue to follow her DVT in the outpatient setting.  3.  Need for long-term anticoagulation: Patient is currently on a heparin drip. She understands that with a diagnosis of pulmonary embolism she will be on oral anticoagulation for approximately 1 year. She expresses her understanding.  Discussed with Dr. Mayme Genta, PA-C 07/09/2021 11:12 AM  This Phillips was created with Dragon medical transcription system.  Any error is purely unintentional.

## 2021-07-09 NOTE — Progress Notes (Signed)
ANTICOAGULATION CONSULT NOTE  Pharmacy Consult for heparin infusion Indication: pulmonary embolus  Allergies  Allergen Reactions   Penicillins     Unknown childhood reaction    Shrimp Flavor Swelling    Patient Measurements: Height: 5\' 8"  (172.7 cm) Weight: 99.8 kg (220 lb) IBW/kg (Calculated) : 63.9 Heparin Dosing Weight: 86.5 kg  Vital Signs: BP: 100/71 (12/19 0200) Pulse Rate: 73 (12/19 0200)  Labs: Recent Labs    07/07/21 1615 07/07/21 1715 07/07/21 2349 07/08/21 0429 07/08/21 0946 07/08/21 1606 07/09/21 0353  HGB 13.9  --   --  13.6  --   --  12.4  HCT 40.6  --   --  39.3  --   --  36.4  PLT 185  --   --  173  --   --  148*  APTT 28  --   --   --   --   --   --   LABPROT 14.6  --   --   --   --   --   --   INR 1.1  --   --   --   --   --   --   HEPARINUNFRC  --   --    < >  --  0.55 0.46 0.48  CREATININE 0.86  --   --  0.74  --   --   --   TROPONINIHS 491* 970*  --  419* 278*  --   --    < > = values in this interval not displayed.     Estimated Creatinine Clearance: 83.2 mL/min (by C-G formula based on SCr of 0.74 mg/dL).   Medical History: Past Medical History:  Diagnosis Date   Primary osteoarthritis     Medications:  Per chart review, no anticoagulation noted prior to admission  Assessment: 70 y.o. female with history of recent knee surgery presents the ER for shortness of breath. Pharmacy has been consulted for heparin dosing/monitoring. Baseline CBC WNL.   12/18 0946 HL 0.55  12/18 1606 HL 0.46 12/19 0353 HL 0.48  Goal of Therapy:  Heparin level 0.3-0.7 units/ml Monitor platelets by anticoagulation protocol: Yes   Plan:  Heparin level is therapeutic. Will continue heparin infusion at 1250 units/hr. Recheck heparin level with AM labs CBC daily while on heparin.   Renda Rolls, PharmD, [facility] 07/09/2021 5:39 AM

## 2021-07-10 ENCOUNTER — Encounter: Payer: Self-pay | Admitting: Vascular Surgery

## 2021-07-10 LAB — CBC
HCT: 35 % — ABNORMAL LOW (ref 36.0–46.0)
Hemoglobin: 12.5 g/dL (ref 12.0–15.0)
MCH: 32.4 pg (ref 26.0–34.0)
MCHC: 35.7 g/dL (ref 30.0–36.0)
MCV: 90.7 fL (ref 80.0–100.0)
Platelets: 177 10*3/uL (ref 150–400)
RBC: 3.86 MIL/uL — ABNORMAL LOW (ref 3.87–5.11)
RDW: 12.2 % (ref 11.5–15.5)
WBC: 8.8 10*3/uL (ref 4.0–10.5)
nRBC: 0 % (ref 0.0–0.2)

## 2021-07-10 LAB — HEPARIN LEVEL (UNFRACTIONATED): Heparin Unfractionated: 0.68 IU/mL (ref 0.30–0.70)

## 2021-07-10 NOTE — Progress Notes (Signed)
ANTICOAGULATION CONSULT NOTE  Pharmacy Consult for heparin infusion Indication: pulmonary embolus  Allergies  Allergen Reactions   Penicillins     Unknown childhood reaction    Shrimp Flavor Swelling    Patient Measurements: Height: 5\' 8"  (172.7 cm) Weight: 99.8 kg (220 lb) IBW/kg (Calculated) : 63.9 Heparin Dosing Weight: 86.5 kg  Vital Signs: Temp: 98.1 F (36.7 C) (12/20 0521) Temp Source: Oral (12/20 0036) BP: 126/84 (12/20 0521) Pulse Rate: 77 (12/20 0521)  Labs: Recent Labs    07/07/21 1615 07/07/21 1715 07/07/21 2349 07/08/21 0429 07/08/21 0946 07/08/21 1606 07/09/21 0353 07/10/21 0502  HGB 13.9  --   --  13.6  --   --  12.4 12.5  HCT 40.6  --   --  39.3  --   --  36.4 35.0*  PLT 185  --   --  173  --   --  148* 177  APTT 28  --   --   --   --   --   --   --   LABPROT 14.6  --   --   --   --   --   --   --   INR 1.1  --   --   --   --   --   --   --   HEPARINUNFRC  --   --    < >  --  0.55 0.46 0.48 0.68  CREATININE 0.86  --   --  0.74  --   --   --   --   TROPONINIHS 491* 970*  --  419* 278*  --   --   --    < > = values in this interval not displayed.     Estimated Creatinine Clearance: 83.2 mL/min (by C-G formula based on SCr of 0.74 mg/dL).   Medical History: Past Medical History:  Diagnosis Date   Primary osteoarthritis     Medications:  Per chart review, no anticoagulation noted prior to admission  Assessment: 70 y.o. female with history of recent knee surgery presents the ER for shortness of breath. Pharmacy has been consulted for heparin dosing/monitoring. Baseline CBC WNL.   12/18 0946 HL 0.55  12/18 1606 HL 0.46 12/19 0353 HL 0.48 12/20 0502 HL 0.68  Goal of Therapy:  Heparin level 0.3-0.7 units/ml Monitor platelets by anticoagulation protocol: Yes   Plan:  Heparin level is therapeutic. Will continue heparin infusion at 1250 units/hr. Recheck heparin level with AM labs CBC daily while on heparin.   Sherilyn Banker,  PharmD Clinical Pharmacist  07/10/2021 6:00 AM

## 2021-07-10 NOTE — TOC Initial Note (Signed)
Transition of Care [facility]) - Initial/Assessment Note    Patient Details  Name: Sandra Phillips MRN: [id number] Date of Birth: 11-09-1950  Transition of Care [facility]) CM/SW Contact:    Pete Pelt, RN Phone Number: 07/10/2021, 3:56 PM  Clinical Narrative:   Patient lives with domestic partner, who can assist with care.  Patient has partner or friends who can transport to appointments and to the pharmacy.                 Patient has no concerns about obtaining medications and is able to take as directed.   Patient originally stated she has a walker at home but states that she would rather have a 2 wheeled walker and an 3n1, [employer] notified.    Patient has no stairs at home, is comfortable returning home after discharge.  TOC contact information given, to c to follow for needs. Expected Discharge Plan: [place] Barriers to Discharge: Continued Medical Work up   Patient Goals and CMS Choice     Choice offered to / list presented to : NA  Expected Discharge Plan and Services Expected Discharge Plan: [place]   Discharge Planning Services: CM Consult Post Acute Care Choice: [place] arrangements for the past 2 months: [place]                 DME Arranged: 3-N-1, Walker rolling DME Agency: [employer] Date DME Agency Contacted: 07/10/21 Time DME Agency Contacted: [phone] Representative spoke with at DME Agency: [place]: PT, OT [place] Agency: [place] ([place]) Date [place]: 07/10/21   Representative spoke with at [place]: Corene Cornea  Prior Living Arrangements/Services Living arrangements for the past 2 months: [place] with:: Self, Domestic Partner Patient language and need for interpreter reviewed:: Yes (No interpreter required) Do you feel safe going back to the place where you live?: Yes      Need for Family Participation in Patient Care: Yes (Comment) Care giver support  system in place?: Yes (comment)   Criminal Activity/Legal Involvement Pertinent to Current Situation/Hospitalization: No - Comment as needed  Activities of Daily Living   ADL Screening (condition at time of admission) Patient's cognitive ability adequate to safely complete daily activities?: Yes Is the patient deaf or have difficulty hearing?: No Does the patient have difficulty seeing, even when wearing glasses/contacts?: No Does the patient have difficulty concentrating, remembering, or making decisions?: No Patient able to express need for assistance with ADLs?: Yes Does the patient have difficulty dressing or bathing?: Yes Independently performs ADLs?: No Communication: Independent Dressing (OT): Needs assistance Is this a change from baseline?: Pre-admission baseline Grooming: Independent Feeding: Independent Bathing: Needs assistance Is this a change from baseline?: Pre-admission baseline Toileting: Needs assistance Is this a change from baseline?: Pre-admission baseline In/Out Bed: Needs assistance Is this a change from baseline?: Pre-admission baseline Walks in Home: Needs assistance Is this a change from baseline?: Pre-admission baseline Does the patient have difficulty walking or climbing stairs?: Yes Weakness of Legs: Left Weakness of Arms/Hands: None  Permission Sought/Granted Permission sought to share information with : Case Manager Permission granted to share information with : Yes, Verbal Permission Granted     Permission granted to share info w AGENCY: Home Health and equipment agency        Emotional Assessment Appearance:: Appears stated age Attitude/Demeanor/Rapport: Gracious, Engaged Affect (typically observed): Pleasant, Appropriate Orientation: : Oriented to Self, Oriented to  Place, Oriented to  Time, Oriented to Situation Alcohol / Substance Use: Not Applicable Psych Involvement: No (comment)  Admission diagnosis:  Pulmonary embolism (HCC)  [I26.99] Other acute pulmonary embolism with acute cor pulmonale (HCC) [I26.09] Patient Active Problem List   Diagnosis Date Noted   Pulmonary embolism ([place]) 07/07/2021   Sinus tachycardia 07/07/2021   Primary osteoarthritis 07/07/2021   Popliteal DVT (deep venous thrombosis) ([place]) 07/07/2021   Acute deep vein thrombosis (DVT) of popliteal vein of left lower extremity ([place]) 07/07/2021   PCP:  [facility] [place] (Inactive) Pharmacy:   [place], [place] [facility] [address] [place] [address] Phone: [phone] Fax: [phone]     Social Determinants of Health (SDOH) Interventions    Readmission Risk Interventions No flowsheet data found.

## 2021-07-10 NOTE — Progress Notes (Signed)
PROGRESS NOTE    Sandra Phillips  ERX:[id number] DOB: [birth year] DOA: 07/07/2021 PCP: [facility] [place] (Inactive)    Brief Narrative:  70 y.o. female with medical history significant for primary osteoarthritis of the left knee, complex tear of the medial meniscus of the left knee status post left knee arthroscopy with debridement and repair of the posterior medial root tear and abrasion  chondoplasty who presents to the emergency department from home for chief concerns of shortness of breath.   She noticed swelling of her left foot on Tuesday and/or Wednesday (07/04/21).  She endorses compliance with aspirin 325 mg daily.  She states she she has not missed a single dose.  She reports that since the operation, she is mainly sitting in a chair and/or in bed laying down as movement is difficult for her.   She states that she noticed shortness of breath that started approximately 5:30 PM on 12/16 after an intense first physical therapy session. She she states that the shortness of breath improved with resting at home and she slept well on evening of 07/06/2021. Today, she noticed worsening dyspnea with exertion, worsening shortness of breath with rest, diaphoretic, and nausea and vomiting.  She only had Pedialyte today prior to presentation to the ED.   These acute symptoms prompted her to present to the emergency department for further evaluation.  CT angio chest significant for bilateral multifocal pulmonary emboli with CT evidence of right heart strain.  Left lower extremity significant for DVT.  Discussed with vascular surgery.  Plan for pulmonary thrombectomy 12/19  Patient underwent successful pulmonary thrombectomy on 12/19.  Tolerated procedure well.  No procedural complications.  The following morning the patient is on 3 L supplemental oxygen.  Otherwise stable   Assessment & Plan:   Principal Problem:   Pulmonary embolism ([place]) Active Problems:   Sinus  tachycardia   Primary osteoarthritis   Popliteal DVT (deep venous thrombosis) (HCC)   Acute deep vein thrombosis (DVT) of popliteal vein of left lower extremity (HCC)  Bilateral pulmonary embolism, submassive with right heart strain Acute lower extremity DVT and popliteal and posterior tibial veins Acute hypoxic respiratory failure secondary to above Presumably secondary to recent orthopedic intervention Orthopedic repair on 11/22 Patient was on aspirin 325 daily for 6 weeks as DVT prophylaxis Hemodynamically stable, on 2 L Status post mechanical thrombectomy 12/19 Plan: Currently on 3 L oxygen, wean as tolerated Continue heparin GTT for now, transition to Eliquis sometime within the next day or 2 Appreciate vascular surgery follow-up and further recommendations Continue telemetry monitoring  Sinus tachycardia Improved Presumed secondary to PE  Recent knee surgery No acute orthopedic issues Therapy evaluations    DVT prophylaxis: Heparin GTT Code Status: Full Family Communication: Spouse at bedside 12/20 Disposition Plan: Status is: Inpatient  Remains inpatient appropriate because: Acute PE on heparin gtt. status post pulmonary thrombectomy.  On 3 L oxygen.  We will attempt to wean oxygen prior to discharge       Level of care: Telemetry Medical  Consultants:  Vascular surgery Pulmonology   subjective: Seen and examined.  Resting comfortably in bed.  No visible distress.  Normal work of breathing.  No pain complaints  Objective: Vitals:   07/09/21 1915 07/10/21 0036 07/10/21 0521 07/10/21 0826  BP: 129/77 127/70 126/84 131/77  Pulse: 80 70 77 71  Resp: 18 16 16 18   Temp: 98.8 F (37.1 C) 98 F (36.7 C) 98.1 F (36.7 C) 97.9 F (36.6 C)  TempSrc:  Oral    SpO2: 99% 99% 100% 100%  Weight:      Height:        Intake/Output Summary (Last 24 hours) at 07/10/2021 1038 Last data filed at 07/10/2021 8413 Gross per 24 hour  Intake 0 ml  Output 1250 ml   Net -1250 ml   Filed Weights   07/07/21 1607 07/09/21 0600 07/09/21 1315  Weight: 99.8 kg 103.5 kg 99.8 kg    Examination:  General exam: No acute distress Respiratory system: Lungs clear.  Normal work of breathing.  3 L Cardiovascular system: S1-S2, RRR, no murmurs, no pedal edema Gastrointestinal system: Abdomen is nondistended, soft and nontender. No organomegaly or masses felt. Normal bowel sounds heard. Central nervous system: Alert and oriented. No focal neurological deficits. Extremities: Symmetric 5 x 5 power.  Left lower extremity slightly swollen Skin: No rashes, lesions or ulcers Psychiatry: Judgement and insight appear normal. Mood & affect appropriate.     Data Reviewed: I have personally reviewed following labs and imaging studies  CBC: Recent Labs  Lab 07/07/21 1615 07/08/21 0429 07/09/21 0353 07/10/21 0502  WBC 10.8* 9.5 7.4 8.8  NEUTROABS 8.6*  --   --   --   HGB 13.9 13.6 12.4 12.5  HCT 40.6 39.3 36.4 35.0*  MCV 94.4 93.6 92.6 90.7  PLT 185 173 148* 244   Basic Metabolic Panel: Recent Labs  Lab 07/07/21 1615 07/08/21 0429  NA 134* 140  K 4.0 4.2  CL 104 111  CO2 21* 22  GLUCOSE 173* 110*  BUN 17 12  CREATININE 0.86 0.74  CALCIUM 9.1 8.6*   GFR: Estimated Creatinine Clearance: 83.2 mL/min (by C-G formula based on SCr of 0.74 mg/dL). Liver Function Tests: Recent Labs  Lab 07/07/21 1615  AST 23  ALT 21  ALKPHOS 91  BILITOT 0.5  PROT 7.3  ALBUMIN 3.6   No results for input(s): LIPASE, AMYLASE in the last 168 hours. No results for input(s): AMMONIA in the last 168 hours. Coagulation Profile: Recent Labs  Lab 07/07/21 1615  INR 1.1   Cardiac Enzymes: No results for input(s): CKTOTAL, CKMB, CKMBINDEX, TROPONINI in the last 168 hours. BNP (last 3 results) No results for input(s): PROBNP in the last 8760 hours. HbA1C: No results for input(s): HGBA1C in the last 72 hours. CBG: Recent Labs  Lab 07/07/21 2329  GLUCAP 110*    Lipid Profile: No results for input(s): CHOL, HDL, LDLCALC, TRIG, CHOLHDL, LDLDIRECT in the last 72 hours. Thyroid Function Tests: No results for input(s): TSH, T4TOTAL, FREET4, T3FREE, THYROIDAB in the last 72 hours. Anemia Panel: No results for input(s): VITAMINB12, FOLATE, FERRITIN, TIBC, IRON, RETICCTPCT in the last 72 hours. Sepsis Labs: Recent Labs  Lab 07/07/21 1615 07/07/21 1820  LATICACIDVEN 3.1* 2.4*    Recent Results (from the past 240 hour(s))  Resp Panel by RT-PCR (Flu A&B, Covid) Nasopharyngeal Swab     Status: None   Collection Time: 07/07/21  4:15 PM   Specimen: Nasopharyngeal Swab; Nasopharyngeal(NP) swabs in vial transport medium  Result Value Ref Range Status   SARS Coronavirus 2 by RT PCR NEGATIVE NEGATIVE Final    Comment: (NOTE) SARS-CoV-2 target nucleic acids are NOT DETECTED.  The SARS-CoV-2 RNA is generally detectable in upper respiratory specimens during the acute phase of infection. The lowest concentration of SARS-CoV-2 viral copies this assay can detect is 138 copies/mL. A negative result does not preclude SARS-Cov-2 infection and should not be used as the sole basis for  treatment or other patient management decisions. A negative result may occur with  improper specimen collection/handling, submission of specimen other than nasopharyngeal swab, presence of viral mutation(s) within the areas targeted by this assay, and inadequate number of viral copies(<138 copies/mL). A negative result must be combined with clinical observations, patient history, and epidemiological information. The expected result is Negative.  Fact Sheet for Patients:  EntrepreneurPulse.com.au  Fact Sheet for Healthcare Providers:  IncredibleEmployment.be  This test is no t yet approved or cleared by the [place] FDA and  has been authorized for detection and/or diagnosis of SARS-CoV-2 by FDA under an Emergency Use Authorization  (EUA). This EUA will remain  in effect (meaning this test can be used) for the duration of the COVID-19 declaration under Section 564(b)(1) of the Act, 21 U.S.C.section 360bbb-3(b)(1), unless the authorization is terminated  or revoked sooner.       Influenza A by PCR NEGATIVE NEGATIVE Final   Influenza B by PCR NEGATIVE NEGATIVE Final    Comment: (NOTE) The Xpert Xpress SARS-CoV-2/FLU/RSV plus assay is intended as an aid in the diagnosis of influenza from Nasopharyngeal swab specimens and should not be used as a sole basis for treatment. Nasal washings and aspirates are unacceptable for Xpert Xpress SARS-CoV-2/FLU/RSV testing.  Fact Sheet for Patients: EntrepreneurPulse.com.au  Fact Sheet for Healthcare Providers: IncredibleEmployment.be  This test is not yet approved or cleared by the [place] FDA and has been authorized for detection and/or diagnosis of SARS-CoV-2 by FDA under an Emergency Use Authorization (EUA). This EUA will remain in effect (meaning this test can be used) for the duration of the COVID-19 declaration under Section 564(b)(1) of the Act, 21 U.S.C. section 360bbb-3(b)(1), unless the authorization is terminated or revoked.  Performed at [facility], [address] [place], [place] [address]   Blood Culture (routine x 2)     Status: None (Preliminary result)   Collection Time: 07/07/21  4:15 PM   Specimen: BLOOD  Result Value Ref Range Status   Specimen Description BLOOD BLOOD RIGHT ARM  Final   Special Requests   Final    BOTTLES DRAWN AEROBIC AND ANAEROBIC Blood Culture results may not be optimal due to an inadequate volume of blood received in culture bottles   Culture   Final    NO GROWTH 3 DAYS Performed at [facility], [address] [place], [place] [address]    Report Status PENDING  Incomplete  Blood Culture (routine x 2)     Status: None (Preliminary result)   Collection Time:  07/07/21  4:15 PM   Specimen: BLOOD  Result Value Ref Range Status   Specimen Description BLOOD BLOOD RIGHT WRIST  Final   Special Requests   Final    BOTTLES DRAWN AEROBIC AND ANAEROBIC Blood Culture adequate volume   Culture   Final    NO GROWTH 3 DAYS Performed at [facility], [address] [place], [place] [address]    Report Status PENDING  Incomplete         Radiology Studies: PERIPHERAL VASCULAR CATHETERIZATION  Result Date: 07/09/2021 See surgical note for result.       Scheduled Meds:   Continuous Infusions:  sodium chloride 75 mL/hr at 07/09/21 1737   heparin 1,250 Units/hr (07/10/21 0525)     LOS: 3 days    Time spent: 25 minutes    Sidney Ace, MD [facility]   If 7PM-7AM, please contact night-coverage  07/10/2021, 10:38 AM

## 2021-07-10 NOTE — Progress Notes (Signed)
Physical Therapy Treatment Patient Details Name: Sandra Phillips MRN: [id number] DOB: [birth year] Today's Date: 07/10/2021   History of Present Illness Sandra Phillips is a 70 y.o. female with medical history significant for primary osteoarthritis of the left knee, complex tear of the medial meniscus of the left knee status post left knee arthroscopy with debridement and repair of the posterior medial root tear and abrasion  chondoplasty who presents to the emergency department from home for chief concerns of shortness of breath. Pt s/p pulmonary thrombectomy and catherter placement in B lungs 07/09/21.    PT Comments    Per RN and pt, both more comfortable with PT to assist pt back to bed. Pt received in recliner with brace donned on L knee locked in extension. Still on RA with SPO2 > 90%. Pt required 5 min of time to finish urinating in pure wick prior to removal. Due to dizziness in eval, orthostatics assessed (see below). Pt endorses mild symptoms with dizziness with standing however BP is not positive for true orthostasis. Pt does endorse minor DOE with Standing to RW and declining mobility attempts.  Pt performed transfers with supervision to minguard and remains with good demo of LLE Wb'ing precautions. Pt returned to supine in bed with all needs and re-educated on LLE exercises for improved blood circulation and strengthening while maintaining brace donned and locked in extension. Will progress ambulation in following session before d/c home. D/c recs remain appropriate at this time as anticipate pt will not have safety concerns or difficulty ambulating household distances if dizziness subsides.   Orthostatic VS for the past 24 hrs:  BP- Sitting Pulse- Sitting BP- Standing at 0 minutes Pulse- Standing at 0 minutes  07/10/21 1455 112/63 79 122/77 102      Recommendations for follow up therapy are one component of a multi-disciplinary discharge planning process, led by the attending physician.   Recommendations may be updated based on patient status, additional functional criteria and insurance authorization.  Follow Up Recommendations  Home health PT     Assistance Recommended at Discharge Intermittent Supervision/Assistance  Equipment Recommendations  BSC/3in1    Recommendations for Other Services       Precautions / Restrictions Precautions Precautions: Knee Precaution Booklet Issued: No Precaution Comments: TDWB with knee in terminal extension with brace donned. Can flex to 90 deg block in brace in NWB position. Required Braces or Orthoses: Knee Immobilizer - Left Knee Immobilizer - Left: On at all times Restrictions Weight Bearing Restrictions: Yes LLE Weight Bearing: Touchdown weight bearing     Mobility  Bed Mobility Overal bed mobility: Modified Independent               Patient Response: Cooperative  Transfers Overall transfer level: Needs assistance Equipment used: Rolling walker (2 wheels) Transfers: Sit to/from Stand;Bed to chair/wheelchair/BSC Sit to Stand: Supervision     Step pivot transfers: Min guard     General transfer comment: cuing for hand placement, good use of TDWB on LLE with brace locked in extension, good RW sequencing    Ambulation/Gait               General Gait Details: Deferred due to minor dizziness and SOB   Stairs             Wheelchair Mobility    Modified Rankin (Stroke Patients Only)       Balance Overall balance assessment: Needs assistance Sitting-balance support: Bilateral upper extremity supported;Feet supported Sitting balance-Leahy Scale: Good  Standing balance-Leahy Scale: Fair Standing balance comment: Relies on UE support on RW                            Cognition Arousal/Alertness: Awake/alert Behavior During Therapy: WFL for tasks assessed/performed Overall Cognitive Status: Within Functional Limits for tasks assessed                                  General Comments: Very pleasant and cooperative        Exercises Other Exercises Other Exercises: Pt educated on quad sets, hip abduction, SLR, ankle pumps while knee brace donned and locked in extension    General Comments General comments (skin integrity, edema, etc.): SPO2 > 90% on RA at rest and with mobility. HR in lower 90's BPM      Pertinent Vitals/Pain Pain Assessment: No/denies pain    Home Living Family/patient expects to be discharged to:: Private residence Living Arrangements: Spouse/significant other Available Help at Discharge: Available PRN/intermittently Type of Home: House Home Access: Level entry       Home Layout: One level Home Equipment: Conservation officer, nature (2 wheels);Crutches;Transport chair      Prior Function            PT Goals (current goals can now be found in the care plan section) Acute Rehab PT Goals Patient Stated Goal: to go home PT Goal Formulation: With patient Time For Goal Achievement: 07/24/21 Potential to Achieve Goals: Good Progress towards PT goals: Progressing toward goals    Frequency    Min 2X/week      PT Plan Current plan remains appropriate    Co-evaluation              AM-PAC PT "6 Clicks" Mobility   Outcome Measure  Help needed turning from your back to your side while in a flat bed without using bedrails?: A Little Help needed moving from lying on your back to sitting on the side of a flat bed without using bedrails?: A Little Help needed moving to and from a bed to a chair (including a wheelchair)?: A Little Help needed standing up from a chair using your arms (e.g., wheelchair or bedside chair)?: A Little Help needed to walk in hospital room?: A Lot Help needed climbing 3-5 steps with a railing? : A Lot 6 Click Score: 16    End of Session Equipment Utilized During Treatment: Gait belt Activity Tolerance: Patient tolerated treatment well Patient left: in bed;with bed alarm set;with  family/visitor present Nurse Communication: Mobility status PT Visit Diagnosis: Other abnormalities of gait and mobility (R26.89);Muscle weakness (generalized) (M62.81)     Time: 0981-1914 PT Time Calculation (min) (ACUTE ONLY): 25 min  Charges:  $Therapeutic Activity: 8-22 mins                     Salem Caster. Fairly IV, PT, DPT Physical Therapist- [facility]  07/10/2021, 2:51 PM

## 2021-07-10 NOTE — Progress Notes (Signed)
°  Subjective: 1 Day Post-Op Procedure(s) (LRB): PULMONARY THROMBECTOMY (N/A) performed yesterday by vascular surgery. Patient is now four weeks s/p a left knee medial meniscus root tear. Patient reports pain as mild.   Patient is  well, reports she can breath much better after undergoing the thrombectomy yesterday. Plan is to go Home after hospital stay. Negative for chest pain and shortness of breath Fever: no Gastrointestinal:Negative for nausea and vomiting  Objective: Vital signs in last 24 hours: Temp:  [98 F (36.7 C)-98.8 F (37.1 C)] 98.1 F (36.7 C) (12/20 0521) Pulse Rate:  [70-85] 77 (12/20 0521) Resp:  [12-23] 16 (12/20 0521) BP: (107-156)/(70-98) 126/84 (12/20 0521) SpO2:  [90 %-100 %] 100 % (12/20 0521) Weight:  [99.8 kg] 99.8 kg (12/19 1315)  Intake/Output from previous day:  Intake/Output Summary (Last 24 hours) at 07/10/2021 0807 Last data filed at 07/10/2021 0600 Gross per 24 hour  Intake 0 ml  Output 1000 ml  Net -1000 ml    Intake/Output this shift: No intake/output data recorded.  Labs: Recent Labs    07/07/21 1615 07/08/21 0429 07/09/21 0353 07/10/21 0502  HGB 13.9 13.6 12.4 12.5   Recent Labs    07/09/21 0353 07/10/21 0502  WBC 7.4 8.8  RBC 3.93 3.86*  HCT 36.4 35.0*  PLT 148* 177   Recent Labs    07/07/21 1615 07/08/21 0429  NA 134* 140  K 4.0 4.2  CL 104 111  CO2 21* 22  BUN 17 12  CREATININE 0.86 0.74  GLUCOSE 173* 110*  CALCIUM 9.1 8.6*   Recent Labs    07/07/21 1615  INR 1.1   EXAM General - Patient is Alert, Appropriate, and Oriented Extremity - Knee brace is locked in extension. Mild effusion without erythema, purulent drainage or any signs of infection. Steri-strips are intact to the left knee, no bloody drainage is noted. Motor Function - intact, moving foot and toes well on exam.   Past Medical History:  Diagnosis Date   Primary osteoarthritis     Assessment/Plan: 1 Day Post-Op Procedure(s)  (LRB): PULMONARY THROMBECTOMY (N/A) Principal Problem:   Pulmonary embolism (HCC) Active Problems:   Sinus tachycardia   Primary osteoarthritis   Popliteal DVT (deep venous thrombosis) (HCC)   Acute deep vein thrombosis (DVT) of popliteal vein of left lower extremity (HCC)  Estimated body mass index is 33.45 kg/m as calculated from the following:   Height as of this encounter: 5\' 8"  (1.727 m).   Weight as of this encounter: 99.8 kg. Advance diet Up with therapy  No signs of infection to the left knee. Patient is currently on heparin drip. Now that the clot burden has decreased, will place order for PT eval and treat. Given recent PE status, patient may benefit from in home therapy.  Will place order for this based on how she does with therapy. Will need to continue toe-touch to the left leg, when non-ambulatory can unlock the knee brace and work on flexion up to 90 degrees.  DVT Prophylaxis -  heparin Toe-touch weightbearing to the left leg.  Raquel Amaryllis Malmquist, PA-C [facility] Orthopaedic Surgery 07/10/2021, 8:07 AM

## 2021-07-10 NOTE — Evaluation (Signed)
Physical Therapy Evaluation Patient Details Name: Sandra Phillips MRN: [id number] DOB: 1950/11/15 Today's Date: 07/10/2021  History of Present Illness  Sandra Phillips is a 70 y.o. female with medical history significant for primary osteoarthritis of the left knee, complex tear of the medial meniscus of the left knee status post left knee arthroscopy with debridement and repair of the posterior medial root tear and abrasion  chondoplasty who presents to the emergency department from home for chief concerns of shortness of breath. Pt s/p pulmonary thrombectomy and catherter placement in B lungs 07/09/21.   Clinical Impression  Pt admitted with above diagnosis. Pt received upright in bed agreeable to PT. Resting on 3L with SPO2 at 98-100%. Titrated to RA during subjective reports. SPO2 remained >95% throughout pt denying SOB, dizziness. Reports recent PLOF (s/p L meniscus repair 4 weeks ago), home lay out, DME, assist available. Pt is currently household ambulator with axillary crutches receiving ADL assist for LLE dressing but is otherwise independent. Reports taking sponge bath/ using wipes for bathing due to being unsure how she is supposed to maintain LLE precautions in shower. PT reported will place OT order for pt. Prior to mobility L knee brace tightened and ensured to be locked in extension. Pt mod-I with bed mobility with HOB elevated and extra time denying dizziness. IV site bleeding so 5-10 minutes spent on RN attending IV site. During this pt educated on keeping knee flexion locked to 90 deg in NWB position and how to unlock to self manage knee brace within orthopedic orders. Pt verbalizing understanding. X2 STS performed with min VC"s for hand placement to RW. Initially pt reports significant dizziness after 20-30 sec requesting to have a seat. SPO2 on RA remains > 95% but placed on 2L/min as pt endorses mild DOE with standing. Improves after 2 min of seated rest and performed again. Pt reports  improvement in symptoms but is still slightly dizzy. Able to step pivot transfer maintaining LLE Wb'ing precautions and sat safely in recliner. No dynamap present in room to assess orthostatics but anticipate orthostatics due to BP being soft per EMR since Spo2 > 95% througout with HR WNL as well (mid 80's). Pt placed on RA as SPO2 remaining >95%. RN notified. Anticipate once pt able to amb household distance pt will be safe to d/c to home environment with [facility] PT. Pt educated on importance of safe use of DME to prevent falls especially being on anti-coagulation medication thus another reason limiting OOB activity due to symptoms. Will perform f/u session before d/c to progress mobility. Pt currently with functional limitations due to the deficits listed below (see PT Problem List). Pt will benefit from skilled PT to increase their independence and safety with mobility to allow discharge to the venue listed below.     Recommendations for follow up therapy are one component of a multi-disciplinary discharge planning process, led by the attending physician.  Recommendations may be updated based on patient status, additional functional criteria and insurance authorization.  Follow Up Recommendations Home health PT    Assistance Recommended at Discharge Intermittent Supervision/Assistance  Functional Status Assessment Patient has had a recent decline in their functional status and demonstrates the ability to make significant improvements in function in a reasonable and predictable amount of time.  Equipment Recommendations  BSC/3in1    Recommendations for Other Services       Precautions / Restrictions Precautions Precautions: Knee Precaution Booklet Issued: No Precaution Comments: TDWB with knee in terminal extension with brace  donned. Can flex to 90 deg block in brace in NWB position. Required Braces or Orthoses: Knee Immobilizer - Left Knee Immobilizer - Left: On at all times Restrictions Weight  Bearing Restrictions: Yes LLE Weight Bearing: Touchdown weight bearing      Mobility  Bed Mobility Overal bed mobility: Modified Independent               Patient Response: Cooperative  Transfers Overall transfer level: Needs assistance Equipment used: Rolling walker (2 wheels) Transfers: Sit to/from Stand;Bed to chair/wheelchair/BSC Sit to Stand: Min guard   Step pivot transfers: Min guard       General transfer comment: cuing for hand placement, good use of TDWB on LLE with brace locked in extension    Ambulation/Gait               General Gait Details: deferred due to dizziness  Stairs            Wheelchair Mobility    Modified Rankin (Stroke Patients Only)       Balance Overall balance assessment: Needs assistance Sitting-balance support: Bilateral upper extremity supported;Feet supported Sitting balance-Leahy Scale: Good       Standing balance-Leahy Scale: Fair Standing balance comment: Relies on UE support on RW                             Pertinent Vitals/Pain Pain Assessment: No/denies pain    Home Living Family/patient expects to be discharged to:: Private residence Living Arrangements: Spouse/significant other Available Help at Discharge: Available PRN/intermittently Type of Home: House Home Access: Level entry       Home Layout: One level Home Equipment: Conservation officer, nature (2 wheels);Crutches;Transport chair      Prior Function Prior Level of Function : Independent/Modified Independent;Needs assist       Physical Assist : Mobility (physical)     Mobility Comments: Sleeping in chair in living room. Use of crutches household distances ADLs Comments: partner helps with donning/doffig socks on L foot     Hand Dominance        Extremity/Trunk Assessment   Upper Extremity Assessment Upper Extremity Assessment: Overall WFL for tasks assessed    Lower Extremity Assessment Lower Extremity Assessment:  Generalized weakness;LLE deficits/detail LLE Deficits / Details: Recent L meniscus repair    Cervical / Trunk Assessment Cervical / Trunk Assessment: Normal  Communication   Communication: No difficulties  Cognition Arousal/Alertness: Awake/alert Behavior During Therapy: WFL for tasks assessed/performed Overall Cognitive Status: Within Functional Limits for tasks assessed                                 General Comments: Very pleasant and cooperative        General Comments General comments (skin integrity, edema, etc.): SPO2 > 95% on RA throughout activity    Exercises Other Exercises Other Exercises: ROle of PT in acute setting, d/c recs, DME recs, safe use of DME, L knee precautions, brace management do's/dont's per orthopedic orders   Assessment/Plan    PT Assessment Patient needs continued PT services  PT Problem List Decreased strength;Decreased range of motion;Decreased activity tolerance;Decreased balance;Decreased safety awareness;Decreased mobility       PT Treatment Interventions DME instruction;Therapeutic exercise;Gait training;Balance training;Stair training;Neuromuscular re-education;Functional mobility training;Therapeutic activities;Patient/family education    PT Goals (Current goals can be found in the Care Plan section)  Acute Rehab PT Goals Patient Stated  Goal: to go home PT Goal Formulation: With patient Time For Goal Achievement: 07/24/21 Potential to Achieve Goals: Good    Frequency Min 2X/week   Barriers to discharge        Co-evaluation               AM-PAC PT "6 Clicks" Mobility  Outcome Measure Help needed turning from your back to your side while in a flat bed without using bedrails?: A Little Help needed moving from lying on your back to sitting on the side of a flat bed without using bedrails?: A Little Help needed moving to and from a bed to a chair (including a wheelchair)?: A Little Help needed standing up from  a chair using your arms (e.g., wheelchair or bedside chair)?: A Little Help needed to walk in hospital room?: A Little Help needed climbing 3-5 steps with a railing? : A Lot 6 Click Score: 17    End of Session Equipment Utilized During Treatment: Gait belt;Oxygen Activity Tolerance: Patient tolerated treatment well Patient left: in chair;with call bell/phone within reach Nurse Communication: Mobility status PT Visit Diagnosis: Other abnormalities of gait and mobility (R26.89);Muscle weakness (generalized) (M62.81)    Time: 6270-3500 PT Time Calculation (min) (ACUTE ONLY): 45 min   Charges:   PT Evaluation $PT Eval Moderate Complexity: 1 Mod PT Treatments $Therapeutic Activity: 8-22 mins       Alyiah Ulloa M. Fairly IV, PT, DPT Physical Therapist- [facility]  07/10/2021, 12:51 PM

## 2021-07-10 NOTE — Progress Notes (Signed)
[facility]  Daily Progress Note   Subjective  - 1 Day Post-Op  Patient is sitting up in bed upon entering the room  She states that she can breathe so much easier and feels so much better she has been weaned off her oxygen completely and is quite comfortable.  She denies any groin related symptoms.  Objective Vitals:   07/10/21 0521 07/10/21 0826 07/10/21 1104 07/10/21 1715  BP: 126/84 131/77 105/60 107/63  Pulse: 77 71 79 82  Resp: 16 18 16 20   Temp: 98.1 F (36.7 C) 97.9 F (36.6 C) 98.1 F (36.7 C) 98.4 F (36.9 C)  TempSrc:   Oral Oral  SpO2: 100% 100% 100% 98%  Weight:      Height:        Intake/Output Summary (Last 24 hours) at 07/10/2021 1941 Last data filed at 07/10/2021 1517 Gross per 24 hour  Intake 898.79 ml  Output 1000 ml  Net -101.21 ml    PULM  Normal effort , no use of accessory muscles CV  No JVD, RRR Abd      No distended, nontender VASC  right groin still has a safeguard in place.  No evidence of hematoma or significant ecchymoses.  Laboratory CBC    Component Value Date/Time   WBC 8.8 07/10/2021 0502   HGB 12.5 07/10/2021 0502   HGB 14.1 09/23/2012 0735   HCT 35.0 (L) 07/10/2021 0502   HCT 42.5 09/23/2012 0735   PLT 177 07/10/2021 0502   PLT 202 09/23/2012 0735    BMET    Component Value Date/Time   NA 140 07/08/2021 0429   NA 141 09/23/2012 0735   K 4.2 07/08/2021 0429   K 3.9 09/23/2012 0735   CL 111 07/08/2021 0429   CL 106 09/23/2012 0735   CO2 22 07/08/2021 0429   CO2 25 09/23/2012 0735   GLUCOSE 110 (H) 07/08/2021 0429   GLUCOSE 142 (H) 09/23/2012 0735   BUN 12 07/08/2021 0429   BUN 11 09/23/2012 0735   CREATININE 0.74 07/08/2021 0429   CREATININE 0.98 09/23/2012 0735   CALCIUM 8.6 (L) 07/08/2021 0429   CALCIUM 9.0 09/23/2012 0735   GFRNONAA >60 07/08/2021 0429   GFRNONAA >60 09/23/2012 0735   GFRAA >60 09/23/2012 0735    Assessment/Planning: POD #1 s/p pulmonary thrombectomy  Pulmonary  embolus: Patient is currently on heparin she has done quite well and her respiratory status is markedly improved.  We will transition to oral anticoagulates tomorrow morning and pending her physical therapy assessment hope for discharge.  Safeguard will be removed.  She will follow-up in the office in 2 to 3 weeks.  2.  Left lower extremity DVT: Patient is anticoagulated and this will treat this issue as well.  Mobility, ambulation and weightbearing status per Ortho.  Once she has had 72 hours of anticoagulation graduated compression within knee-high 15 to 20 mmHg compression stocking is recommended   Hortencia Pilar  07/10/2021, 7:41 PM

## 2021-07-11 DIAGNOSIS — I2609 Other pulmonary embolism with acute cor pulmonale: Secondary | ICD-10-CM

## 2021-07-11 LAB — CBC
HCT: 32.7 % — ABNORMAL LOW (ref 36.0–46.0)
Hemoglobin: 11.3 g/dL — ABNORMAL LOW (ref 12.0–15.0)
MCH: 31.7 pg (ref 26.0–34.0)
MCHC: 34.6 g/dL (ref 30.0–36.0)
MCV: 91.6 fL (ref 80.0–100.0)
Platelets: 147 10*3/uL — ABNORMAL LOW (ref 150–400)
RBC: 3.57 MIL/uL — ABNORMAL LOW (ref 3.87–5.11)
RDW: 13 % (ref 11.5–15.5)
WBC: 7 10*3/uL (ref 4.0–10.5)
nRBC: 0 % (ref 0.0–0.2)

## 2021-07-11 LAB — HEPARIN LEVEL (UNFRACTIONATED): Heparin Unfractionated: 0.51 IU/mL (ref 0.30–0.70)

## 2021-07-11 MED ORDER — APIXABAN 5 MG PO TABS: 5.0000 mg | ORAL_TABLET | Freq: Two times a day (BID) | ORAL | Status: DC

## 2021-07-11 MED ORDER — APIXABAN 5 MG PO TABS: 10.0000 mg | ORAL_TABLET | Freq: Two times a day (BID) | ORAL | 0 refills | Status: DC

## 2021-07-11 MED ORDER — APIXABAN 5 MG PO TABS
10.0000 mg | ORAL_TABLET | Freq: Two times a day (BID) | ORAL | Status: DC
  Administered 2021-07-11: 11:00:00 10 mg via ORAL
  Filled 2021-07-11: qty 2

## 2021-07-11 MED ORDER — APIXABAN 5 MG PO TABS: 5.0000 mg | ORAL_TABLET | Freq: Two times a day (BID) | ORAL | 0 refills | Status: DC

## 2021-07-11 NOTE — Progress Notes (Signed)
PULMONOLOGY         Date: 07/11/2021,   MRN# [id number] Sandra Phillips October 08, 1950     AdmissionWeight: 99.8 kg                 CurrentWeight: 102.9 kg   Referring physician: Dr Priscella Echeverria   CHIEF COMPLAINT:   Bilateral pulmonary venous thromboembolism   HISTORY OF PRESENT ILLNESS   This is a very pleasant 70 yo F with hx of OA who came in acutely sob and was found to have tachycardia.  Chest imaging was done and found to have bilateral PE.  She was not unstable and vital signs remained normal with o2 requirement of 2L/min supplemental oxygen via nasal canula.  She had cardiac biomarkers done with mild elevation and CT evidence of component of Right heart strain.  I discussed case with ED doctor and [facility] hospitalisit and we opted to not use tPA due to clinical stability.  I reviewed imaging with patient in detail We discussed options and agree on having vascular surgery evaluation due to high clot burden evidenced by CT chest.    Patient had sucessful thrombectomy with subsequent improvement.  She may folllow up with [name] at [facility] pulmonology and is able to dc home from [place]   Past Medical History:  Diagnosis Date   Primary osteoarthritis      SURGICAL HISTORY   Past Surgical History:  Procedure Laterality Date   KNEE ARTHROSCOPY Left 06/12/2021   Procedure: LEFT KNEE ARTHROSCOPY WITH DEBRIDEMENT AND REPAIR OF A POSTERIOR MEDIAL ROOT TEAR AND ABRASION [place];  Surgeon: Corky Mull, MD;  Location: [facility] ORS;  Service: Orthopedics;  Laterality: Left;   PULMONARY THROMBECTOMY N/A 07/09/2021   Procedure: PULMONARY THROMBECTOMY;  Surgeon: Algernon Huxley, MD;  Location: [facility];  Service: Cardiovascular;  Laterality: N/A;     FAMILY HISTORY   Family History  Problem Relation Age of Onset   Cancer Mother    Rheum arthritis Sister      SOCIAL HISTORY   Social History   Tobacco Use   Smoking status: Never    Smokeless tobacco: Never  Substance Use Topics   Alcohol use: Yes    Alcohol/week: 1.0 standard drink    Types: 1 Glasses of wine per week    Comment: a glass at dinner   Drug use: Never     MEDICATIONS    Home Medication:    Current Medication:  Current Facility-Administered Medications:    0.9 %  sodium chloride infusion, , Intravenous, Continuous, [name], [name], MD, Last Rate: 75 mL/hr at 07/09/21 1737, New Bag at 07/09/21 1737   apixaban (ELIQUIS) tablet 10 mg, 10 mg, Oral, BID, 10 mg at 07/11/21 1035 **FOLLOWED BY** [START ON 07/18/2021] apixaban (ELIQUIS) tablet 5 mg, 5 mg, Oral, BID, [name], [name], MD   HYDROmorphone (DILAUDID) injection 1 mg, 1 mg, Intravenous, Once PRN, [name], [name], MD   lip balm (BLISTEX) ointment, , Topical, PRN, Lucky Cowboy, [name], MD   ondansetron (ZOFRAN) tablet 4 mg, 4 mg, Oral, Q6H PRN **OR** ondansetron (ZOFRAN) injection 4 mg, 4 mg, Intravenous, Q6H PRN, [name], [name], MD   ondansetron (ZOFRAN) injection 4 mg, 4 mg, Intravenous, Q6H PRN, Lucky Cowboy, [name], MD   traMADol (ULTRAM) tablet 50 mg, 50 mg, Oral, Q6H PRN, [name], [name], MD    ALLERGIES   Penicillins and Shrimp flavor     REVIEW OF SYSTEMS  Review of Systems:  Gen:  Denies  fever, sweats, chills weigh loss  HEENT: Denies blurred vision, double vision, ear pain, eye pain, hearing loss, nose bleeds, sore throat Cardiac:  No dizziness, chest pain or heaviness, chest tightness,edema Resp:   Denies cough or sputum porduction, shortness of breath,wheezing, hemoptysis,  Gi: Denies swallowing difficulty, stomach pain, nausea or vomiting, diarrhea, constipation, bowel incontinence Gu:  Denies bladder incontinence, burning urine Ext:   Denies Joint pain, stiffness or swelling Skin: Denies  skin rash, easy bruising or bleeding or hives Endoc:  Denies polyuria, polydipsia , polyphagia or weight change Psych:   Denies depression, insomnia or hallucinations   Other:  All other systems  negative   VS: BP 123/80 (BP Location: Right Arm)    Pulse 86    Temp 99 F (37.2 C) (Oral)    Resp 18    Ht 5\' 8"  (1.727 m)    Wt 102.9 kg    SpO2 99%    BMI 34.49 kg/m      PHYSICAL EXAM    GENERAL:NAD, no fevers, chills, no weakness no fatigue HEAD: Normocephalic, atraumatic.  EYES: Pupils equal, round, reactive to light. Extraocular muscles intact. No scleral icterus.  MOUTH: Moist mucosal membrane. Dentition intact. No abscess noted.  EAR, NOSE, THROAT: Clear without exudates. No external lesions.  NECK: Supple. No thyromegaly. No nodules. No JVD.  PULMONARY:clear to auscultation bilaterally  CARDIOVASCULAR: S1 and S2. Regular rate and rhythm. No murmurs, rubs, or gallops. No edema. Pedal pulses 2+ bilaterally.  GASTROINTESTINAL: Soft, nontender, nondistended. No masses. Positive bowel sounds. No hepatosplenomegaly.  MUSCULOSKELETAL: No swelling, clubbing, or edema. Range of motion full in all extremities.  NEUROLOGIC: Cranial nerves II through XII are intact. No gross focal neurological deficits. Sensation intact. Reflexes intact.  SKIN: No ulceration, lesions, rashes, or cyanosis. Skin warm and dry. Turgor intact.  PSYCHIATRIC: Mood, affect within normal limits. The patient is awake, alert and oriented x 3. Insight, judgment intact.       IMAGING    CT Angio Chest PE W and/or Wo Contrast  Result Date: 07/07/2021 CLINICAL DATA:  Pulmonary embolism (PE) suspected, high prob EXAM: CT ANGIOGRAPHY CHEST WITH CONTRAST TECHNIQUE: Multidetector CT imaging of the chest was performed using the standard protocol during bolus administration of intravenous contrast. Multiplanar CT image reconstructions and MIPs were obtained to evaluate the vascular anatomy. CONTRAST:  45mL OMNIPAQUE IOHEXOL 350 MG/ML SOLN COMPARISON:  None. FINDINGS: Cardiovascular: Positive for pulmonary emboli. There is a large clot burden within the right and left branch pulmonary arteries, extending into the  bilateral lobar, segmental, and subsegmental branches of each lobe. The right atrium and right ventricle are dilated with RV-LV ratio measuring 1.6, consistent with right heart strain. The thoracic aorta demonstrates mild atherosclerotic calcifications. No pericardial disease. Mediastinum/Nodes: No mediastinal, hilar, or axillary lymphadenopathy. Thyroid is unremarkable. Tiny hiatal hernia. Lungs/Pleura: No focal airspace consolidation. Bibasilar hypoventilatory changes. No suspicious pulmonary nodules or masses. No pleural effusion. No pneumothorax. Upper Abdomen: Tiny left hepatic cyst.  No acute findings. Musculoskeletal: No acute osseous abnormality. No suspicious lytic or blastic lesions. Review of the MIP images confirms the above findings. IMPRESSION: Acute pulmonary emboli with extensive clot burden bilaterally as described above, and CT evidence of right heart strain. Positive for acute PE with CT evidence of right heart strain (RV/LV Ratio = 1.6) consistent with at least submassive (intermediate risk) PE. The presence of right heart strain has been associated with an increased risk of morbidity and  mortality. Please refer to the "PE Focused" order set in EPIC. Critical Value/emergent results were called by telephone at the time of interpretation on 07/07/2021 at 5:40 pm to provider Merlyn Lot , who verbally acknowledged these results. Electronically Signed   By: Maurine Simmering M.D.   On: 07/07/2021 17:46   PERIPHERAL VASCULAR CATHETERIZATION  Result Date: 07/09/2021 See surgical note for result.  US Venous Img Lower Unilateral Left  Result Date: 07/07/2021 CLINICAL DATA:  Left lower extremity pain and swelling, history of recent knee surgery, initial encounter EXAM: LEFT LOWER EXTREMITY VENOUS DOPPLER ULTRASOUND TECHNIQUE: Gray-scale sonography with graded compression, as well as color Doppler and duplex ultrasound were performed to evaluate the lower extremity deep venous systems from the  level of the common femoral vein and including the common femoral, femoral, profunda femoral, popliteal and calf veins including the posterior tibial, peroneal and gastrocnemius veins when visible. The superficial great saphenous vein was also interrogated. Spectral Doppler was utilized to evaluate flow at rest and with distal augmentation maneuvers in the common femoral, femoral and popliteal veins. COMPARISON:  None. FINDINGS: Contralateral Common Femoral Vein: Respiratory phasicity is normal and symmetric with the symptomatic side. No evidence of thrombus. Normal compressibility. Common Femoral Vein: No evidence of thrombus. Normal compressibility, respiratory phasicity and response to augmentation. Saphenofemoral Junction: No evidence of thrombus. Normal compressibility and flow on color Doppler imaging. Profunda Femoral Vein: No evidence of thrombus. Normal compressibility and flow on color Doppler imaging. Femoral Vein: No evidence of thrombus. Normal compressibility, respiratory phasicity and response to augmentation. Popliteal Vein: Thrombus is noted with decreased compressibility. Calf Veins: Thrombus is noted within the posterior tibial vein. The peroneal vein is not well visualized. Superficial Great Saphenous Vein: No evidence of thrombus. Normal compressibility. Venous Reflux:  None. Other Findings:  None. IMPRESSION: Thrombus is noted within the popliteal and posterior tibial veins. Electronically Signed   By: Inez Catalina M.D.   On: 07/07/2021 18:27   DG Chest Port 1 View  Result Date: 07/07/2021 CLINICAL DATA:  Clinical concern for sepsis. Four weeks status post left knee meniscal surgery. EXAM: PORTABLE CHEST 1 VIEW COMPARISON:  None. FINDINGS: Cardiac silhouette normal in size.  No mediastinal or hilar masses. Clear lungs.  No pleural effusion or pneumothorax. Skeletal structures are grossly intact. IMPRESSION: No active disease. Electronically Signed   By: Lajean Manes M.D.   On: 07/07/2021  16:41   ECHOCARDIOGRAM COMPLETE  Result Date: 07/08/2021    ECHOCARDIOGRAM REPORT   Patient Name:   Sandra Phillips Date of Exam: 07/08/2021 Medical Rec #:  [id number]    Height:       68.0 in Accession #:    [id number]   Weight:       220.0 lb Date of Birth:  12/03/1950    BSA:          2.128 m Patient Age:    40 years     BP:           148/87 mmHg Patient Gender: F            HR:           90 bpm. Exam Location:  [facility] Procedure: 2D Echo and Intracardiac Opacification Agent Indications:     Pulmonary Embolus I26.09  History:         Patient has no prior history of Echocardiogram examinations.  Sonographer:     Kathlen Brunswick RDCS Referring Phys:  [id number] AMY N COX Diagnosing Phys: Darnell Level  Nehemiah Massed MD  Sonographer Comments: Technically difficult study due to poor echo windows. IMPRESSIONS  1. Left ventricular ejection fraction, by estimation, is 60 to 65%. The left ventricle has normal function. The left ventricle has no regional wall motion abnormalities. Left ventricular diastolic parameters were normal.  2. Right ventricular systolic function is mildly reduced. The right ventricular size is severely enlarged. Mildly increased right ventricular wall thickness. There is severely elevated pulmonary artery systolic pressure.  3. Right atrial size was moderately dilated.  4. The mitral valve is normal in structure. Trivial mitral valve regurgitation.  5. Tricuspid valve regurgitation is moderate to severe.  6. The aortic valve is normal in structure. Aortic valve regurgitation is not visualized. FINDINGS  Left Ventricle: Left ventricular ejection fraction, by estimation, is 60 to 65%. The left ventricle has normal function. The left ventricle has no regional wall motion abnormalities. Definity contrast agent was given IV to delineate the left ventricular  endocardial borders. The left ventricular internal cavity size was small. There is no left ventricular hypertrophy. Left ventricular diastolic parameters were  normal. Right Ventricle: The right ventricular size is severely enlarged. Mildly increased right ventricular wall thickness. Right ventricular systolic function is mildly reduced. There is severely elevated pulmonary artery systolic pressure. Left Atrium: Left atrial size was normal in size. Right Atrium: Right atrial size was moderately dilated. Pericardium: There is no evidence of pericardial effusion. Mitral Valve: The mitral valve is normal in structure. Trivial mitral valve regurgitation. Tricuspid Valve: The tricuspid valve is normal in structure. Tricuspid valve regurgitation is moderate to severe. Aortic Valve: The aortic valve is normal in structure. Aortic valve regurgitation is not visualized. Aortic valve peak gradient measures 4.5 mmHg. Pulmonic Valve: The pulmonic valve was normal in structure. Pulmonic valve regurgitation is mild. Aorta: The aortic root and ascending aorta are structurally normal, with no evidence of dilitation. IAS/Shunts: No atrial level shunt detected by color flow Doppler.  LEFT VENTRICLE PLAX 2D LVIDd:         4.00 cm Diastology LVIDs:         2.80 cm LV e' medial:    6.20 cm/s LV PW:         1.20 cm LV E/e' medial:  8.0 LV IVS:        1.20 cm LV e' lateral:   5.98 cm/s                        LV E/e' lateral: 8.3  RIGHT VENTRICLE RV Basal diam:  5.00 cm RV S prime:     7.94 cm/s TAPSE (M-mode): 1.1 cm LEFT ATRIUM           Index        RIGHT ATRIUM           Index LA diam:      2.80 cm 1.32 cm/m   RA Area:     24.50 cm LA Vol (A4C): 45.1 ml 21.19 ml/m  RA Volume:   91.20 ml  42.85 ml/m  AORTIC VALVE AV Vmax:      106.00 cm/s AV Peak Grad: 4.5 mmHg LVOT Vmax:    75.10 cm/s LVOT Vmean:   49.100 cm/s LVOT VTI:     0.129 m MITRAL VALVE               TRICUSPID VALVE MV Area (PHT): 8.34 cm    TV Peak grad:   36.2 mmHg MV Decel Time: 91 msec  TV Vmax:        3.01 m/s MV E velocity: 49.70 cm/s MV A velocity: 39.00 cm/s  SHUNTS MV E/A ratio:  1.27        Systemic VTI: 0.13 m Serafina Royals MD Electronically signed by Serafina Royals MD Signature Date/Time: 07/08/2021/3:20:41 PM    Final       ASSESSMENT/PLAN   Bilateral PE and DVT   - patient is stable but with extensive thrombosis bilaterally    - vital signs remain adequate and with mild hypoxemia   - Simlified PESI - 8.9% death risk    -recommendation for vascular surgery evaluation for possible IVC filter and thrombectomy   -reviewed imaging with patient in detail , patient wishes to have surgical intervention to help remove blood clots S/p thrombectomy   Thank you for allowing me to participate in the care of this patient.   Patient/Family are satisfied with care plan and all questions have been answered.  This document was prepared using Dragon voice recognition software and may include unintentional dictation errors.     Ottie Glazier, M.D.  Division of [facility] [place]

## 2021-07-11 NOTE — Progress Notes (Signed)
ANTICOAGULATION CONSULT NOTE  Pharmacy Consult for heparin infusion Indication: pulmonary embolus  Allergies  Allergen Reactions   Penicillins     Unknown childhood reaction    Shrimp Flavor Swelling    Patient Measurements: Height: 5\' 8"  (172.7 cm) Weight: 102.9 kg (226 lb 13.7 oz) IBW/kg (Calculated) : 63.9 Heparin Dosing Weight: 86.5 kg  Vital Signs: Temp: 98.4 F (36.9 C) (12/21 0445) Temp Source: Oral (12/21 0445) BP: 120/78 (12/21 0445) Pulse Rate: 80 (12/21 0445)  Labs: Recent Labs    07/08/21 0946 07/08/21 1606 07/09/21 0353 07/10/21 0502 07/11/21 0604  HGB  --    < > 12.4 12.5 11.3*  HCT  --   --  36.4 35.0* 32.7*  PLT  --   --  148* 177 147*  HEPARINUNFRC 0.55   < > 0.48 0.68 0.51  TROPONINIHS 278*  --   --   --   --    < > = values in this interval not displayed.     Estimated Creatinine Clearance: 84.5 mL/min (by C-G formula based on SCr of 0.74 mg/dL).   Medical History: Past Medical History:  Diagnosis Date   Primary osteoarthritis     Medications:  Per chart review, no anticoagulation noted prior to admission  Assessment: 70 y.o. female with history of recent knee surgery presents the ER for shortness of breath. Pharmacy has been consulted for heparin dosing/monitoring. Baseline CBC WNL.   12/18 0946 HL 0.55  12/18 1606 HL 0.46 12/19 0353 HL 0.48 12/20 0502 HL 0.68 12/21 0604 HL 0.51  Goal of Therapy:  Heparin level 0.3-0.7 units/ml Monitor platelets by anticoagulation protocol: Yes   Plan:  Heparin level is therapeutic. Will continue heparin infusion at 1250 units/hr. Recheck heparin level with AM labs and CBC daily while on heparin.   Sherilyn Banker, PharmD Clinical Pharmacist  07/11/2021 6:40 AM

## 2021-07-11 NOTE — Progress Notes (Signed)
Physical Therapy Treatment Patient Details Name: Sandra Phillips MRN: [id number] DOB: 1950/12/28 Today's Date: 07/11/2021   History of Present Illness ESTEFANIE CORNFORTH is a 70 y.o. female with medical history significant for primary osteoarthritis of the left knee, complex tear of the medial meniscus of the left knee status post left knee arthroscopy with debridement and repair of the posterior medial root tear and abrasion  chondoplasty who presents to the emergency department from home for chief concerns of shortness of breath. Pt s/p pulmonary thrombectomy and catherter placement in B lungs 07/09/21.    PT Comments    Pt received in recliner with RN. Agreeable to PT. Pt needed brief re-education on how to lock/unlock knee brace and how to ensure brace locked in extension prior to mobility.  Able to stand to RW with good hand placement and amb maintaining precautions ~44' total. Did initially require seated rest after 12' or so but after 2-3 min able to amb 33' back to recliner. Pt educated on energy conservation techniques to ensure safe household mobility with pt verbalizing understanding. Able to demo indep locking/unlocking of brace at end of session in recliner. Pt verbalizing all education/instructions with no further questions or concerns. Safe to d/c to home environment with [facility] PT for further progression of mobility to pt tolerance and per tissue healing guidelines set forth by orthopedics.    Recommendations for follow up therapy are one component of a multi-disciplinary discharge planning process, led by the attending physician.  Recommendations may be updated based on patient status, additional functional criteria and insurance authorization.  Follow Up Recommendations  Home health PT     Assistance Recommended at Discharge Intermittent Supervision/Assistance  Equipment Recommendations  BSC/3in1    Recommendations for Other Services       Precautions / Restrictions  Precautions Precautions: Knee Precaution Booklet Issued: No Precaution Comments: TDWB with knee in terminal extension with brace donned. Can flex to 90 deg block in brace in NWB position. Required Braces or Orthoses: Knee Immobilizer - Left Knee Immobilizer - Left: On at all times;Other (comment) Restrictions Weight Bearing Restrictions: Yes LLE Weight Bearing: Touchdown weight bearing     Mobility  Bed Mobility Overal bed mobility: Modified Independent             General bed mobility comments: In recliner pre and post session Patient Response: Cooperative  Transfers Overall transfer level: Needs assistance Equipment used: Rolling walker (2 wheels) Transfers: Sit to/from Stand Sit to Stand: Supervision           General transfer comment: Good hand placement. Neede brief reminder how to ensure LE brace locked in extension prior to standing.    Ambulation/Gait Ambulation/Gait assistance: Supervision Gait Distance (Feet): 45 Feet (12' + 33' seated rest between.) Assistive device: Rolling walker (2 wheels) Gait Pattern/deviations: Step-to pattern;Trunk flexed       General Gait Details: Heavy reliance on UE's to maintain TDWB on LLE. Good understanding and maintenance of orthopedic precautions. Remains stable and steady.   Stairs             Wheelchair Mobility    Modified Rankin (Stroke Patients Only)       Balance Overall balance assessment: Needs assistance Sitting-balance support: No upper extremity supported;Feet unsupported Sitting balance-Leahy Scale: Normal Sitting balance - Comments: Good sitting balance reaching within BOS at EOB   Standing balance support: Bilateral upper extremity supported;During functional activity;Reliant on assistive device for balance Standing balance-Leahy Scale: Fair Standing balance comment: Relies on  UE support on RW                            Cognition Arousal/Alertness: Awake/alert Behavior  During Therapy: [facility] for tasks assessed/performed Overall Cognitive Status: Within Functional Limits for tasks assessed                                          Exercises Other Exercises Other Exercises: Education on locking/unlocking brace, precautions.    General Comments General comments (skin integrity, edema, etc.): Pt denying dizziness throughout      Pertinent Vitals/Pain Pain Assessment: No/denies pain    Home Living Family/patient expects to be discharged to:: Private residence Living Arrangements: Spouse/significant other Available Help at Discharge: Available PRN/intermittently Type of Home: House Home Access: Level entry       Home Layout: One level Home Equipment: Conservation officer, nature (2 wheels);Crutches;Transport chair      Prior Function            PT Goals (current goals can now be found in the care plan section) Acute Rehab PT Goals Patient Stated Goal: to go home PT Goal Formulation: With patient Time For Goal Achievement: 07/24/21 Potential to Achieve Goals: Good Progress towards PT goals: Progressing toward goals    Frequency    Min 2X/week      PT Plan Current plan remains appropriate    Co-evaluation              AM-PAC PT "6 Clicks" Mobility   Outcome Measure  Help needed turning from your back to your side while in a flat bed without using bedrails?: A Little Help needed moving from lying on your back to sitting on the side of a flat bed without using bedrails?: A Little Help needed moving to and from a bed to a chair (including a wheelchair)?: A Little Help needed standing up from a chair using your arms (e.g., wheelchair or bedside chair)?: A Little Help needed to walk in hospital room?: A Lot Help needed climbing 3-5 steps with a railing? : A Lot 6 Click Score: 16    End of Session Equipment Utilized During Treatment: Gait belt Activity Tolerance: Patient tolerated treatment well Patient left: in  chair Nurse Communication: Mobility status PT Visit Diagnosis: Other abnormalities of gait and mobility (R26.89);Muscle weakness (generalized) (M62.81)     Time: 8546-2703 PT Time Calculation (min) (ACUTE ONLY): 26 min  Charges:  $Gait Training: 23-37 mins                     Salem Caster. Fairly IV, PT, DPT Physical Therapist- [facility]  07/11/2021, 11:03 AM

## 2021-07-11 NOTE — Discharge Summary (Addendum)
Physician Discharge Summary  Sandra Phillips NKN:[id number] DOB: [birth year] DOA: 07/07/2021  PCP: [facility] [place] (Inactive)  Admit date: 07/07/2021 Discharge date: 07/11/2021  Admitted From: Home Disposition: Home  Recommendations for Outpatient Follow-up:  Follow up with vascular surgery within 1-2 weeks  Discharge Condition:stable, improved CODE STATUS:  Code Status: Prior  Regular healthy diet  Brief/Interim Summary: Pt presented with SOB and foot swelling in close proximity to recent surgery. She was on 325mg  ASA daily. CTA and doppler performed and found LLE DVT and bilateral PE. Vascular surgery performed pulmonary thrombectomy 12/19. Patient tolerated well and was able to wean to room air with ambulation prior to discharge. She was switched from heparin drip to eliquis with loading dose for one week and then continued until follow up instructions.   Discharge Diagnoses:  Principal Problem:   PE (pulmonary thromboembolism) ([place]) Active Problems:   Sinus tachycardia   Primary osteoarthritis   Popliteal DVT (deep venous thrombosis) (H[facility])   Acute deep vein thrombosis (DVT) of popliteal vein of left lower extremity (HCC)    Allergies as of 07/11/2021       Reactions   Penicillins    Unknown childhood reaction    Shrimp Flavor Swelling        Medication List     STOP taking these medications    aspirin EC 325 MG tablet       TAKE these medications    apixaban 5 MG Tabs tablet Commonly known as: ELIQUIS Take 2 tablets (10 mg total) by mouth 2 (two) times daily for 7 days.   apixaban 5 MG Tabs tablet Commonly known as: ELIQUIS Take 1 tablet (5 mg total) by mouth 2 (two) times daily. Start taking on: July 18, 2021   ibuprofen 200 MG tablet Commonly known as: ADVIL Take 400-800 mg by mouth every 6 (six) hours as needed for mild pain or moderate pain.   traMADol 50 MG tablet Commonly known as: Ultram Take 1 tablet (50 mg  total) by mouth every 6 (six) hours as needed for moderate pain.        Allergies  Allergen Reactions   Penicillins     Unknown childhood reaction    Shrimp Flavor Swelling    Consultations: Vascular surgery  Procedures/Studies: CT Angio Chest PE W and/or Wo Contrast  Result Date: 07/07/2021 CLINICAL DATA:  Pulmonary embolism (PE) suspected, high prob EXAM: CT ANGIOGRAPHY CHEST WITH CONTRAST TECHNIQUE: Multidetector CT imaging of the chest was performed using the standard protocol during bolus administration of intravenous contrast. Multiplanar CT image reconstructions and MIPs were obtained to evaluate the vascular anatomy. CONTRAST:  63mL OMNIPAQUE IOHEXOL 350 MG/ML SOLN COMPARISON:  None. FINDINGS: Cardiovascular: Positive for pulmonary emboli. There is a large clot burden within the right and left branch pulmonary arteries, extending into the bilateral lobar, segmental, and subsegmental branches of each lobe. The right atrium and right ventricle are dilated with RV-LV ratio measuring 1.6, consistent with right heart strain. The thoracic aorta demonstrates mild atherosclerotic calcifications. No pericardial disease. Mediastinum/Nodes: No mediastinal, hilar, or axillary lymphadenopathy. Thyroid is unremarkable. Tiny hiatal hernia. Lungs/Pleura: No focal airspace consolidation. Bibasilar hypoventilatory changes. No suspicious pulmonary nodules or masses. No pleural effusion. No pneumothorax. Upper Abdomen: Tiny left hepatic cyst.  No acute findings. Musculoskeletal: No acute osseous abnormality. No suspicious lytic or blastic lesions. Review of the MIP images confirms the above findings. IMPRESSION: Acute pulmonary emboli with extensive clot burden bilaterally as described above, and CT evidence  of right heart strain. Positive for acute PE with CT evidence of right heart strain (RV/LV Ratio = 1.6) consistent with at least submassive (intermediate risk) PE. The presence of right heart strain has  been associated with an increased risk of morbidity and mortality. Please refer to the "PE Focused" order set in EPIC. Critical Value/emergent results were called by telephone at the time of interpretation on 07/07/2021 at 5:40 pm to provider Merlyn Lot , who verbally acknowledged these results. Electronically Signed   By: Maurine Simmering M.D.   On: 07/07/2021 17:46   PERIPHERAL VASCULAR CATHETERIZATION  Result Date: 07/09/2021 See surgical note for result.  US Venous Img Lower Unilateral Left  Result Date: 07/07/2021 CLINICAL DATA:  Left lower extremity pain and swelling, history of recent knee surgery, initial encounter EXAM: LEFT LOWER EXTREMITY VENOUS DOPPLER ULTRASOUND TECHNIQUE: Gray-scale sonography with graded compression, as well as color Doppler and duplex ultrasound were performed to evaluate the lower extremity deep venous systems from the level of the common femoral vein and including the common femoral, femoral, profunda femoral, popliteal and calf veins including the posterior tibial, peroneal and gastrocnemius veins when visible. The superficial great saphenous vein was also interrogated. Spectral Doppler was utilized to evaluate flow at rest and with distal augmentation maneuvers in the common femoral, femoral and popliteal veins. COMPARISON:  None. FINDINGS: Contralateral Common Femoral Vein: Respiratory phasicity is normal and symmetric with the symptomatic side. No evidence of thrombus. Normal compressibility. Common Femoral Vein: No evidence of thrombus. Normal compressibility, respiratory phasicity and response to augmentation. Saphenofemoral Junction: No evidence of thrombus. Normal compressibility and flow on color Doppler imaging. Profunda Femoral Vein: No evidence of thrombus. Normal compressibility and flow on color Doppler imaging. Femoral Vein: No evidence of thrombus. Normal compressibility, respiratory phasicity and response to augmentation. Popliteal Vein: Thrombus is  noted with decreased compressibility. Calf Veins: Thrombus is noted within the posterior tibial vein. The peroneal vein is not well visualized. Superficial Great Saphenous Vein: No evidence of thrombus. Normal compressibility. Venous Reflux:  None. Other Findings:  None. IMPRESSION: Thrombus is noted within the popliteal and posterior tibial veins. Electronically Signed   By: Inez Catalina M.D.   On: 07/07/2021 18:27   DG Chest Port 1 View  Result Date: 07/07/2021 CLINICAL DATA:  Clinical concern for sepsis. Four weeks status post left knee meniscal surgery. EXAM: PORTABLE CHEST 1 VIEW COMPARISON:  None. FINDINGS: Cardiac silhouette normal in size.  No mediastinal or hilar masses. Clear lungs.  No pleural effusion or pneumothorax. Skeletal structures are grossly intact. IMPRESSION: No active disease. Electronically Signed   By: Lajean Manes M.D.   On: 07/07/2021 16:41   ECHOCARDIOGRAM COMPLETE  Result Date: 07/08/2021    ECHOCARDIOGRAM REPORT   Patient Name:   Sandra Phillips Date of Exam: 07/08/2021 Medical Rec #:  [id number]    Height:       68.0 in Accession #:    [id number]   Weight:       220.0 lb Date of Birth:  02-01-1951    BSA:          2.128 m Patient Age:    69 years     BP:           148/87 mmHg Patient Gender: F            HR:           90 bpm. Exam Location:  [facility] Procedure: 2D Echo and Intracardiac Opacification Agent Indications:  Pulmonary Embolus I26.09  History:         Patient has no prior history of Echocardiogram examinations.  Sonographer:     Kathlen Brunswick RDCS Referring Phys:  [id number] AMY N COX Diagnosing Phys: Serafina Royals MD  Sonographer Comments: Technically difficult study due to poor echo windows. IMPRESSIONS  1. Left ventricular ejection fraction, by estimation, is 60 to 65%. The left ventricle has normal function. The left ventricle has no regional wall motion abnormalities. Left ventricular diastolic parameters were normal.  2. Right ventricular systolic function is  mildly reduced. The right ventricular size is severely enlarged. Mildly increased right ventricular wall thickness. There is severely elevated pulmonary artery systolic pressure.  3. Right atrial size was moderately dilated.  4. The mitral valve is normal in structure. Trivial mitral valve regurgitation.  5. Tricuspid valve regurgitation is moderate to severe.  6. The aortic valve is normal in structure. Aortic valve regurgitation is not visualized. FINDINGS  Left Ventricle: Left ventricular ejection fraction, by estimation, is 60 to 65%. The left ventricle has normal function. The left ventricle has no regional wall motion abnormalities. Definity contrast agent was given IV to delineate the left ventricular  endocardial borders. The left ventricular internal cavity size was small. There is no left ventricular hypertrophy. Left ventricular diastolic parameters were normal. Right Ventricle: The right ventricular size is severely enlarged. Mildly increased right ventricular wall thickness. Right ventricular systolic function is mildly reduced. There is severely elevated pulmonary artery systolic pressure. Left Atrium: Left atrial size was normal in size. Right Atrium: Right atrial size was moderately dilated. Pericardium: There is no evidence of pericardial effusion. Mitral Valve: The mitral valve is normal in structure. Trivial mitral valve regurgitation. Tricuspid Valve: The tricuspid valve is normal in structure. Tricuspid valve regurgitation is moderate to severe. Aortic Valve: The aortic valve is normal in structure. Aortic valve regurgitation is not visualized. Aortic valve peak gradient measures 4.5 mmHg. Pulmonic Valve: The pulmonic valve was normal in structure. Pulmonic valve regurgitation is mild. Aorta: The aortic root and ascending aorta are structurally normal, with no evidence of dilitation. IAS/Shunts: No atrial level shunt detected by color flow Doppler.  LEFT VENTRICLE PLAX 2D LVIDd:         4.00 cm  Diastology LVIDs:         2.80 cm LV e' medial:    6.20 cm/s LV PW:         1.20 cm LV E/e' medial:  8.0 LV IVS:        1.20 cm LV e' lateral:   5.98 cm/s                        LV E/e' lateral: 8.3  RIGHT VENTRICLE RV Basal diam:  5.00 cm RV S prime:     7.94 cm/s TAPSE (M-mode): 1.1 cm LEFT ATRIUM           Index        RIGHT ATRIUM           Index LA diam:      2.80 cm 1.32 cm/m   RA Area:     24.50 cm LA Vol (A4C): 45.1 ml 21.19 ml/m  RA Volume:   91.20 ml  42.85 ml/m  AORTIC VALVE AV Vmax:      106.00 cm/s AV Peak Grad: 4.5 mmHg LVOT Vmax:    75.10 cm/s LVOT Vmean:   49.100 cm/s LVOT VTI:     0.129  m MITRAL VALVE               TRICUSPID VALVE MV Area (PHT): 8.34 cm    TV Peak grad:   36.2 mmHg MV Decel Time: 91 msec     TV Vmax:        3.01 m/s MV E velocity: 49.70 cm/s MV A velocity: 39.00 cm/s  SHUNTS MV E/A ratio:  1.27        Systemic VTI: 0.13 m Serafina Royals MD Electronically signed by Serafina Royals MD Signature Date/Time: 07/08/2021/3:20:41 PM    Final     Subjective: Patient denies chest pain, SOB. Endorses improvement in LE swelling. She is agreeable to going home today and endorses good understanding of her follow up appointments and medications.  Discharge Exam: Vitals:   07/11/21 0745 07/11/21 1118  BP: (!) 141/83 123/80  Pulse: 77 86  Resp: 18 18  Temp: 98.1 F (36.7 C) 99 F (37.2 C)  SpO2: 96% 99%    General: Pt is alert, awake, not in acute distress Cardiovascular: RRR, S1/S2 +, no rubs, no gallops Respiratory: CTA bilaterally, no wheezing, no rhonchi Abdominal: Soft, NT, ND, bowel sounds + Extremities: mild LE edema, no cyanosis  Labs: Basic Metabolic Panel: Recent Labs  Lab 07/07/21 1615 07/08/21 0429  NA 134* 140  K 4.0 4.2  CL 104 111  CO2 21* 22  GLUCOSE 173* 110*  BUN 17 12  CREATININE 0.86 0.74  CALCIUM 9.1 8.6*   CBC: Recent Labs  Lab 07/07/21 1615 07/08/21 0429 07/09/21 0353 07/10/21 0502 07/11/21 0604  WBC 10.8* 9.5 7.4 8.8 7.0   NEUTROABS 8.6*  --   --   --   --   HGB 13.9 13.6 12.4 12.5 11.3*  HCT 40.6 39.3 36.4 35.0* 32.7*  MCV 94.4 93.6 92.6 90.7 91.6  PLT 185 173 148* 177 147*    Microbiology Recent Results (from the past 240 hour(s))  Resp Panel by RT-PCR (Flu A&B, Covid) Nasopharyngeal Swab     Status: None   Collection Time: 07/07/21  4:15 PM   Specimen: Nasopharyngeal Swab; Nasopharyngeal(NP) swabs in vial transport medium  Result Value Ref Range Status   SARS Coronavirus 2 by RT PCR NEGATIVE NEGATIVE Final    Comment: (NOTE) SARS-CoV-2 target nucleic acids are NOT DETECTED.  The SARS-CoV-2 RNA is generally detectable in upper respiratory specimens during the acute phase of infection. The lowest concentration of SARS-CoV-2 viral copies this assay can detect is 138 copies/mL. A negative result does not preclude SARS-Cov-2 infection and should not be used as the sole basis for treatment or other patient management decisions. A negative result may occur with  improper specimen collection/handling, submission of specimen other than nasopharyngeal swab, presence of viral mutation(s) within the areas targeted by this assay, and inadequate number of viral copies(<138 copies/mL). A negative result must be combined with clinical observations, patient history, and epidemiological information. The expected result is Negative.  Fact Sheet for Patients:  EntrepreneurPulse.com.au  Fact Sheet for Healthcare Providers:  IncredibleEmployment.be  This test is no t yet approved or cleared by the [place] FDA and  has been authorized for detection and/or diagnosis of SARS-CoV-2 by FDA under an Emergency Use Authorization (EUA). This EUA will remain  in effect (meaning this test can be used) for the duration of the COVID-19 declaration under Section 564(b)(1) of the Act, 21 U.S.C.section 360bbb-3(b)(1), unless the authorization is terminated  or revoked sooner.  Influenza A by PCR NEGATIVE NEGATIVE Final   Influenza B by PCR NEGATIVE NEGATIVE Final    Comment: (NOTE) The Xpert Xpress SARS-CoV-2/FLU/RSV plus assay is intended as an aid in the diagnosis of influenza from Nasopharyngeal swab specimens and should not be used as a sole basis for treatment. Nasal washings and aspirates are unacceptable for Xpert Xpress SARS-CoV-2/FLU/RSV testing.  Fact Sheet for Patients: EntrepreneurPulse.com.au  Fact Sheet for Healthcare Providers: IncredibleEmployment.be  This test is not yet approved or cleared by the [place] FDA and has been authorized for detection and/or diagnosis of SARS-CoV-2 by FDA under an Emergency Use Authorization (EUA). This EUA will remain in effect (meaning this test can be used) for the duration of the COVID-19 declaration under Section 564(b)(1) of the Act, 21 U.S.C. section 360bbb-3(b)(1), unless the authorization is terminated or revoked.  Performed at [facility], [address] [place], [place] [address]   Blood Culture (routine x 2)     Status: None   Collection Time: 07/07/21  4:15 PM   Specimen: BLOOD  Result Value Ref Range Status   Specimen Description BLOOD BLOOD RIGHT ARM  Final   Special Requests   Final    BOTTLES DRAWN AEROBIC AND ANAEROBIC Blood Culture results may not be optimal due to an inadequate volume of blood received in culture bottles   Culture   Final    NO GROWTH 5 DAYS Performed at [facility], [address] [place], [place] [address]    Report Status 07/12/2021 FINAL  Final  Blood Culture (routine x 2)     Status: None   Collection Time: 07/07/21  4:15 PM   Specimen: BLOOD  Result Value Ref Range Status   Specimen Description BLOOD BLOOD RIGHT WRIST  Final   Special Requests   Final    BOTTLES DRAWN AEROBIC AND ANAEROBIC Blood Culture adequate volume   Culture   Final    NO GROWTH 5 DAYS Performed at [facility],  [address] [place], [place] [address]    Report Status 07/12/2021 FINAL  Final    Time coordinating discharge: Over 30 minutes  Richarda Osmond, MD  [facility] 07/12/2021, 2:03 PM

## 2021-07-11 NOTE — Progress Notes (Signed)
Received MD order to discharge patient to home, reviewed discharge instructions, home meds, prescriptions and follow up appointments with patient and patient verbalized understanding   

## 2021-07-11 NOTE — Evaluation (Signed)
Occupational Therapy Evaluation Patient Details Name: Sandra Phillips MRN: [id number] DOB: Jun 22, 1951 Today's Date: 07/11/2021   History of Present Illness Sandra Phillips is a 70 y.o. female with medical history significant for primary osteoarthritis of the left knee, complex tear of the medial meniscus of the left knee status post left knee arthroscopy with debridement and repair of the posterior medial root tear and abrasion  chondoplasty who presents to the emergency department from home for chief concerns of shortness of breath. Pt s/p pulmonary thrombectomy and catherter placement in B lungs 07/09/21.   Clinical Impression   Pt seen for OT evaluation this date. At baseline, pt is independent in all ADLs and functional mobility, living in a 1-story home with partner Saralyn Pilar). Pt currently requires MAX A for donning L sock, MIN GUARD for BSC transfers, and MIN GUARD for simulated tub bench transfers due to current functional impairments (See OT Problem List below). Pt would benefit from additional skilled OT services to maximize return to PLOF and minimize risk of future falls, injury, caregiver burden, and readmission. Upon discharge, recommend [place] services.         Recommendations for follow up therapy are one component of a multi-disciplinary discharge planning process, led by the attending physician.  Recommendations may be updated based on patient status, additional functional criteria and insurance authorization.   Follow Up Recommendations  Home health OT    Assistance Recommended at Discharge Intermittent Supervision/Assistance  Functional Status Assessment  Patient has had a recent decline in their functional status and demonstrates the ability to make significant improvements in function in a reasonable and predictable amount of time.  Equipment Recommendations  BSC/3in1;Tub/shower bench       Precautions / Restrictions Precautions Precautions: Knee Precaution Booklet Issued:  No Precaution Comments: TDWB with knee in terminal extension with brace donned. Can flex to 90 deg block in brace in NWB position. Required Braces or Orthoses: Knee Immobilizer - Left Knee Immobilizer - Left: On at all times;Other (comment) (Per ortho (via secure chat 12/20), can remove brace for bathing) Restrictions Weight Bearing Restrictions: Yes LLE Weight Bearing: Touchdown weight bearing      Mobility Bed Mobility Overal bed mobility: Modified Independent             General bed mobility comments: With [facility] elevated, required no physical assist    Transfers Overall transfer level: Needs assistance Equipment used: Rolling walker (2 wheels) Transfers: Sit to/from Stand;Bed to chair/wheelchair/BSC Sit to Stand: Min guard           General transfer comment: cuing for hand placement, good use of TDWB on LLE with brace locked in extension      Balance Overall balance assessment: Needs assistance Sitting-balance support: Bilateral upper extremity supported;Feet supported Sitting balance-Leahy Scale: Good Sitting balance - Comments: Good sitting balance reaching within BOS at EOB   Standing balance support: Bilateral upper extremity supported;During functional activity;Reliant on assistive device for balance Standing balance-Leahy Scale: Fair Standing balance comment: Relies on UE support on RW                           ADL either performed or assessed with clinical judgement   ADL Overall ADL's : Needs assistance/impaired                     Lower Body Dressing: Moderate assistance;Sitting/lateral leans Lower Body Dressing Details (indicate cue type and reason): MAX A to don/doff  L sock. Pt able to don/doff R sock indepedendently Toilet Transfer: Min guard;BSC/3in1;Ambulation;Rolling walker (2 wheels)       Tub/ Shower Transfer: Min guard;Ambulation;Tub bench;Rolling walker (2 wheels) Tub/Shower Transfer Details (indicate cue type and  reason): simulated at foot of bed Functional mobility during ADLs: Min guard;Rolling walker (2 wheels)       Vision Baseline Vision/History: 1 Wears glasses Ability to See in Adequate Light: 0 Adequate Patient Visual Report: No change from baseline              Pertinent Vitals/Pain Pain Assessment: No/denies pain     Hand Dominance Right   Extremity/Trunk Assessment Upper Extremity Assessment Upper Extremity Assessment: Overall WFL for tasks assessed   Lower Extremity Assessment Lower Extremity Assessment: Generalized weakness;LLE deficits/detail LLE Deficits / Details: Recent L meniscus repair   Cervical / Trunk Assessment Cervical / Trunk Assessment: Normal   Communication Communication Communication: No difficulties   Cognition Arousal/Alertness: Awake/alert Behavior During Therapy: WFL for tasks assessed/performed Overall Cognitive Status: Within Functional Limits for tasks assessed                                       General Comments  Pt denying dizziness throughout            Home Living Family/patient expects to be discharged to:: Private residence Living Arrangements: Spouse/significant other Available Help at Discharge: Available PRN/intermittently Type of Home: House Home Access: Level entry     Home Layout: One level     Bathroom Shower/Tub: Walk-in shower;Tub/shower unit   Bathroom Toilet: Handicapped height Bathroom Accessibility: Yes   Home Equipment: Conservation officer, nature (2 wheels);Crutches;Transport chair          Prior Functioning/Environment Prior Level of Function : Independent/Modified Independent;Needs assist       Physical Assist : Mobility (physical)     Mobility Comments: Sleeping in chair in living room. Use of crutches household distances ADLs Comments: partner helps with donning/doffing socks on L foot        OT Problem List: Decreased range of motion;Decreased activity tolerance;Impaired balance  (sitting and/or standing);Decreased knowledge of use of DME or AE      OT Treatment/Interventions: Self-care/ADL training;Therapeutic exercise;Energy conservation;DME and/or AE instruction;Therapeutic activities;Patient/family education;Balance training    OT Goals(Current goals can be found in the care plan section) Acute Rehab OT Goals Patient Stated Goal: to be able to shower OT Goal Formulation: With patient Time For Goal Achievement: 07/25/21 Potential to Achieve Goals: Good ADL Goals Pt Will Perform Lower Body Bathing: with set-up;sit to/from stand Pt Will Transfer to Toilet: with modified independence;ambulating;bedside commode Pt Will Perform Tub/Shower Transfer: with modified independence;ambulating;tub bench;rolling walker  OT Frequency: Min 2X/week    AM-PAC OT "6 Clicks" Daily Activity     Outcome Measure Help from another person eating meals?: None Help from another person taking care of personal grooming?: A Little Help from another person toileting, which includes using toliet, bedpan, or urinal?: A Little Help from another person bathing (including washing, rinsing, drying)?: A Little Help from another person to put on and taking off regular upper body clothing?: None Help from another person to put on and taking off regular lower body clothing?: A Lot 6 Click Score: 19   End of Session Equipment Utilized During Treatment: Rolling walker (2 wheels) Nurse Communication: Mobility status  Activity Tolerance: Patient tolerated treatment well Patient left: in chair;with call  bell/phone within reach;with family/visitor present  OT Visit Diagnosis: Unsteadiness on feet (R26.81)                Time: 2241-1464 OT Time Calculation (min): 44 min Charges:  OT General Charges $OT Visit: 1 Visit OT Evaluation $OT Eval Moderate Complexity: 1 Mod OT Treatments $Self Care/Home Management : 23-37 mins  Fredirick Maudlin, OTR/[place]

## 2021-07-11 NOTE — Discharge Instructions (Signed)
Please take 2 tablets of eliquis twice per day for a week. Followed by 1 tablet twice per day and continue this dose until otherwise discussed with your vascular surgeon. You will follow up with vascular in 2-3 weeks.  Call 911 if having acute worsening of your breathing not resolved with rest.

## 2021-07-11 NOTE — Care Management Important Message (Signed)
Important Message  Patient Details  Name: Sandra Phillips MRN: [id number] Date of Birth: [birth year]   Medicare Important Message Given:  Yes     Juliann Pulse A Adalee Kathan 07/11/2021, 10:17 AM

## 2021-07-12 ENCOUNTER — Telehealth (INDEPENDENT_AMBULATORY_CARE_PROVIDER_SITE_OTHER): Payer: Self-pay

## 2021-07-12 DIAGNOSIS — I2694 Multiple subsegmental pulmonary emboli without acute cor pulmonale: Secondary | ICD-10-CM | POA: Diagnosis not present

## 2021-07-12 DIAGNOSIS — I82432 Acute embolism and thrombosis of left popliteal vein: Secondary | ICD-10-CM | POA: Diagnosis not present

## 2021-07-12 DIAGNOSIS — M1712 Unilateral primary osteoarthritis, left knee: Secondary | ICD-10-CM | POA: Diagnosis not present

## 2021-07-12 DIAGNOSIS — Z4789 Encounter for other orthopedic aftercare: Secondary | ICD-10-CM | POA: Diagnosis not present

## 2021-07-12 DIAGNOSIS — K5900 Constipation, unspecified: Secondary | ICD-10-CM | POA: Diagnosis not present

## 2021-07-12 DIAGNOSIS — Z7901 Long term (current) use of anticoagulants: Secondary | ICD-10-CM | POA: Diagnosis not present

## 2021-07-12 DIAGNOSIS — I82442 Acute embolism and thrombosis of left tibial vein: Secondary | ICD-10-CM | POA: Diagnosis not present

## 2021-07-12 LAB — CULTURE, BLOOD (ROUTINE X 2)
Culture: NO GROWTH
Culture: NO GROWTH
Special Requests: ADEQUATE

## 2021-07-12 NOTE — Telephone Encounter (Signed)
Patient left a voicemail asking about compression and coupon for for Eliquis. Patient had pulmonary thrombectomy on 07/09/21. Patient will be sending someone to the office to pick up her compression stocking prescription,$10 copay and 30 day trial card for Eliquis. Patient was advise to put on compression stocking in the morning,remove at night,and not sleep in stockings.

## 2021-07-17 ENCOUNTER — Telehealth (INDEPENDENT_AMBULATORY_CARE_PROVIDER_SITE_OTHER): Payer: Self-pay

## 2021-07-17 ENCOUNTER — Other Ambulatory Visit (INDEPENDENT_AMBULATORY_CARE_PROVIDER_SITE_OTHER): Payer: Self-pay

## 2021-07-17 MED ORDER — APIXABAN 5 MG PO TABS: 5.0000 mg | ORAL_TABLET | Freq: Two times a day (BID) | ORAL | 0 refills | Status: DC

## 2021-07-17 NOTE — Telephone Encounter (Signed)
Medication has been sent to [facility]

## 2021-07-17 NOTE — Telephone Encounter (Signed)
She already has an Careers information officer at pharmacy.  That looks like it was sent in by Dr. Ouida Sills

## 2021-07-17 NOTE — Telephone Encounter (Signed)
The pt called and left a Vm on the nurses line saying she needs a RX for a 30 day free trial of eliquis to [facility] [address] in [place] she has the paper for the free trial but she is being told a Rx is required. Please advise.

## 2021-07-27 DIAGNOSIS — M1712 Unilateral primary osteoarthritis, left knee: Secondary | ICD-10-CM | POA: Diagnosis not present

## 2021-07-31 ENCOUNTER — Other Ambulatory Visit (INDEPENDENT_AMBULATORY_CARE_PROVIDER_SITE_OTHER): Payer: Self-pay | Admitting: Nurse Practitioner

## 2021-07-31 DIAGNOSIS — I82432 Acute embolism and thrombosis of left popliteal vein: Secondary | ICD-10-CM

## 2021-08-01 ENCOUNTER — Encounter (INDEPENDENT_AMBULATORY_CARE_PROVIDER_SITE_OTHER): Payer: Self-pay | Admitting: Nurse Practitioner

## 2021-08-01 ENCOUNTER — Ambulatory Visit (INDEPENDENT_AMBULATORY_CARE_PROVIDER_SITE_OTHER): Payer: Medicare Other | Admitting: Nurse Practitioner

## 2021-08-01 ENCOUNTER — Ambulatory Visit (INDEPENDENT_AMBULATORY_CARE_PROVIDER_SITE_OTHER): Payer: Medicare Other

## 2021-08-01 ENCOUNTER — Other Ambulatory Visit: Payer: Self-pay

## 2021-08-01 VITALS — BP 119/78 | HR 80 | Resp 16

## 2021-08-01 DIAGNOSIS — I2699 Other pulmonary embolism without acute cor pulmonale: Secondary | ICD-10-CM

## 2021-08-01 DIAGNOSIS — I82432 Acute embolism and thrombosis of left popliteal vein: Secondary | ICD-10-CM | POA: Diagnosis not present

## 2021-08-06 ENCOUNTER — Encounter (INDEPENDENT_AMBULATORY_CARE_PROVIDER_SITE_OTHER): Payer: Self-pay | Admitting: Nurse Practitioner

## 2021-08-06 ENCOUNTER — Encounter (INDEPENDENT_AMBULATORY_CARE_PROVIDER_SITE_OTHER): Payer: Self-pay

## 2021-08-06 NOTE — Progress Notes (Signed)
Subjective:    Patient ID: Sandra Phillips, female    DOB: [birth year], 71 y.o.   MRN: [id number] Chief Complaint  Patient presents with   Follow-up    [facility] 3wk le dvt    Sandra Phillips is a 71 year old female that presents today following thrombectomy on 07/09/2021 following pulmonary embolism of the right and left upper and lower lobes.  Since the patient has had her lobectomy she notes that her breathing is much improved.  She also notes that the swelling in her left lower extremity is much improved as well.  Much of this occurred following arthroscopy of her left knee.  She denies any swelling of her lower extremity.  She denies any excessive bleeding.  She has been tolerating her anticoagulation well.  Today noninvasive study showed no evidence of DVT in the left lower extremity.   Review of Systems  Respiratory:  Negative for chest tightness and shortness of breath.   Musculoskeletal:  Positive for gait problem.  All other systems reviewed and are negative.     Objective:   Physical Exam Vitals reviewed.  HENT:     Head: Normocephalic.  Cardiovascular:     Rate and Rhythm: Normal rate.  Pulmonary:     Effort: Pulmonary effort is normal.  Musculoskeletal:     Left lower leg: No edema.  Skin:    General: Skin is warm and dry.  Neurological:     Mental Status: She is alert and oriented to person, place, and time.  Psychiatric:        Mood and Affect: Mood normal.        Behavior: Behavior normal.        Thought Content: Thought content normal.        Judgment: Judgment normal.    BP 119/78 (BP Location: Right Arm)    Pulse 80    Resp 16   Past Medical History:  Diagnosis Date   Primary osteoarthritis     Social History   Socioeconomic History   Marital status: Unknown    Spouse name: Not on file   Number of children: Not on file   Years of education: Not on file   Highest education level: Not on file  Occupational History   Not on file  Tobacco Use   Smoking  status: Never   Smokeless tobacco: Never  Substance and Sexual Activity   Alcohol use: Yes    Alcohol/week: 1.0 standard drink    Types: 1 Glasses of wine per week    Comment: a glass at dinner   Drug use: Never   Sexual activity: Not Currently  Other Topics Concern   Not on file  Social History Narrative   Not on file   Social Determinants of Health   Financial Resource Strain: Not on file  Food Insecurity: Not on file  Transportation Needs: Not on file  Physical Activity: Not on file  Stress: Not on file  Social Connections: Not on file  Intimate Partner Violence: Not on file    Past Surgical History:  Procedure Laterality Date   KNEE ARTHROSCOPY Left 06/12/2021   Procedure: LEFT KNEE ARTHROSCOPY WITH DEBRIDEMENT AND REPAIR OF A POSTERIOR MEDIAL [place];  Surgeon: Corky Mull, MD;  Location: [facility];  Service: Orthopedics;  Laterality: Left;   PULMONARY THROMBECTOMY N/A 07/09/2021   Procedure: PULMONARY THROMBECTOMY;  Surgeon: Algernon Huxley, MD;  Location: [place];  Service: Cardiovascular;  Laterality: N/A;  Family History  Problem Relation Age of Onset   Cancer Mother    Rheum arthritis Sister     Allergies  Allergen Reactions   Penicillins     Unknown childhood reaction    Shrimp Flavor Swelling    Shrimp    CBC Latest Ref Rng & Units 07/11/2021 07/10/2021 07/09/2021  WBC 4.0 - 10.5 K/uL 7.0 8.8 7.4  Hemoglobin 12.0 - 15.0 g/dL 11.3(L) 12.5 12.4  Hematocrit 36.0 - 46.0 % 32.7(L) 35.0(L) 36.4  Platelets 150 - 400 K/uL 147(L) 177 148(L)      CMP     Component Value Date/Time   NA 140 07/08/2021 0429   NA 141 09/23/2012 0735   K 4.2 07/08/2021 0429   K 3.9 09/23/2012 0735   CL 111 07/08/2021 0429   CL 106 09/23/2012 0735   CO2 22 07/08/2021 0429   CO2 25 09/23/2012 0735   GLUCOSE 110 (H) 07/08/2021 0429   GLUCOSE 142 (H) 09/23/2012 0735   BUN 12 07/08/2021 0429   BUN 11 09/23/2012 0735   CREATININE  0.74 07/08/2021 0429   CREATININE 0.98 09/23/2012 0735   CALCIUM 8.6 (L) 07/08/2021 0429   CALCIUM 9.0 09/23/2012 0735   PROT 7.3 07/07/2021 1615   PROT 7.7 09/23/2012 0735   ALBUMIN 3.6 07/07/2021 1615   ALBUMIN 4.0 09/23/2012 0735   AST 23 07/07/2021 1615   AST 21 09/23/2012 0735   ALT 21 07/07/2021 1615   ALT 26 09/23/2012 0735   ALKPHOS 91 07/07/2021 1615   ALKPHOS 118 09/23/2012 0735   BILITOT 0.5 07/07/2021 1615   BILITOT 0.4 09/23/2012 0735   GFRNONAA >60 07/08/2021 0429   GFRNONAA >60 09/23/2012 0735   GFRAA >60 09/23/2012 0735     No results found.     Assessment & Plan:   1. PE (pulmonary thromboembolism) ([place]) Recommend:    No surgery or intervention at this point in time.  IVC filter is not indicated at present.   The patient is improved status post his pulmonary thrombectomy    The patient is tolerating his anticoagulation and is recommended to remain on anticoagulation for at least 1 year   Elevation was stressed, use of a recliner was discussed.   I have had a long discussion with the patient regarding DVT and post phlebitic changes such as swelling and why it  causes symptoms such as pain.  The patient will wear graduated compression stockings class 1 (20-30 mmHg), beginning after three full days of anticoagulation, on a daily basis a prescription was given. The patient will  beginning wearing the stockings first thing in the morning and removing them in the evening. The patient is instructed specifically not to sleep in the stockings.  In addition, behavioral modification including elevation during the day and avoidance of prolonged dependency will be initiated.     The patient will continue anticoagulation for now as there have not been any problems or complications at this point.  Patient is advised to follow-up in 1 year or sooner if issues arise.  2. Acute deep vein thrombosis (DVT) of popliteal vein of left lower extremity (HCC) Noninvasive studies  today show complete resolution of DVT left lower extremity.  Patient is advised to continue with use of medical grade compression stockings, when she is out of the immobilizer, to stop postphlebitic symptoms such as swelling and discomfort.  She is also advised to elevate lower extremity when possible.   Current Outpatient Medications on File Prior to Visit  Medication Sig Dispense Refill   apixaban (ELIQUIS) 5 MG TABS tablet Take 1 tablet (5 mg total) by mouth 2 (two) times daily. 60 tablet 0   ibuprofen (ADVIL) 200 MG tablet Take 400-800 mg by mouth every 6 (six) hours as needed for mild pain or moderate pain.     traMADol (ULTRAM) 50 MG tablet Take 1 tablet (50 mg total) by mouth every 6 (six) hours as needed for moderate pain. 40 tablet 0   apixaban (ELIQUIS) 5 MG TABS tablet Take 2 tablets (10 mg total) by mouth 2 (two) times daily for 7 days. 28 tablet 0   No current facility-administered medications on file prior to visit.    There are no Patient Instructions on file for this visit. No follow-ups on file.   Kris Hartmann, NP

## 2021-08-11 DIAGNOSIS — Z4789 Encounter for other orthopedic aftercare: Secondary | ICD-10-CM | POA: Diagnosis not present

## 2021-08-11 DIAGNOSIS — Z7901 Long term (current) use of anticoagulants: Secondary | ICD-10-CM | POA: Diagnosis not present

## 2021-08-11 DIAGNOSIS — I82432 Acute embolism and thrombosis of left popliteal vein: Secondary | ICD-10-CM | POA: Diagnosis not present

## 2021-08-11 DIAGNOSIS — K5900 Constipation, unspecified: Secondary | ICD-10-CM | POA: Diagnosis not present

## 2021-08-11 DIAGNOSIS — I2694 Multiple subsegmental pulmonary emboli without acute cor pulmonale: Secondary | ICD-10-CM | POA: Diagnosis not present

## 2021-08-11 DIAGNOSIS — M1712 Unilateral primary osteoarthritis, left knee: Secondary | ICD-10-CM | POA: Diagnosis not present

## 2021-08-11 DIAGNOSIS — I82442 Acute embolism and thrombosis of left tibial vein: Secondary | ICD-10-CM | POA: Diagnosis not present

## 2021-08-14 ENCOUNTER — Emergency Department: Payer: Medicare Other

## 2021-08-14 ENCOUNTER — Other Ambulatory Visit: Payer: Self-pay

## 2021-08-14 ENCOUNTER — Emergency Department
Admission: EM | Admit: 2021-08-14 | Discharge: 2021-08-14 | Disposition: A | Discharge status: Home / Self Care | Payer: Medicare Other | Attending: Emergency Medicine | Admitting: Emergency Medicine

## 2021-08-14 ENCOUNTER — Telehealth (INDEPENDENT_AMBULATORY_CARE_PROVIDER_SITE_OTHER): Payer: Self-pay

## 2021-08-14 DIAGNOSIS — G43909 Migraine, unspecified, not intractable, without status migrainosus: Secondary | ICD-10-CM | POA: Diagnosis not present

## 2021-08-14 DIAGNOSIS — G43B0 Ophthalmoplegic migraine, not intractable: Secondary | ICD-10-CM | POA: Insufficient documentation

## 2021-08-14 DIAGNOSIS — H538 Other visual disturbances: Secondary | ICD-10-CM | POA: Diagnosis present

## 2021-08-14 DIAGNOSIS — G43109 Migraine with aura, not intractable, without status migrainosus: Secondary | ICD-10-CM

## 2021-08-14 DIAGNOSIS — R42 Dizziness and giddiness: Secondary | ICD-10-CM | POA: Diagnosis not present

## 2021-08-14 NOTE — Telephone Encounter (Signed)
I've never heard of such a reaction with eliquis.  It could be related or coincidental.  Usually sudden blurred vision is concerning.  I would suggest she present to the ED for evaluation.

## 2021-08-14 NOTE — Telephone Encounter (Signed)
Patient was made aware with medical recommendations and verbalized understanding 

## 2021-08-14 NOTE — ED Triage Notes (Signed)
Pt here with blurred vision for 10 mins last night. Pt denies any other symptoms. Pt in NAD in triage. Pt had left knee surgery in Nov.

## 2021-08-14 NOTE — ED Provider Notes (Signed)
[facility] Provider Note  Patient Contact: 3:55 PM (approximate)   History   Blurred Vision   HPI  Sandra Phillips is a 71 y.o. female  presents to the ED for evaluation of now resolved symptoms of blurry vision last night.  Patient describes a few moments after she took her prescribed Eliquis, she explains about 10 minutes of blurry vision last night.  She denies any associated headache, visual field cuts, tinnitus, vertigo, paralysis, or facial droop.  She also denies any associated nausea, vomiting, dizziness, chest pain, or shortness of breath.  Patient does give a history of migraine headaches, but notes they are relatively infrequent.  She contacted her vascular surgeon today as she is currently on Eliquis for postop DVTs and PEs following arthroscopic knee surgery.  He advised that she report to the ED for further evaluation.  Patient presents at this time with completely resolved symptoms and no residual visual changes, vision loss, headache, or weakness.   Physical Exam   Triage Vital Signs: ED Triage Vitals [08/14/21 1336]  Enc Vitals Group     BP (!) 154/98     Pulse Rate 87     Resp 18     Temp 98.4 F (36.9 C)     Temp Source Oral     SpO2 96 %     Weight 220 lb (99.8 kg)     Height 5\' 8"  (1.727 m)     Head Circumference      Peak Flow      Pain Score 0     Pain Loc      Pain Edu?      Excl. in [place]?     Most recent vital signs: Vitals:   08/14/21 1336 08/14/21 1625  BP: (!) 154/98 129/87  Pulse: 87 74  Resp: 18 18  Temp: 98.4 F (36.9 C)   SpO2: 96% 96%     General: Alert and in no acute distress. Eyes:  PERRL. EOMI. normal fundi bilaterally Head: No acute traumatic findings Neck: No stridor. No cervical spine tenderness to palpation Cardiovascular:  Good peripheral perfusion Respiratory: Normal respiratory effort without tachypnea or retractions. Lungs CTAB.  Musculoskeletal: Full range of motion to all extremities.   Neurologic: Cranial nerves II to XII grossly intact.  No gross focal neurologic deficits are appreciated.  Skin:   No rash noted Other:   ED Results / Procedures / Treatments   Labs (all labs ordered are listed, but only abnormal results are displayed) Labs Reviewed - No data to display   EKG   RADIOLOGY  I personally viewed and evaluated these images as part of my medical decision making, as well as reviewing the written report by the radiologist.  ED Provider Interpretation: agree with report  CT HEAD WO CONTRAST (5MM)  Result Date: 08/14/2021 CLINICAL DATA:  Dizziness with blurred vision last night. EXAM: CT HEAD WITHOUT CONTRAST TECHNIQUE: Contiguous axial images were obtained from the base of the skull through the vertex without intravenous contrast. RADIATION DOSE REDUCTION: This exam was performed according to the departmental dose-optimization program which includes automated exposure control, adjustment of the mA and/or kV according to patient size and/or use of iterative reconstruction technique. COMPARISON:  None. FINDINGS: Brain: There is no evidence of acute intracranial hemorrhage, mass lesion, brain edema or extra-axial fluid collection. The ventricles and subarachnoid spaces are appropriately sized for age. There is no CT evidence of acute cortical infarction. Probable minimal chronic small vessel ischemic  changes in the periventricular white matter. Next Vascular: Intracranial vascular calcifications. No hyperdense vessel identified. Skull: Negative for fracture or focal lesion. Sinuses/Orbits: Small mucous retention cysts or polyps in the maxillary sinuses. The additional paranasal sinuses are clear without air-levels. The mastoid air cells and middle ears are clear. Other: None. IMPRESSION: No acute or significant abnormalities. Electronically Signed   By: Richardean Sale M.D.   On: 08/14/2021 15:06    PROCEDURES:  Critical Care performed:  No  Procedures   MEDICATIONS ORDERED IN ED: Medications - No data to display   IMPRESSION / MDM / [place] / ED COURSE  I reviewed the triage vital signs and the nursing notes.                              Differential diagnosis includes, but is not limited to, CVS, TIA, ocular migraine, retinal artery occlusion, retinal vein occlusion  Patient ED evaluation of fleeting episode of bilateral blurry vision last night without associated field cuts headache, nausea, vomiting, weakness.  Patient presents today for evaluation no acute distress without any ongoing symptoms.  She has a stable exam without funduscopic evidence of any retinal arterial or venous occlusion.  No CT evidence of any acute bleed, mass, or shift, all images reviewed by me.  No acute neuro deficits on exam concerning for TIA or CVA at this time.  Patient's diagnosis is consistent with probable ocular migraine versus nonspecific visual disturbance. Patient is to follow up with instructions to follow-up with her PCP as well as her ophthalmologist as needed or otherwise directed. Patient is given ED precautions to return to the ED for any worsening or new symptoms.    FINAL CLINICAL IMPRESSION(S) / ED DIAGNOSES   Final diagnoses:  Ocular migraine     Rx / DC Orders   ED Discharge Orders     None        Note:  This document was prepared using Dragon voice recognition software and may include unintentional dictation errors.    Melvenia Needles, PA-C 08/14/21 2015    [name], MD 08/14/21 2221

## 2021-08-14 NOTE — ED Notes (Signed)
Patient transported to CT 

## 2021-08-14 NOTE — Discharge Instructions (Signed)
Your exam and CT are normal at this time. There is no evidence of any serious findings. Your symptoms may represent an ocular migraine. Follow-up with your provider for continued symptoms. Return to the ED as discussed.

## 2021-08-28 ENCOUNTER — Other Ambulatory Visit: Payer: Self-pay | Admitting: Student in an Organized Health Care Education/Training Program

## 2021-08-28 ENCOUNTER — Other Ambulatory Visit (INDEPENDENT_AMBULATORY_CARE_PROVIDER_SITE_OTHER): Payer: Self-pay | Admitting: Vascular Surgery

## 2021-09-10 DIAGNOSIS — Z9889 Other specified postprocedural states: Secondary | ICD-10-CM | POA: Diagnosis not present

## 2021-09-12 DIAGNOSIS — Z9889 Other specified postprocedural states: Secondary | ICD-10-CM | POA: Diagnosis not present

## 2021-09-14 DIAGNOSIS — Z9889 Other specified postprocedural states: Secondary | ICD-10-CM | POA: Diagnosis not present

## 2021-09-17 DIAGNOSIS — M25662 Stiffness of left knee, not elsewhere classified: Secondary | ICD-10-CM | POA: Diagnosis not present

## 2021-09-19 DIAGNOSIS — M25662 Stiffness of left knee, not elsewhere classified: Secondary | ICD-10-CM | POA: Diagnosis not present

## 2021-09-21 DIAGNOSIS — M25662 Stiffness of left knee, not elsewhere classified: Secondary | ICD-10-CM | POA: Diagnosis not present

## 2021-09-24 DIAGNOSIS — M25662 Stiffness of left knee, not elsewhere classified: Secondary | ICD-10-CM | POA: Diagnosis not present

## 2021-09-26 DIAGNOSIS — M25662 Stiffness of left knee, not elsewhere classified: Secondary | ICD-10-CM | POA: Diagnosis not present

## 2021-09-28 DIAGNOSIS — M25662 Stiffness of left knee, not elsewhere classified: Secondary | ICD-10-CM | POA: Diagnosis not present

## 2021-10-01 DIAGNOSIS — M25662 Stiffness of left knee, not elsewhere classified: Secondary | ICD-10-CM | POA: Diagnosis not present

## 2021-10-03 DIAGNOSIS — M25662 Stiffness of left knee, not elsewhere classified: Secondary | ICD-10-CM | POA: Diagnosis not present

## 2021-10-05 DIAGNOSIS — M25662 Stiffness of left knee, not elsewhere classified: Secondary | ICD-10-CM | POA: Diagnosis not present

## 2021-10-08 DIAGNOSIS — M25662 Stiffness of left knee, not elsewhere classified: Secondary | ICD-10-CM | POA: Diagnosis not present

## 2021-10-11 DIAGNOSIS — M25662 Stiffness of left knee, not elsewhere classified: Secondary | ICD-10-CM | POA: Diagnosis not present

## 2021-10-15 DIAGNOSIS — M25462 Effusion, left knee: Secondary | ICD-10-CM | POA: Diagnosis not present

## 2021-10-15 DIAGNOSIS — M25562 Pain in left knee: Secondary | ICD-10-CM | POA: Diagnosis not present

## 2021-10-18 DIAGNOSIS — M25462 Effusion, left knee: Secondary | ICD-10-CM | POA: Diagnosis not present

## 2021-10-18 DIAGNOSIS — M25562 Pain in left knee: Secondary | ICD-10-CM | POA: Diagnosis not present

## 2021-10-19 DIAGNOSIS — S83232D Complex tear of medial meniscus, current injury, left knee, subsequent encounter: Secondary | ICD-10-CM | POA: Diagnosis not present

## 2021-10-19 DIAGNOSIS — M1712 Unilateral primary osteoarthritis, left knee: Secondary | ICD-10-CM | POA: Diagnosis not present

## 2021-10-22 DIAGNOSIS — M25462 Effusion, left knee: Secondary | ICD-10-CM | POA: Diagnosis not present

## 2021-10-22 DIAGNOSIS — M25562 Pain in left knee: Secondary | ICD-10-CM | POA: Diagnosis not present

## 2021-11-06 DIAGNOSIS — M25562 Pain in left knee: Secondary | ICD-10-CM | POA: Diagnosis not present

## 2021-11-06 DIAGNOSIS — M25462 Effusion, left knee: Secondary | ICD-10-CM | POA: Diagnosis not present

## 2021-11-07 ENCOUNTER — Telehealth: Payer: Self-pay

## 2021-11-07 NOTE — Telephone Encounter (Signed)
Pt would like to reestablish with provider. Pt has not seen provider since covid started  ?

## 2021-11-08 DIAGNOSIS — M25562 Pain in left knee: Secondary | ICD-10-CM | POA: Diagnosis not present

## 2021-11-08 DIAGNOSIS — M25462 Effusion, left knee: Secondary | ICD-10-CM | POA: Diagnosis not present

## 2021-11-08 NOTE — Telephone Encounter (Signed)
Spoke with pt and scheduled her for an appt in May. Pt is aware of appt date and time.  ?

## 2021-11-14 DIAGNOSIS — M25462 Effusion, left knee: Secondary | ICD-10-CM | POA: Diagnosis not present

## 2021-11-14 DIAGNOSIS — M25562 Pain in left knee: Secondary | ICD-10-CM | POA: Diagnosis not present

## 2021-11-20 DIAGNOSIS — M25462 Effusion, left knee: Secondary | ICD-10-CM | POA: Diagnosis not present

## 2021-11-20 DIAGNOSIS — M25562 Pain in left knee: Secondary | ICD-10-CM | POA: Diagnosis not present

## 2021-11-22 DIAGNOSIS — M25662 Stiffness of left knee, not elsewhere classified: Secondary | ICD-10-CM | POA: Diagnosis not present

## 2021-11-29 DIAGNOSIS — B029 Zoster without complications: Secondary | ICD-10-CM | POA: Diagnosis not present

## 2021-12-04 ENCOUNTER — Telehealth: Payer: Self-pay

## 2021-12-04 NOTE — Telephone Encounter (Signed)
Patient states she is scheduled to have fasting labs tomorrow.  Patient states she takes her Eliquis and Valtrex at 6:30am and would like to know if she should go ahead and take the medication with water, or wait to take it until after her appointment. ?

## 2021-12-05 ENCOUNTER — Encounter: Payer: Self-pay | Admitting: Internal Medicine

## 2021-12-05 ENCOUNTER — Ambulatory Visit (INDEPENDENT_AMBULATORY_CARE_PROVIDER_SITE_OTHER): Payer: Medicare Other | Admitting: Internal Medicine

## 2021-12-05 VITALS — BP 150/78 | HR 80 | Temp 98.1°F | Ht 67.0 in | Wt 223.6 lb

## 2021-12-05 DIAGNOSIS — R195 Other fecal abnormalities: Secondary | ICD-10-CM

## 2021-12-05 DIAGNOSIS — Z1159 Encounter for screening for other viral diseases: Secondary | ICD-10-CM

## 2021-12-05 DIAGNOSIS — Z114 Encounter for screening for human immunodeficiency virus [HIV]: Secondary | ICD-10-CM | POA: Diagnosis not present

## 2021-12-05 DIAGNOSIS — R7301 Impaired fasting glucose: Secondary | ICD-10-CM

## 2021-12-05 DIAGNOSIS — Z86718 Personal history of other venous thrombosis and embolism: Secondary | ICD-10-CM

## 2021-12-05 DIAGNOSIS — Z780 Asymptomatic menopausal state: Secondary | ICD-10-CM | POA: Diagnosis not present

## 2021-12-05 DIAGNOSIS — Z1211 Encounter for screening for malignant neoplasm of colon: Secondary | ICD-10-CM | POA: Diagnosis not present

## 2021-12-05 DIAGNOSIS — E669 Obesity, unspecified: Secondary | ICD-10-CM

## 2021-12-05 DIAGNOSIS — Z79899 Other long term (current) drug therapy: Secondary | ICD-10-CM

## 2021-12-05 DIAGNOSIS — E785 Hyperlipidemia, unspecified: Secondary | ICD-10-CM

## 2021-12-05 DIAGNOSIS — Z1231 Encounter for screening mammogram for malignant neoplasm of breast: Secondary | ICD-10-CM

## 2021-12-05 DIAGNOSIS — Z86711 Personal history of pulmonary embolism: Secondary | ICD-10-CM

## 2021-12-05 DIAGNOSIS — I700 Atherosclerosis of aorta: Secondary | ICD-10-CM

## 2021-12-05 LAB — CBC WITH DIFFERENTIAL/PLATELET
Basophils Absolute: 0 10*3/uL (ref 0.0–0.1)
Basophils Relative: 0.3 % (ref 0.0–3.0)
Eosinophils Absolute: 0.2 10*3/uL (ref 0.0–0.7)
Eosinophils Relative: 1.6 % (ref 0.0–5.0)
HCT: 44.1 % (ref 36.0–46.0)
Hemoglobin: 14.8 g/dL (ref 12.0–15.0)
Lymphocytes Relative: 26.8 % (ref 12.0–46.0)
Lymphs Abs: 2.6 10*3/uL (ref 0.7–4.0)
MCHC: 33.5 g/dL (ref 30.0–36.0)
MCV: 91.3 fl (ref 78.0–100.0)
Monocytes Absolute: 0.8 10*3/uL (ref 0.1–1.0)
Monocytes Relative: 8.1 % (ref 3.0–12.0)
Neutro Abs: 6.1 10*3/uL (ref 1.4–7.7)
Neutrophils Relative %: 63.2 % (ref 43.0–77.0)
Platelets: 214 10*3/uL (ref 150.0–400.0)
RBC: 4.83 Mil/uL (ref 3.87–5.11)
RDW: 15.3 % (ref 11.5–15.5)
WBC: 9.6 10*3/uL (ref 4.0–10.5)

## 2021-12-05 LAB — LIPID PANEL
Cholesterol: 227 mg/dL — ABNORMAL HIGH (ref 0–200)
HDL: 61.8 mg/dL (ref >39.00)
LDL Cholesterol: 141 mg/dL — ABNORMAL HIGH (ref 0–99)
NonHDL: 165.3
Total CHOL/HDL Ratio: 4
Triglycerides: 123 mg/dL (ref 0.0–149.0)
VLDL: 24.6 mg/dL (ref 0.0–40.0)

## 2021-12-05 LAB — COMPREHENSIVE METABOLIC PANEL
ALT: 17 U/L (ref 0–35)
AST: 13 U/L (ref 0–37)
Albumin: 4.2 g/dL (ref 3.5–5.2)
Alkaline Phosphatase: 94 U/L (ref 39–117)
BUN: 16 mg/dL (ref 6–23)
CO2: 28 mEq/L (ref 19–32)
Calcium: 9.3 mg/dL (ref 8.4–10.5)
Chloride: 104 mEq/L (ref 96–112)
Creatinine, Ser: 0.89 mg/dL (ref 0.40–1.20)
GFR: 66.57 mL/min (ref >60.00)
Glucose, Bld: 95 mg/dL (ref 70–99)
Potassium: 4.3 mEq/L (ref 3.5–5.1)
Sodium: 141 mEq/L (ref 135–145)
Total Bilirubin: 0.5 mg/dL (ref 0.2–1.2)
Total Protein: 7.1 g/dL (ref 6.0–8.3)

## 2021-12-05 LAB — TSH: TSH: 3.24 u[IU]/mL (ref 0.35–5.50)

## 2021-12-05 LAB — HEMOGLOBIN A1C: Hgb A1c MFr Bld: 5.5 % (ref 4.6–6.5)

## 2021-12-05 NOTE — Telephone Encounter (Signed)
Pt was made aware that she can take her medication with water or black coffee the morning of her appt.  ?

## 2021-12-05 NOTE — Patient Instructions (Signed)
Welcome! ? ? ?We are getting you "caught up" on all screenings: ? ?Cologuard (stool test) for colon ca screening ? ?Mammogram ? ?DEXA scan  (bone density) ? ?Screening for diabetes,  hight cholesterol, anemia, thyroid etc ? ? ?

## 2021-12-05 NOTE — Progress Notes (Signed)
Subjective:  Patient ID: Sandra Phillips, female    DOB: 06/12/1951  Age: 71 y.o. MRN: [id number]  CC: The primary encounter diagnosis was Postmenopausal estrogen deficiency. Diagnoses of Colon cancer screening, Encounter for screening mammogram for malignant neoplasm of breast, Hyperlipidemia, unspecified hyperlipidemia type, Need for hepatitis C screening test, Impaired fasting glucose, Long-term use of high-risk medication, Encounter for screening for HIV, History of deep venous thrombosis (DVT) of distal vein of left lower extremity, History of pulmonary embolism, Aortic atherosclerosis ([place]), and Obesity (BMI 35.0-39.9 without comorbidity) were also pertinent to this visit.  HPI [name] presents for establishment of care   History Olga has a past medical history of Current long-term use of anticoagulant medication with history of deep venous thrombosis (DVT), History of DVT (deep vein thrombosis), and Primary osteoarthritis.   She has a past surgical history that includes Knee arthroscopy (Left, 06/12/2021) and PULMONARY THROMBECTOMY (N/A, 07/09/2021).   Her family history includes Cancer in her mother; Rheum arthritis in her sister.She reports that she has never smoked. She has never used smokeless tobacco. She reports current alcohol use of about 1.0 standard drink per week. She reports that she does not use drugs.  She has a history of a Postoperative DVT/PE that occurred while recovering from a meniscal repair of left knee in December.  Caused by immobilization.   She will remain on  eliquis ax 1 yr.   Walking without cane for the first time since surgery in December  Recovering from shingles on right neck and chest wall.  On valacyclovir.   Obesity:y:  her weight has been stable for several years despite a careful diet  and regular participation in exercise.  She is somewhat frustrated by her inability to get her BMI < 30.  Reviewed her diet and the intensity of her exercise in  detail.  She has not done a calorie count yet, but eats "the right things" most of the time.  Discussed several of the successful commercial diet plans available with regard to the reason for their success as well as the reason that patients fail to maintain weight loss.   Behind in screenings due to   loss of insurance   Outpatient Medications Prior to Visit  Medication Sig Dispense Refill   acetaminophen (TYLENOL) 500 MG tablet Take by mouth.     ELIQUIS 5 MG TABS tablet TAKE ONE (1) TABLET BY MOUTH TWO TIMES PER DAY 60 tablet 6   valACYclovir (VALTREX) 1000 MG tablet Take 1,000 mg by mouth 3 (three) times daily.     apixaban (ELIQUIS) 5 MG TABS tablet Take 2 tablets (10 mg total) by mouth 2 (two) times daily for 7 days. 28 tablet 0   apixaban (ELIQUIS) 5 MG TABS tablet Take 1 tablet (5 mg total) by mouth 2 (two) times daily. 60 tablet 0   ibuprofen (ADVIL) 200 MG tablet Take 400-800 mg by mouth every 6 (six) hours as needed for mild pain or moderate pain.     traMADol (ULTRAM) 50 MG tablet Take 1 tablet (50 mg total) by mouth every 6 (six) hours as needed for moderate pain. (Patient not taking: Reported on 12/04/2021) 40 tablet 0   No facility-administered medications prior to visit.    Review of Systems:  Patient denies headache, fevers, malaise, unintentional weight loss, skin rash, eye pain, sinus congestion and sinus pain, sore throat, dysphagia,  hemoptysis , cough, dyspnea, wheezing, chest pain, palpitations, orthopnea, edema, abdominal pain, nausea, melena, diarrhea,  constipation, flank pain, dysuria, hematuria, urinary  Frequency, nocturia, numbness, tingling, seizures,  Focal weakness, Loss of consciousness,  Tremor, insomnia, depression, anxiety, and suicidal ideation.     Objective:  BP (!) 150/78 (BP Location: Left Arm, Patient Position: Sitting, Cuff Size: Large)   Pulse 80   Temp 98.1 F (36.7 C) (Oral)   Ht _0  (1.702 m)   Wt 223 lb 9.6 oz (101.4 kg)   SpO2 98%   BMI  35.02 kg/m   Physical Exam:  General appearance: alert, cooperative and appears stated age Ears: normal TM's and external ear canals both ears Throat: lips, mucosa, and tongue normal; teeth and gums normal Neck: no adenopathy, no carotid bruit, supple, symmetrical, trachea midline and thyroid not enlarged, symmetric, no tenderness/mass/nodules Back: symmetric, no curvature. ROM normal. No CVA tenderness. Lungs: clear to auscultation bilaterally Heart: regular rate and rhythm, S1, S2 normal, no murmur, click, rub or gallop Abdomen: soft, non-tender; bowel sounds normal; no masses,  no organomegaly Pulses: 2+ and symmetric Skin: Skin color, texture, turgor normal. No rashes or lesions Lymph nodes: Cervical, supraclavicular, and axillary nodes normal.  Assessment & Plan:   Problem List Items Addressed This Visit     Aortic atherosclerosis ([place])    Mild placque noted on Dec 2022 CTA during  admission for PE.  Will discuss need for statin therapy at follow up       History of deep venous thrombosis (DVT) of distal vein of left lower extremity    Occurred in the setting of post operative immobilization. .  She has been advised to  remain on Eliquis for one year due to the seriousness of her VTE requiring a pulmonary thrombectomy       History of pulmonary embolism    Occurring postoperatively in December 2022. ,  Requiring thrombectomy for management of large clot burden with evidence of right heart strain.   .  Continue Eliquis for one year .       Obesity (BMI 35.0-39.9 without comorbidity)    I have addressed  BMI and recommended a low glycemic index diet utilizing smaller more frequent meals to increase metabolism.  I have also recommended that patient start exercising with a goal of 30 minutes of aerobic exercise a minimum of 5 days per week. Screening for lipid disorders, thyroid and diabetes to be done today.         Other Visit Diagnoses     Postmenopausal estrogen  deficiency    -  Primary   Relevant Orders   DG Bone Density   Colon cancer screening       Relevant Orders   Cologuard   Encounter for screening mammogram for malignant neoplasm of breast       Relevant Orders   MM 3D SCREEN BREAST BILATERAL   Hyperlipidemia, unspecified hyperlipidemia type       Relevant Orders   Lipid Profile (Completed)   Need for hepatitis C screening test       Relevant Orders   Hepatitis C Antibody (Completed)   Impaired fasting glucose       Relevant Orders   Comp Met (CMET) (Completed)   HgB A1c (Completed)   Long-term use of high-risk medication       Relevant Orders   TSH (Completed)   CBC with Differential/Platelet (Completed)   Encounter for screening for HIV       Relevant Medications   valACYclovir (VALTREX) 1000 MG tablet   Other Relevant Orders  HIV antibody (with reflex) (Completed)       Medications Discontinued During This Encounter  Medication Reason   ibuprofen (ADVIL) 200 MG tablet    apixaban (ELIQUIS) 5 MG TABS tablet Duplicate   apixaban (ELIQUIS) 5 MG TABS tablet Duplicate   traMADol (ULTRAM) 50 MG tablet     Follow-up: Return in about 6 months (around 06/07/2022).   [name], MD

## 2021-12-06 DIAGNOSIS — M25662 Stiffness of left knee, not elsewhere classified: Secondary | ICD-10-CM | POA: Diagnosis not present

## 2021-12-06 LAB — HEPATITIS C ANTIBODY
Hepatitis C Ab: NONREACTIVE
SIGNAL TO CUT-OFF: 0.22 (ref <1.00)

## 2021-12-06 LAB — HIV ANTIBODY (ROUTINE TESTING W REFLEX): HIV 1&2 Ab, 4th Generation: NONREACTIVE

## 2021-12-08 DIAGNOSIS — I700 Atherosclerosis of aorta: Secondary | ICD-10-CM | POA: Insufficient documentation

## 2021-12-08 DIAGNOSIS — E669 Obesity, unspecified: Secondary | ICD-10-CM | POA: Insufficient documentation

## 2021-12-08 NOTE — Assessment & Plan Note (Addendum)
Mild placque noted on Dec 2022 CTA during  admission for PE.  Will discuss need for statin therapy at follow up

## 2021-12-08 NOTE — Assessment & Plan Note (Signed)
Occurred in the setting of post operative immobilization. .  She has been advised to  remain on Eliquis for one year due to the seriousness of her VTE requiring a pulmonary thrombectomy

## 2021-12-08 NOTE — Assessment & Plan Note (Addendum)
Occurring postoperatively in December 2022. ,  Requiring thrombectomy for management of large clot burden with evidence of right heart strain.   .  Continue Eliquis for one year .

## 2021-12-08 NOTE — Assessment & Plan Note (Signed)
I have addressed  BMI and recommended a low glycemic index diet utilizing smaller more frequent meals to increase metabolism.  I have also recommended that patient start exercising with a goal of 30 minutes of aerobic exercise a minimum of 5 days per week. Screening for lipid disorders, thyroid and diabetes to be done today.   

## 2021-12-10 ENCOUNTER — Telehealth: Payer: Self-pay

## 2021-12-10 DIAGNOSIS — M25662 Stiffness of left knee, not elsewhere classified: Secondary | ICD-10-CM | POA: Diagnosis not present

## 2021-12-10 NOTE — Telephone Encounter (Signed)
Pt returning call

## 2021-12-10 NOTE — Telephone Encounter (Signed)
Pt does not have an appt scheduled until November. Is it okay to wait that long to discuss statin therapy or does she need to schedule a CPE sooner?

## 2021-12-10 NOTE — Telephone Encounter (Signed)
LMTCB in regards to lab results.  

## 2021-12-10 NOTE — Telephone Encounter (Signed)
-----   Message from [name], MD sent at 12/08/2021  6:12 PM EDT ----- Your CBC,  A1c,  cholesterol,  liver and kidney function have been reviewed and there are no significant or worrisome findings .HOwever,  statin therapy is now advised all adults who have evidence of  aortic atherosclerosis on imaging studies,  as was noted on your December CT scan.    WE'll discuss at your upcoming CPE .  I have changed your mammogram to [place], because [facility] would not allow me to order it (because I have not done a breast exam.  This is a new policy that I do not agree with,  so we will bypass them )    Regards,  Dr. Derrel Nip

## 2021-12-11 NOTE — Telephone Encounter (Signed)
LMTCB

## 2021-12-12 NOTE — Telephone Encounter (Signed)
Patient scheduled for physical in June.

## 2021-12-13 ENCOUNTER — Telehealth (INDEPENDENT_AMBULATORY_CARE_PROVIDER_SITE_OTHER): Payer: Medicare Other | Admitting: Internal Medicine

## 2021-12-13 ENCOUNTER — Encounter: Payer: Self-pay | Admitting: Internal Medicine

## 2021-12-13 VITALS — Temp 99.1°F

## 2021-12-13 DIAGNOSIS — U071 COVID-19: Secondary | ICD-10-CM | POA: Diagnosis not present

## 2021-12-13 DIAGNOSIS — Z8616 Personal history of COVID-19: Secondary | ICD-10-CM

## 2021-12-13 HISTORY — DX: Personal history of COVID-19: Z86.16

## 2021-12-13 MED ORDER — MOLNUPIRAVIR EUA 200MG CAPSULE: 4.0000 | ORAL_CAPSULE | Freq: Two times a day (BID) | ORAL | 0 refills | Status: AC

## 2021-12-13 NOTE — Patient Instructions (Addendum)
Molnupiravir Capsules What is this medication? MOLNUPIRAVIR (MOL nue PIR a vir) treats mild to moderate COVID-19. It may help people who are at high risk of developing severe illness. This medication works by limiting the spread of the virus in your body. The FDA has allowed the emergency use of this medication. This medicine may be used for other purposes; ask your health care provider or pharmacist if you have questions. COMMON BRAND NAME(S): LAGEVRIO What should I tell my care team before I take this medication? They need to know if you have any of these conditions: Any allergies Any serious illness An unusual or allergic reaction to molnupiravir, other medications, foods, dyes, or preservatives Pregnant or trying to get pregnant Breast-feeding How should I use this medication? Take this medication by mouth with water. Take it as directed on the prescription label at the same time every day. Do not cut, crush, or chew this medication. Swallow the capsules whole. You can take it with or without food. If it upsets your stomach, take it with food. Take all of it unless your care team tells you to stop it early. Keep taking it even if you think you are better. Talk to your care team about the use of this medication in children. Special care may be needed. Overdosage: If you think you have taken too much of this medicine contact a poison control center or emergency room at once. NOTE: This medicine is only for you. Do not share this medicine with others. What if I miss a dose? If you miss a dose, take it as soon as you can unless it is more than 10 hours late. If it is more than 10 hours late, skip the missed dose. Take the next dose at the normal time. Do not take extra or 2 doses at the same time to make up for the missed dose. What may interact with this medication? Interactions have not been studied. This list may not describe all possible interactions. Give your health care provider a list of  all the medicines, herbs, non-prescription drugs, or dietary supplements you use. Also tell them if you smoke, drink alcohol, or use illegal drugs. Some items may interact with your medicine. What should I watch for while using this medication? Your condition will be monitored carefully while you are receiving this medication. Visit your care team for regular checkups. Tell your care team if your symptoms do not start to get better or if they get worse. Do not become pregnant while taking this medication. You may need a pregnancy test before starting this medication. Women must use a reliable form of birth control while taking this medication and for 4 days after stopping the medication. Women should inform their care team if they wish to become pregnant or think they might be pregnant. Men should not father a child while taking this medication and for 3 months after stopping it. There is potential for serious harm to an unborn child. Talk to your care team for more information. Do not breast-feed an infant while taking this medication and for 4 days after stopping the medication. What side effects may I notice from receiving this medication? Side effects that you should report to your care team as soon as possible: Allergic reactions--skin rash, itching, hives, swelling of the face, lips, tongue, or throat Side effects that usually do not require medical attention (report these to your care team if they continue or are bothersome): Diarrhea Dizziness Nausea This list may not  describe all possible side effects. Call your doctor for medical advice about side effects. You may report side effects to FDA at 1-800-FDA-1088. Where should I keep my medication? Keep out of the reach of children and pets. Store at room temperature between 20 and 25 degrees C (68 and 77 degrees F). Get rid of any unused medication after the expiration date. To get rid of medications that are no longer needed or have  expired: Take the medication to a medication take-back program. Check with your pharmacy or law enforcement to find a location. If you cannot return the medication, check the label or package insert to see if the medication should be thrown out in the garbage or flushed down the toilet. If you are not sure, ask your care team. If it is safe to put it in the trash, take the medication out of the container. Mix the medication with cat litter, dirt, coffee grounds, or other unwanted substance. Seal the mixture in a bag or container. Put it in the trash. NOTE: This sheet is a summary. It may not cover all possible information. If you have questions about this medicine, talk to your doctor, pharmacist, or health care provider.  2023 Elsevier/Gold Standard (2021-09-03 00:00:00)  If needing prescription strength medication we will need to make an appointment with a provider.  These are over the counter medication options:  Mucinex dm green label for cough or robitussin DM  Multivitamin or below vitamins  Vitamin C 1000 mg daily.  Vitamin D3 4000 Iu (units) daily.  Zinc 100 mg daily.  Quercetin 250-500 mg 2 times per day   Elderberry  Oil of oregano  cepacol or chloroseptic spray Warm salt water gargles +hydrogen peroxide Sugar free cough drops  Warm tea with honey and lemon  Hydration  Try to eat though you dont feel like it   Tylenol or Advil  Nasal saline and Flonase 2 sprays nasal congestion  If sneezing/runny nose over the counter allergy pill claritin,allegra, zyrtec, xyzal Quarantine x 10-14 days 14 days preferred   Monitor pulse oximeter, buy from [facility] if oxygen is less than 90 please go to the hospital.        Are you feeling really sick? Shortness of breath, cough, chest pain?, dizziness? Confusion   If so let me know  If worsening, go to hospital or [facility] Urgent care for further treatment.

## 2021-12-13 NOTE — Progress Notes (Signed)
Virtual Visit via Video Note  I connected Sandra Phillips   on 12/13/21 at 10:40 AM EDT by a video enabled telemedicine application and verified that I am speaking with the correct person using two identifiers.  Location patient: [place] Location provider:work or home office Persons participating in the virtual visit: patient, provider  I discussed the limitations and requested verbal permission for telemedicine visit. The patient expressed understanding and agreed to proceed.   HPI:  Acute telemedicine visit for : Covid 19 + 12/13/21 new and 1st time had ? How she got but is in PT [facility] having sore throat, h/a, body aches, stuffy nose   -Pertinent past medical history: see below -Pertinent medication allergies: Allergies  Allergen Reactions   Penicillins     Unknown childhood reaction    Shrimp Extract Allergy Skin Test Swelling    Shrimp Shrimp    Shrimp Flavor Swelling    Shrimp   -COVID-19 vaccine status:  Immunization History  Administered Date(s) Administered   Moderna Sars-Covid-2 Vaccination 09/02/2019, 10/01/2019, 05/23/2020   PFIZER(Purple Top)SARS-COV-2 Vaccination 01/25/2021     ROS: See pertinent positives and negatives per HPI.  Past Medical History:  Diagnosis Date   Current long-term use of anticoagulant medication with history of deep venous thrombosis (DVT)    History of DVT (deep vein thrombosis)    Primary osteoarthritis     Past Surgical History:  Procedure Laterality Date   KNEE ARTHROSCOPY Left 06/12/2021   Procedure: LEFT KNEE ARTHROSCOPY WITH DEBRIDEMENT AND REPAIR OF A POSTERIOR MEDIAL ROOT TEAR AND ABRASION CONDOPLASTY;  Surgeon: Corky Mull, MD;  Location: [facility] ORS;  Service: Orthopedics;  Laterality: Left;   PULMONARY THROMBECTOMY N/A 07/09/2021   Procedure: PULMONARY THROMBECTOMY;  Surgeon: Algernon Huxley, MD;  Location: [facility];  Service: Cardiovascular;  Laterality: N/A;     Current Outpatient Medications:    ELIQUIS 5 MG TABS  tablet, TAKE ONE (1) TABLET BY MOUTH TWO TIMES PER DAY, Disp: 60 tablet, Rfl: 6   molnupiravir EUA (LAGEVRIO) 200 mg CAPS capsule, Take 4 capsules (800 mg total) by mouth 2 (two) times daily for 5 days., Disp: 40 capsule, Rfl: 0   acetaminophen (TYLENOL) 500 MG tablet, Take by mouth. (Patient not taking: Reported on 12/13/2021), Disp: , Rfl:    valACYclovir (VALTREX) 1000 MG tablet, Take 1,000 mg by mouth 3 (three) times daily., Disp: , Rfl:   EXAM:  VITALS per patient if applicable:  GENERAL: alert, oriented, appears well and in no acute distress  HEENT: atraumatic, conjunttiva clear, no obvious abnormalities on inspection of external nose and ears  NECK: normal movements of the head and neck  LUNGS: on inspection no signs of respiratory distress, breathing rate appears normal, no obvious gross SOB, gasping or wheezing  CV: no obvious cyanosis  MS: moves all visible extremities without noticeable abnormality  PSYCH/NEURO: pleasant and cooperative, no obvious depression or anxiety, speech and thought processing grossly intact  ASSESSMENT AND PLAN:  Discussed the following assessment and plan:  COVID-19 - Plan: molnupiravir EUA (LAGEVRIO) 200 mg CAPS capsule 400 mg bid x 5 days  -we discussed possible serious and likely etiologies, options for evaluation and workup, limitations of telemedicine visit vs in person visit, treatment, treatment risks and precautions. Pt is agreeable to treatment via telemedicine at this moment.   I discussed the assessment and treatment plan with the patient. The patient was provided an opportunity to ask questions and all were answered. The patient agreed with  the plan and demonstrated an understanding of the instructions.    Time spent 20 minutes  Delorise Jackson, MD

## 2021-12-19 DIAGNOSIS — Z1211 Encounter for screening for malignant neoplasm of colon: Secondary | ICD-10-CM | POA: Diagnosis not present

## 2021-12-24 DIAGNOSIS — M25662 Stiffness of left knee, not elsewhere classified: Secondary | ICD-10-CM | POA: Diagnosis not present

## 2021-12-26 ENCOUNTER — Encounter: Payer: Self-pay | Admitting: Internal Medicine

## 2021-12-26 LAB — COLOGUARD: COLOGUARD: POSITIVE — AB

## 2021-12-26 NOTE — Addendum Note (Signed)
Addended by: [name] on: 12/26/2021 05:35 PM   Modules accepted: Orders

## 2021-12-27 ENCOUNTER — Telehealth: Payer: Self-pay

## 2021-12-27 ENCOUNTER — Other Ambulatory Visit: Payer: Self-pay

## 2021-12-27 DIAGNOSIS — R195 Other fecal abnormalities: Secondary | ICD-10-CM

## 2021-12-27 MED ORDER — PEG 3350-KCL-NA BICARB-NACL 420 G PO SOLR: 4000.0000 mL | Freq: Once | ORAL | 0 refills | Status: AC

## 2021-12-27 NOTE — Telephone Encounter (Signed)
Gastroenterology Pre-Procedure Review  Request Date: 06/23 Requesting Physician: Dr. Vicente Males  PATIENT REVIEW QUESTIONS: The patient responded to the following health history questions as indicated:    1. Are you having any GI issues? no 2. Do you have a personal history of Polyps? no 3. Do you have a family history of Colon Cancer or Polyps? no 4. Diabetes Mellitus? no 5. Joint replacements in the past 12 months? Meniscal tear  11/22 Blood clots 12/18 6. Major health problems in the past 3 months?yes (Shingles and COVID  12/13/21) 7. Any artificial heart valves, MVP, or defibrillator?no    MEDICATIONS & ALLERGIES:    Patient reports the following regarding taking any anticoagulation/antiplatelet therapy:   Plavix, Coumadin, Eliquis, Xarelto, Lovenox, Pradaxa, Brilinta, or Effient? yes (Eliquis Dr. Lucky Cowboy) Aspirin? no  Patient confirms/reports the following medications:  Current Outpatient Medications  Medication Sig Dispense Refill   acetaminophen (TYLENOL) 500 MG tablet Take by mouth. (Patient not taking: Reported on 12/13/2021)     ELIQUIS 5 MG TABS tablet TAKE ONE (1) TABLET BY MOUTH TWO TIMES PER DAY 60 tablet 6   valACYclovir (VALTREX) 1000 MG tablet Take 1,000 mg by mouth 3 (three) times daily.     No current facility-administered medications for this visit.    Patient confirms/reports the following allergies:  Allergies  Allergen Reactions   Penicillins     Unknown childhood reaction    Shrimp Extract Allergy Skin Test Swelling    Shrimp Shrimp    Shrimp Flavor Swelling    Shrimp    No orders of the defined types were placed in this encounter.   AUTHORIZATION INFORMATION Primary Insurance: 1D#: Group #:  Secondary Insurance: 1D#: Group #:  SCHEDULE INFORMATION: Date:  Time: Location:

## 2022-01-02 ENCOUNTER — Other Ambulatory Visit (HOSPITAL_COMMUNITY)
Admission: RE | Admit: 2022-01-02 | Discharge: 2022-01-02 | Disposition: A | Discharge status: Home / Self Care | Payer: Medicare Other | Source: Ambulatory Visit | Attending: Internal Medicine | Admitting: Internal Medicine

## 2022-01-02 ENCOUNTER — Ambulatory Visit (INDEPENDENT_AMBULATORY_CARE_PROVIDER_SITE_OTHER): Payer: Medicare Other | Admitting: Internal Medicine

## 2022-01-02 ENCOUNTER — Encounter: Payer: Self-pay | Admitting: Internal Medicine

## 2022-01-02 VITALS — BP 150/92 | HR 78 | Temp 98.1°F | Ht 67.0 in | Wt 222.2 lb

## 2022-01-02 DIAGNOSIS — I700 Atherosclerosis of aorta: Secondary | ICD-10-CM

## 2022-01-02 DIAGNOSIS — Z124 Encounter for screening for malignant neoplasm of cervix: Secondary | ICD-10-CM

## 2022-01-02 DIAGNOSIS — E785 Hyperlipidemia, unspecified: Secondary | ICD-10-CM | POA: Diagnosis not present

## 2022-01-02 DIAGNOSIS — E1169 Type 2 diabetes mellitus with other specified complication: Secondary | ICD-10-CM

## 2022-01-02 DIAGNOSIS — Z86711 Personal history of pulmonary embolism: Secondary | ICD-10-CM | POA: Diagnosis not present

## 2022-01-02 DIAGNOSIS — Z780 Asymptomatic menopausal state: Secondary | ICD-10-CM

## 2022-01-02 DIAGNOSIS — Z1151 Encounter for screening for human papillomavirus (HPV): Secondary | ICD-10-CM | POA: Diagnosis not present

## 2022-01-02 DIAGNOSIS — Z0000 Encounter for general adult medical examination without abnormal findings: Secondary | ICD-10-CM | POA: Diagnosis not present

## 2022-01-02 DIAGNOSIS — Z1231 Encounter for screening mammogram for malignant neoplasm of breast: Secondary | ICD-10-CM

## 2022-01-02 MED ORDER — ROSUVASTATIN CALCIUM 10 MG PO TABS: 10.0000 mg | ORAL_TABLET | Freq: Every day | ORAL | 1 refills | Status: DC

## 2022-01-02 MED ORDER — TETANUS-DIPHTH-ACELL PERTUSSIS 5-2.5-18.5 LF-MCG/0.5 IM SUSP: 0.5000 mL | Freq: Once | INTRAMUSCULAR | 0 refills | Status: AC

## 2022-01-02 MED ORDER — ENOXAPARIN SODIUM 100 MG/ML IJ SOSY: 100.0000 mg | PREFILLED_SYRINGE | Freq: Two times a day (BID) | INTRAMUSCULAR | 0 refills | Status: DC

## 2022-01-02 MED ORDER — ZOSTER VAC RECOMB ADJUVANTED 50 MCG/0.5ML IM SUSR
0.5000 mL | Freq: Once | INTRAMUSCULAR | 1 refills | Status: AC
Start: 2022-01-02 — End: 2022-01-02

## 2022-01-02 NOTE — Progress Notes (Signed)
Patient ID: Sandra Phillips, female    DOB: August 15, 1950  Age: 71 y.o. MRN: [id number]   The patient is here for annual   preventive  examination and management of other chronic and acute problems.   The risk factors are reflected in the social history.  The roster of all physicians providing medical care to patient - is listed in the Snapshot section of the chart.  Activities of daily living:  The patient is 100% independent in all ADLs: dressing, toileting, feeding as well as independent mobility  Home safety : The patient has smoke detectors in the home. They wear seatbelts.  There are no firearms at home. There is no violence in the home.   There is no risks for hepatitis, STDs or HIV. There is no   history of blood transfusion. They have no travel history to infectious disease endemic areas of the world.  The patient has seen their dentist in the last six month. They have seen their eye doctor in the last year. They admit to slight hearing difficulty with regard to whispered voices and some television programs.  They have deferred audiologic testing in the last year.  They do not  have excessive sun exposure. Discussed the need for sun protection: hats, long sleeves and use of sunscreen if there is significant sun exposure.   Diet: the importance of a healthy diet is discussed. They do have a healthy diet.  The benefits of regular aerobic exercise were discussed. She walks 4 times per week ,  20 minutes.   Depression screen: there are no signs or vegative symptoms of depression- irritability, change in appetite, anhedonia, sadness/tearfullness.  Cognitive assessment: the patient manages all their financial and personal affairs and is actively engaged. They could relate day,date,year and events; recalled 2/3 objects at 3 minutes; performed clock-face test normally.  The following portions of the patient's history were reviewed and updated as appropriate: allergies, current medications, past  family history, past medical history,  past surgical history, past social history  and problem list.  Visual acuity was not assessed per patient preference since she has regular follow up with her ophthalmologist. Hearing and body mass index were assessed and reviewed.   During the course of the visit the patient was educated and counseled about appropriate screening and preventive services including : fall prevention , diabetes screening, nutrition counseling, colorectal cancer screening, and recommended immunizations.    CC: The primary encounter diagnosis was Postmenopausal estrogen deficiency. Diagnoses of Breast cancer screening by mammogram, Hyperlipidemia associated with type 2 diabetes mellitus ([place]), Cervical cancer screening, History of pulmonary embolism, Aortic atherosclerosis ([place]), and Encounter for preventive health examination were also pertinent to this visit.  Positive COVID test:  discussed use of Lovenox to bridge suspension of Xarelto , which she has been taking for > 1 yr for postoperative saddle PE.  She denies needle phobia but prefers to have a refresher instruction session   History [place] has a past medical history of COVID-19, Current long-term use of anticoagulant medication with history of deep venous thrombosis (DVT), History of DVT (deep vein thrombosis), and Primary osteoarthritis.   She has a past surgical history that includes Knee arthroscopy (Left, 06/12/2021) and PULMONARY THROMBECTOMY (N/A, 07/09/2021).   Her family history includes Cancer in her mother; Rheum arthritis in her sister.She reports that she has never smoked. She has never used smokeless tobacco. She reports current alcohol use of about 1.0 standard drink of alcohol per week. She reports that she  does not use drugs.  Outpatient Medications Prior to Visit  Medication Sig Dispense Refill   acetaminophen (TYLENOL) 500 MG tablet Take by mouth.     ELIQUIS 5 MG TABS tablet TAKE ONE (1) TABLET BY MOUTH  TWO TIMES PER DAY 60 tablet 6   valACYclovir (VALTREX) 1000 MG tablet Take 1,000 mg by mouth 3 (three) times daily.     No facility-administered medications prior to visit.    Review of Systems  Patient denies headache, fevers, malaise, unintentional weight loss, skin rash, eye pain, sinus congestion and sinus pain, sore throat, dysphagia,  hemoptysis , cough, dyspnea, wheezing, chest pain, palpitations, orthopnea, edema, abdominal pain, nausea, melena, diarrhea, constipation, flank pain, dysuria, hematuria, urinary  Frequency, nocturia, numbness, tingling, seizures,  Focal weakness, Loss of consciousness,  Tremor, insomnia, depression, anxiety, and suicidal ideation.     Objective:  BP (!) 150/92 (BP Location: Left Arm, Patient Position: Sitting, Cuff Size: Large)   Pulse 78   Temp 98.1 F (36.7 C) (Oral)   Ht '5\' 7"'$  (1.702 m)   Wt 222 lb 3.2 oz (100.8 kg)   SpO2 96%   BMI 34.80 kg/m   Physical Exam   General Appearance:    Alert, cooperative, no distress, appears stated age  Head:    Normocephalic, without obvious abnormality, atraumatic  Eyes:    PERRL, conjunctiva/corneas clear, EOM's intact, fundi    benign, both eyes  Ears:    Normal TM's and external ear canals, both ears  Nose:   Nares normal, septum midline, mucosa normal, no drainage    or sinus tenderness  Throat:   Lips, mucosa, and tongue normal; teeth and gums normal  Neck:   Supple, symmetrical, trachea midline, no adenopathy;    thyroid:  no enlargement/tenderness/nodules; no carotid   bruit or JVD  Back:     Symmetric, no curvature, ROM normal, no CVA tenderness  Lungs:     Clear to auscultation bilaterally, respirations unlabored  Chest Wall:    No tenderness or deformity   Heart:    Regular rate and rhythm, S1 and S2 normal, no murmur, rub   or gallop  Breast Exam:    No tenderness, masses, or nipple abnormality  Abdomen:     Soft, non-tender, bowel sounds active all four quadrants,    no masses, no  organomegaly  Genitalia:    Pelvic: cervix atrophic in appearance, fr external genitalia normal, no adnexal masses or tenderness, no cervical motion tenderness, rectovaginal septum normal, uterus normal size, shape, and consistency and vagina atrophic  without discharge  Extremities:   Extremities normal, atraumatic, no cyanosis or edema  Pulses:   2+ and symmetric all extremities  Skin:   Skin color, texture, turgor normal, no rashes or lesions  Lymph nodes:   Cervical, supraclavicular, and axillary nodes normal  Neurologic:   CNII-XII intact, normal strength, sensation and reflexes    throughout     Assessment & Plan:   Problem List Items Addressed This Visit     History of pulmonary embolism    She will continue lifelong anticoagulation  Given the extensive natureo of her postoperative PE in Dec 2022.  Given her need for colonscpy  I am bridging her with Lovenox during the suspension of Eliquis      Encounter for preventive health examination    PAP smear was done due to being lost to follow up  age appropriate education and counseling updated, referrals for preventative services and immunizations addressed, dietary  and smoking counseling addressed, most recent labs reviewed.  I have personally reviewed and have noted:   1) the patient's medical and social history 2) The pt's use of alcohol, tobacco, and illicit drugs 3) The patient's current medications and supplements 4) Functional ability including ADL's, fall risk, home safety risk, hearing and visual impairment 5) Diet and physical activities 6) Evidence for depression or mood disorder 7) The patient's height, weight, and BMI have been recorded in the chart  I have made referrals, and provided counseling and education based on review of the above      Aortic atherosclerosis ([place])    Mild placque noted on Dec 2022 CTA during  admission for PE.  She ha sagreed to a trial of high potency statin therapy.       Relevant  Medications   enoxaparin (LOVENOX) 100 MG/ML injection   rosuvastatin (CRESTOR) 10 MG tablet   Other Visit Diagnoses     Postmenopausal estrogen deficiency    -  Primary   Relevant Orders   DG Bone Density   Breast cancer screening by mammogram       Relevant Orders   MM 3D SCREEN BREAST BILATERAL   Hyperlipidemia associated with type 2 diabetes mellitus ([place])       Relevant Medications   enoxaparin (LOVENOX) 100 MG/ML injection   rosuvastatin (CRESTOR) 10 MG tablet   Other Relevant Orders   Comprehensive metabolic panel   Cervical cancer screening       Relevant Orders   Cytology - PAP( [facility])       I have discontinued Sydnie L. Hannis's valACYclovir. I am also having her start on Tdap, Zoster Vaccine Adjuvanted, enoxaparin, and rosuvastatin. Additionally, I am having her maintain her Eliquis and acetaminophen.  Meds ordered this encounter  Medications   Tdap (BOOSTRIX) 5-2.5-18.5 LF-MCG/0.5 injection    Sig: Inject 0.5 mLs into the muscle once for 1 dose.    Dispense:  0.5 mL    Refill:  0   Zoster Vaccine Adjuvanted [facility]) injection    Sig: Inject 0.5 mLs into the muscle once for 1 dose.    Dispense:  0.5 mL    Refill:  1   enoxaparin (LOVENOX) 100 MG/ML injection    Sig: Inject 1 mL (100 mg total) into the skin every 12 (twelve) hours. STARTING THE AM OF JUN 20    Dispense:  6 mL    Refill:  0    6 SYRINGES NEEDED   rosuvastatin (CRESTOR) 10 MG tablet    Sig: Take 1 tablet (10 mg total) by mouth daily.    Dispense:  30 tablet    Refill:  1    Medications Discontinued During This Encounter  Medication Reason   valACYclovir (VALTREX) 1000 MG tablet     Follow-up: Return in about 3 months (around 04/04/2022).   [name], MD

## 2022-01-02 NOTE — Patient Instructions (Addendum)
VACCINES DUE: The Tdap (tetanus-diphtheria-whooping cough vaccine ) and Shingrix vaccines are now  COVERED BY MEDICARE if you get them at your pharmacy as of  January 1.   You can expect 24 hours of flu like symptoms after receiving  each dose of the shingles vaccine, so plan accordingly.      Wait 3 months minimum from your shingles episode to get the first shingrix vaccine  2) ANTICOAGULATION  PRIOR TO COLONOSCOPY JUN 23   Stop the Eliquis  after your  June 19th dose .  Start the lovenox injections on June 20th and take one dose (shot) every 12 hours on June 20,  21 and 22. (6 doses total)  NO ELIQUIS OR LOVENOX ON JUNE 23RD  IF NO POLYPS ARE REMOVED,  RESUME ELIQUIS ON JUNE 24.  IF POLYPS ARE REMOVED,  ASK DR WOHL WHEN HE WOULD LIKE YOU TO RESUME ELIQUIS    3)  TREATING AORTIC ATHEROSCLEROSIS:  STARTING CRESTOR FOR MANAGEMENT OF AORTIC ATHEROSCLEROSIS . ONE TABLET DAILY AT NIGHT. RETURN FOR LIVER TESTS AFTER 3 WEEKS OF THERAPY  4) RN VISIT BRING YOUR HOME BP MACHINE TO CHECK BP   5) Your annual mammogram AND  DEXA  SCAN have been ordered.  Please call [facility] to call to make your appointments  .  The phone number for [facility] is  [phone]

## 2022-01-03 ENCOUNTER — Telehealth: Payer: Self-pay

## 2022-01-03 NOTE — Telephone Encounter (Signed)
Blood thinner advice received from Dr. Lucky Cowboy.  Dr. Lucky Cowboy has advised to hold Eliquis 2 days prior to colonoscopy restart 1 day after procedure.    These instructions were left on patients voicemail.  Thanks, [place], [place]

## 2022-01-05 DIAGNOSIS — Z0000 Encounter for general adult medical examination without abnormal findings: Secondary | ICD-10-CM | POA: Insufficient documentation

## 2022-01-05 NOTE — Assessment & Plan Note (Addendum)
She will continue lifelong anticoagulation  Given the extensive natureo of her postoperative PE in Dec 2022.  Given her need for colonscpy  I am bridging her with Lovenox during the suspension of Eliquis

## 2022-01-05 NOTE — Assessment & Plan Note (Signed)
Mild placque noted on Dec 2022 CTA during  admission for PE.  She ha sagreed to a trial of high potency statin therapy.

## 2022-01-05 NOTE — Assessment & Plan Note (Signed)
PAP smear was done due to being lost to follow up  age appropriate education and counseling updated, referrals for preventative services and immunizations addressed, dietary and smoking counseling addressed, most recent labs reviewed.  I have personally reviewed and have noted:   1) the patient's medical and social history 2) The pt's use of alcohol, tobacco, and illicit drugs 3) The patient's current medications and supplements 4) Functional ability including ADL's, fall risk, home safety risk, hearing and visual impairment 5) Diet and physical activities 6) Evidence for depression or mood disorder 7) The patient's height, weight, and BMI have been recorded in the chart  I have made referrals, and provided counseling and education based on review of the above

## 2022-01-07 DIAGNOSIS — M25662 Stiffness of left knee, not elsewhere classified: Secondary | ICD-10-CM | POA: Diagnosis not present

## 2022-01-07 LAB — CYTOLOGY - PAP
Comment: NEGATIVE
Diagnosis: HIGH — AB
High risk HPV: NEGATIVE

## 2022-01-09 ENCOUNTER — Telehealth: Payer: Self-pay | Admitting: Internal Medicine

## 2022-01-09 DIAGNOSIS — R87610 Atypical squamous cells of undetermined significance on cytologic smear of cervix (ASC-US): Secondary | ICD-10-CM

## 2022-01-09 NOTE — Telephone Encounter (Signed)
Pt called stating she could not respond to the mychart message through [place] but she does not have a gynecologist currently and wants to know who the provider recommends for a female

## 2022-01-09 NOTE — Telephone Encounter (Signed)
I like Servando Salina,  MD in [place],  and will refer to her

## 2022-01-10 ENCOUNTER — Encounter: Payer: Self-pay | Admitting: Gastroenterology

## 2022-01-10 NOTE — Telephone Encounter (Signed)
Spoke with pt and informed her of where Dr. Derrel Nip referred her to. Pt gave a verbal understanding.

## 2022-01-11 ENCOUNTER — Ambulatory Visit: Payer: Medicare Other | Admitting: Anesthesiology

## 2022-01-11 ENCOUNTER — Ambulatory Visit
Admission: RE | Admit: 2022-01-11 | Discharge: 2022-01-11 | Disposition: A | Discharge status: Home / Self Care | Payer: Medicare Other | Attending: Gastroenterology | Admitting: Gastroenterology

## 2022-01-11 ENCOUNTER — Encounter
Admission: RE | Disposition: A | Discharge status: Home / Self Care | Payer: Self-pay | Source: Home / Self Care | Attending: Gastroenterology

## 2022-01-11 DIAGNOSIS — K635 Polyp of colon: Secondary | ICD-10-CM

## 2022-01-11 DIAGNOSIS — Z86718 Personal history of other venous thrombosis and embolism: Secondary | ICD-10-CM | POA: Diagnosis not present

## 2022-01-11 DIAGNOSIS — R195 Other fecal abnormalities: Secondary | ICD-10-CM | POA: Insufficient documentation

## 2022-01-11 DIAGNOSIS — Z7901 Long term (current) use of anticoagulants: Secondary | ICD-10-CM | POA: Diagnosis not present

## 2022-01-11 DIAGNOSIS — D126 Benign neoplasm of colon, unspecified: Secondary | ICD-10-CM

## 2022-01-11 DIAGNOSIS — D120 Benign neoplasm of cecum: Secondary | ICD-10-CM | POA: Insufficient documentation

## 2022-01-11 DIAGNOSIS — D122 Benign neoplasm of ascending colon: Secondary | ICD-10-CM | POA: Insufficient documentation

## 2022-01-11 DIAGNOSIS — Z1211 Encounter for screening for malignant neoplasm of colon: Secondary | ICD-10-CM | POA: Diagnosis not present

## 2022-01-11 HISTORY — PX: COLONOSCOPY WITH PROPOFOL: SHX5780

## 2022-01-11 SURGERY — COLONOSCOPY WITH PROPOFOL
Anesthesia: General

## 2022-01-11 MED ORDER — PROPOFOL 10 MG/ML IV BOLUS
INTRAVENOUS | Status: DC | PRN
  Administered 2022-01-11: 100 mg via INTRAVENOUS

## 2022-01-11 MED ORDER — PROPOFOL 500 MG/50ML IV EMUL
INTRAVENOUS | Status: DC | PRN
  Administered 2022-01-11: 120 ug/kg/min via INTRAVENOUS

## 2022-01-11 MED ORDER — LIDOCAINE 2% (20 MG/ML) 5 ML SYRINGE
INTRAMUSCULAR | Status: DC | PRN
  Administered 2022-01-11: 20 mg via INTRAVENOUS

## 2022-01-11 MED ORDER — SODIUM CHLORIDE 0.9 % IV SOLN: INTRAVENOUS | Status: DC

## 2022-01-14 ENCOUNTER — Encounter: Payer: Self-pay | Admitting: Gastroenterology

## 2022-01-15 LAB — SURGICAL PATHOLOGY

## 2022-01-16 ENCOUNTER — Encounter: Payer: Self-pay | Admitting: Gastroenterology

## 2022-01-16 ENCOUNTER — Ambulatory Visit (INDEPENDENT_AMBULATORY_CARE_PROVIDER_SITE_OTHER): Payer: Medicare Other

## 2022-01-16 VITALS — Ht 67.0 in | Wt 223.0 lb

## 2022-01-16 DIAGNOSIS — Z0000 Encounter for general adult medical examination without abnormal findings: Secondary | ICD-10-CM | POA: Diagnosis not present

## 2022-01-16 NOTE — Progress Notes (Addendum)
Subjective:   Sandra Phillips is a 71 y.o. female who presents for Medicare Annual (Initial) preventive examination.  Review of Systems    No ROS.  Medicare Wellness Virtual Visit.  Visual/audio telehealth visit, UTA vital signs.   See social history for additional risk factors.   Cardiac Risk Factors include: advanced age (>58mn, >>34women)     Objective:    Today's Vitals   01/16/22 1436  Weight: 223 lb (101.2 kg)  Height: '5\' 7"'$  (1.702 m)   Body mass index is 34.93 kg/m.     01/16/2022    2:44 PM 01/11/2022   11:54 AM 08/14/2021    1:37 PM 07/07/2021    4:09 PM 06/12/2021    1:57 PM  Advanced Directives  Does Patient Have a Medical Advance Directive? Yes Yes No No Yes  Type of [place]amed[place]of [place] will    [place] will  Does patient want to make changes to medical advance directive? No - Patient declined    No - Patient declined  Copy of [place] Chart? No - copy requested    No - copy requested  Would patient like information on creating a medical advance directive?   No - Patient declined No - Patient declined     Current Medications (verified) Outpatient Encounter Medications as of 01/16/2022  Medication Sig   acetaminophen (TYLENOL) 500 MG tablet Take by mouth.   ELIQUIS 5 MG TABS tablet TAKE ONE (1) TABLET BY MOUTH TWO TIMES PER DAY   enoxaparin (LOVENOX) 100 MG/ML injection Inject 1 mL (100 mg total) into the skin every 12 (twelve) hours. STARTING THE AM OF JUN 20   rosuvastatin (CRESTOR) 10 MG tablet Take 1 tablet (10 mg total) by mouth daily.   No facility-administered encounter medications on file as of 01/16/2022.    Allergies (verified) Penicillins, Shrimp extract allergy skin test, and Shrimp flavor   History: Past Medical History:  Diagnosis Date   COVID-19    12/13/21   Current long-term use of anticoagulant medication with history of deep venous thrombosis (DVT)     History of DVT (deep vein thrombosis)    Primary osteoarthritis    Past Surgical History:  Procedure Laterality Date   COLONOSCOPY WITH PROPOFOL N/A 01/11/2022   Procedure: COLONOSCOPY WITH PROPOFOL;  Surgeon: AJonathon Bellows MD;  [facility];  Service: Gastroenterology;  Laterality: N/A;   KNEE ARTHROSCOPY Left 06/12/2021   Procedure: LEFT KNEE ARTHROSCOPY WITH DEBRIDEMENT AND REPAIR OF A POSTERIOR MEDIAL ROOT TEAR AND ABRASION CONDOPLASTY;  Surgeon: PCorky Mull MD;  Location: [facility] ORS;  Service: Orthopedics;  Laterality: Left;   PULMONARY THROMBECTOMY N/A 07/09/2021   Procedure: PULMONARY THROMBECTOMY;  Surgeon: DAlgernon Huxley MD;  [facility];  Service: Cardiovascular;  Laterality: N/A;   Family History  Problem Relation Age of Onset   Cancer Mother    Rheum arthritis Sister    Social History   Socioeconomic History   Marital status: Single    Spouse name: Not on file   Number of children: Not on file   Years of education: Not on file   Highest education level: Not on file  Occupational History   Not on file  Tobacco Use   Smoking status: Never   Smokeless tobacco: Never  Vaping Use   Vaping Use: Never used  Substance and Sexual Activity   Alcohol use: Yes    Alcohol/week: 1.0  standard drink of alcohol    Types: 1 Glasses of wine per week    Comment: a glass at dinner   Drug use: Never   Sexual activity: Not Currently  Other Topics Concern   Not on file  Social History Narrative   Not on file   Social Determinants of Health   Financial Resource Strain: Low Risk  (01/16/2022)   Overall Financial Resource Strain (CARDIA)    Difficulty of Paying Living Expenses: Not hard at all  Food Insecurity: No Food Insecurity (01/16/2022)   Hunger Vital Sign    Worried About Running Out of Food in the Last Year: Never true    Ran Out of Food in the Last Year: Never true  Transportation Needs: No Transportation Needs (01/16/2022)   PRAPARE -  Hydrologist (Medical): No    Lack of Transportation (Non-Medical): No  Physical Activity: Sufficiently Active (01/16/2022)   Exercise Vital Sign    Days of Exercise per Week: 5 days    Minutes of Exercise per Session: 30 min  Stress: No Stress Concern Present (01/16/2022)   [place]    Feeling of Stress : Not at all  Social Connections: Unknown (01/16/2022)   Social Connection and Isolation Panel [NHANES]    Frequency of Communication with Friends and Family: More than three times a week    Frequency of Social Gatherings with Friends and Family: More than three times a week    Attends Religious Services: More than 4 times per year    Active Member of [employer] or Organizations: Not on file    Attends Archivist Meetings: Not on file    Marital Status: Not on file    Tobacco Counseling Counseling given: Not Answered   Clinical Intake:  Pre-visit preparation completed: Yes        Diabetes: No  How often do you need to have someone help you when you read instructions, pamphlets, or other written materials from your doctor or pharmacy?: 1 - Never Interpreter Needed?: No      Activities of Daily Living    01/16/2022    2:45 PM 07/08/2021   11:00 PM  In your present state of health, do you have any difficulty performing the following activities:  Hearing? 0 0  Vision? 0 0  Difficulty concentrating or making decisions? 0 0  Walking or climbing stairs? 0 1  Comment Paces self when walking/climbing stairs   Dressing or bathing? 0 1  Doing errands, shopping? 0   Preparing Food and eating ? N   Using the Toilet? N   In the past six months, have you accidently leaked urine? N   Do you have problems with loss of bowel control? N   Managing your Medications? N   Managing your Finances? N   Housekeeping or managing your Housekeeping? N     Patient Care Team: [name], MD as PCP - General (Internal Medicine)  Indicate any recent Medical Services you may have received from other than [facility] providers in the past year (date may be approximate).     Assessment:   This is a routine wellness examination for Sandra Phillips.  Virtual Visit via Telephone Note  I connected with  Sandra Phillips on 01/16/22 at  2:30 PM EDT by telephone and verified that I am speaking with the correct person using two identifiers.  Persons participating in the virtual visit: patient/Nurse  Health Advisor   I discussed the limitations of performing an evaluation and management service by telehealth. We continued and completed visit with audio only. Some vital signs may be absent or patient reported.   Hearing/Vision screen Hearing Screening - Comments:: Patient is able to hear conversational tones without difficulty.  No issues reported. Vision Screening - Comments:: Followed by [facility], [place], Dr. Carmel [place] Wears corrective lenses They have seen their ophthalmologist in the last 12 months.    Dietary issues and exercise activities discussed: Current Exercise Habits: Home exercise routine, Intensity: Mild   Goals Addressed             This Visit's Progress    Increase physical activity       As tolerated       Depression Screen    01/16/2022    2:42 PM 01/02/2022    2:23 PM 12/13/2021   10:45 AM 12/04/2021    3:13 PM  PHQ 2/9 Scores  PHQ - 2 Score 0 0 0 0    Fall Risk    01/16/2022    2:45 PM 01/02/2022    2:23 PM 12/13/2021   10:45 AM 12/04/2021    3:12 PM  [place] in the past year? 0 0 0 0  Number falls in past yr:   0   Injury with Fall?   0   Risk for fall due to :  No Fall Risks No Fall Risks No Fall Risks  Follow up Falls evaluation completed Falls evaluation completed Falls evaluation completed Falls evaluation completed    [place]: Home free of loose throw rugs in walkways, pet beds, electrical  cords, etc? Yes  Adequate lighting in your home to reduce risk of falls? Yes   ASSISTIVE DEVICES UTILIZED TO PREVENT FALLS: Life alert? No  Use of a cane, walker or w/c? No   TIMED UP AND GO: Was the test performed? No .   Cognitive Function:  Patient is alert and oriented x3.  Enjoys painting and other brain health stimulating activities.      Immunizations Immunization History  Administered Date(s) Administered   [employer] Vaccination 09/02/2019, 10/01/2019, 05/23/2020   PFIZER(Purple Top)SARS-COV-2 Vaccination 01/25/2021   TDAP status: Due, Education has been provided regarding the importance of this vaccine. Advised may receive this vaccine at local pharmacy or Health Dept. Aware to provide a copy of the vaccination record if obtained from local pharmacy or Health Dept. Verbalized acceptance and understanding.  Pneumococcal vaccine status: Due, Education has been provided regarding the importance of this vaccine. Advised may receive this vaccine at local pharmacy or Health Dept. Aware to provide a copy of the vaccination record if obtained from local pharmacy or Health Dept. Verbalized acceptance and understanding.  Shingrix Completed?: No.    Education has been provided regarding the importance of this vaccine. Patient has been advised to call insurance company to determine out of pocket expense if they have not yet received this vaccine. Advised may also receive vaccine at local pharmacy or Health Dept. Verbalized acceptance and understanding.  Screening Tests Health Maintenance  Topic Date Due   MAMMOGRAM  Never done   DEXA SCAN  Never done   COVID-19 Vaccine (5 - Booster for Moderna series) 01/18/2022 (Originally 03/22/2021)   URINE MICROALBUMIN  02/15/2022 (Originally 05/31/1961)   Pneumonia Vaccine 30+ Years old (1 - PCV) 03/22/2022 (Originally 05/30/2018)   TETANUS/TDAP  03/22/2022 (Originally 05/30/1972)  Zoster Vaccines- Shingrix (1 of 2) 04/18/2022 (Originally  05/30/1972)   INFLUENZA VACCINE  02/19/2022   COLONOSCOPY (Pts 45-71yr Insurance coverage will need to be confirmed)  01/12/2032   Hepatitis C Screening  Completed   HPV VACCINES  Aged Out   Health Maintenance Health Maintenance Due  Topic Date Due   MAMMOGRAM  Never done   DEXA SCAN  Never done   Lung Cancer Screening: (Low Dose CT Chest recommended if Age 69-80years, 30 pack-year currently smoking OR have quit w/in 15years.) does not qualify.   Vision Screening: Recommended annual ophthalmology exams for early detection of glaucoma and other disorders of the eye.  Dental Screening: Recommended annual dental exams for proper oral hygiene  Community Resource Referral / Chronic Care Management: CRR required this visit?  No   CCM required this visit?  No      Plan:   Keep all routine maintenance appointments.   I have personally reviewed and noted the following in the patient's chart:   Medical and social history Use of alcohol, tobacco or illicit drugs  Current medications and supplements including opioid prescriptions.  Functional ability and status Nutritional status Physical activity Advanced directives List of other physicians Hospitalizations, surgeries, and ER visits in previous 12 months Vitals Screenings to include cognitive, depression, and falls Referrals and appointments  In addition, I have reviewed and discussed with patient certain preventive protocols, quality metrics, and best practice recommendations. A written personalized care plan for preventive services as well as general preventive health recommendations were provided to patient.     OBrien-Blaney, Dashun Borre L, LPN   [id number]    I have reviewed the above information and agree with above.   TDeborra Medina MD

## 2022-01-16 NOTE — Patient Instructions (Addendum)
  Ms. Sandra Phillips , Thank you for taking time to come for your Medicare Wellness Visit. I appreciate your ongoing commitment to your health goals. Please review the following plan we discussed and let me know if I can assist you in the future.   These are the goals we discussed:  Goals      Increase physical activity     As tolerated        This is a list of the screening recommended for you and due dates:  Health Maintenance  Topic Date Due   Mammogram  Never done   DEXA scan (bone density measurement)  Never done   COVID-19 Vaccine (5 - Booster for Moderna series) 01/18/2022*   Urine Protein Check  02/15/2022*   Pneumonia Vaccine (1 - PCV) 03/22/2022*   Tetanus Vaccine  03/22/2022*   Zoster (Shingles) Vaccine (1 of 2) 04/18/2022*   Flu Shot  02/19/2022   Colon Cancer Screening  01/12/2032   Hepatitis C Screening: USPSTF Recommendation to screen - Ages 18-79 yo.  Completed   HPV Vaccine  Aged Out  *Topic was postponed. The date shown is not the original due date.

## 2022-01-17 NOTE — Telephone Encounter (Signed)
LMTCB. Need to let the pt know that the referral was faxed to [facility] in [place] on 01/10/2022. Advise pt that she can give them a call to see about getting an appt scheduled at [phone].

## 2022-01-17 NOTE — Telephone Encounter (Signed)
Patient states Dr. Deborra Medina referred her to an OB/GYN in [place] and Lyndel Safe, [place], asked patient to let her know if she has not heard from anyone at the OB/GYN office.  Patient states that she has not heard from the OB/GYN office.

## 2022-01-24 NOTE — Telephone Encounter (Signed)
LVM Informing the patient to call [facility] and I gave the number to call and schedule. Kimberlye Dilger,cma

## 2022-02-05 ENCOUNTER — Other Ambulatory Visit: Payer: Self-pay | Admitting: Internal Medicine

## 2022-02-11 ENCOUNTER — Ambulatory Visit (INDEPENDENT_AMBULATORY_CARE_PROVIDER_SITE_OTHER): Payer: Medicare Other

## 2022-02-11 DIAGNOSIS — E1169 Type 2 diabetes mellitus with other specified complication: Secondary | ICD-10-CM | POA: Diagnosis not present

## 2022-02-11 DIAGNOSIS — R030 Elevated blood-pressure reading, without diagnosis of hypertension: Secondary | ICD-10-CM

## 2022-02-11 DIAGNOSIS — E785 Hyperlipidemia, unspecified: Secondary | ICD-10-CM

## 2022-02-11 NOTE — Progress Notes (Signed)
Patient here for nurse visit BP check per order from  last OV w/PCP on 01/02/22. Marland Kitchen   Patient reports compliance with prescribed BP medications: Pt currently not on any hypertensive medication.   Last dose of BP medication: No hypertensive medications  BP Readings from Last 3 Encounters:  02/11/22 130/80  01/11/22 109/69  01/02/22 (!) 150/92   Pulse Readings from Last 3 Encounters:  02/11/22 80  01/11/22 63  01/02/22 78  Pt brought in her own BP monitor to compare with our readings: First thing this morning '@home'$  her readings were 119/80 & 122/81   In ofc with monitor: L Arm:164/108, PR:76 Waited 10 mins before rechecking BP manually & reading as followed: L Arm:130/80, PR:80   Informed pt that note would be sent to PCP for review & we would reach out to pt with further instructions regarding next steps. Pt was also sent to lab for blood work.   Patient verbalized understanding of instructions.   Marcelyn Ditty, [place]

## 2022-02-11 NOTE — Progress Notes (Deleted)
Patient here for nurse visit BP check per order from last OV w/PCP on 01/02/22.   Patient reports compliance with prescribed BP medications: Pt currently not on any hypertensive medication.  Last dose of BP medication:   BP Readings from Last 3 Encounters:  01/11/22 109/69  01/02/22 (!) 150/92  12/05/21 (!) 150/78   Pulse Readings from Last 3 Encounters:  01/11/22 63  01/02/22 78  12/05/21 80  Pt brought in her own BP monitor to compare with our readings: First thing this morning '@home'$  her readings were 119/80 & 122/81  In ofc: L Arm:164/108, PR:76 Waited 10 mins before rechecking BP & reading as followed: L Arm:130/80, PR:80  Informed pt that note would be sent to PCP for review & we would reach out to pt with further instructions regarding next steps. Pt was also sent to lab for blood work.   Patient verbalized understanding of instructions.   Marcelyn Ditty, [place]

## 2022-02-12 LAB — COMPREHENSIVE METABOLIC PANEL
ALT: 17 U/L (ref 0–35)
AST: 17 U/L (ref 0–37)
Albumin: 4.3 g/dL (ref 3.5–5.2)
Alkaline Phosphatase: 82 U/L (ref 39–117)
BUN: 13 mg/dL (ref 6–23)
CO2: 24 mEq/L (ref 19–32)
Calcium: 9.5 mg/dL (ref 8.4–10.5)
Chloride: 105 mEq/L (ref 96–112)
Creatinine, Ser: 0.8 mg/dL (ref 0.40–1.20)
GFR: 75.56 mL/min (ref >60.00)
Glucose, Bld: 80 mg/dL (ref 70–99)
Potassium: 3.9 mEq/L (ref 3.5–5.1)
Sodium: 139 mEq/L (ref 135–145)
Total Bilirubin: 0.5 mg/dL (ref 0.2–1.2)
Total Protein: 6.9 g/dL (ref 6.0–8.3)

## 2022-02-19 DIAGNOSIS — R87611 Atypical squamous cells cannot exclude high grade squamous intraepithelial lesion on cytologic smear of cervix (ASC-H): Secondary | ICD-10-CM | POA: Diagnosis not present

## 2022-03-04 ENCOUNTER — Other Ambulatory Visit: Payer: Self-pay

## 2022-03-04 MED ORDER — ROSUVASTATIN CALCIUM 10 MG PO TABS: 10.0000 mg | ORAL_TABLET | Freq: Every day | ORAL | 1 refills | Status: DC

## 2022-03-05 ENCOUNTER — Other Ambulatory Visit: Payer: Self-pay | Admitting: *Deleted

## 2022-03-05 NOTE — Patient Outreach (Signed)
  Care Coordination   Initial Visit Note   03/05/2022 Name: Sandra Phillips MRN: [id number] DOB: 1950/11/25  Sandra Phillips is a 71 y.o. year old female who sees Derrel Nip, Aris Everts, MD for primary care. I spoke with  Nelida Gores by phone today  What matters to the patients health and wellness today?  I DON'T NEED THE SERVICES NOW BUT WOULD LIKE TO HAVE YOUR CONTACT NUMBER FOR FUTURE REFERENCE. NP PROVIDED PHONE NUMBER.   SDOH assessments and interventions completed:  No   Care Coordination Interventions Activated:  No  Care Coordination Interventions:  No, not indicated   Follow up plan: No further intervention required.   Encounter Outcome:  Pt. Refused   Roddie Riegler C. Myrtie Neither, MSN, [facility] Gerontological Nurse Practitioner [facility] Care Management [phone]

## 2022-03-11 ENCOUNTER — Other Ambulatory Visit: Payer: Self-pay | Admitting: Obstetrics and Gynecology

## 2022-03-11 ENCOUNTER — Telehealth: Payer: Self-pay | Admitting: Internal Medicine

## 2022-03-11 DIAGNOSIS — N938 Other specified abnormal uterine and vaginal bleeding: Secondary | ICD-10-CM

## 2022-03-11 NOTE — Telephone Encounter (Signed)
Patient called and said that she saw Dr Cousin's today. Dr Maudry Diego believes she is post menopausal and will be doing a biopsy and to stop the bleeding.

## 2022-03-11 NOTE — Telephone Encounter (Signed)
FYI

## 2022-03-12 ENCOUNTER — Ambulatory Visit
Admission: RE | Admit: 2022-03-12 | Discharge: 2022-03-12 | Disposition: A | Discharge status: Home / Self Care | Payer: Medicare Other | Source: Ambulatory Visit | Attending: Obstetrics and Gynecology | Admitting: Obstetrics and Gynecology

## 2022-03-12 DIAGNOSIS — N938 Other specified abnormal uterine and vaginal bleeding: Secondary | ICD-10-CM

## 2022-03-12 DIAGNOSIS — N950 Postmenopausal bleeding: Secondary | ICD-10-CM | POA: Diagnosis not present

## 2022-03-13 ENCOUNTER — Encounter: Payer: Self-pay | Admitting: Internal Medicine

## 2022-03-13 DIAGNOSIS — C541 Malignant neoplasm of endometrium: Secondary | ICD-10-CM | POA: Insufficient documentation

## 2022-03-13 DIAGNOSIS — N9489 Other specified conditions associated with female genital organs and menstrual cycle: Secondary | ICD-10-CM | POA: Insufficient documentation

## 2022-03-19 ENCOUNTER — Other Ambulatory Visit (INDEPENDENT_AMBULATORY_CARE_PROVIDER_SITE_OTHER): Payer: Self-pay | Admitting: Nurse Practitioner

## 2022-03-22 HISTORY — PX: ROBOTIC ASSISTED TOTAL HYSTERECTOMY: SHX6085

## 2022-04-03 ENCOUNTER — Ambulatory Visit
Admission: RE | Admit: 2022-04-03 | Discharge: 2022-04-03 | Disposition: A | Discharge status: Home / Self Care | Payer: Medicare Other | Source: Ambulatory Visit | Attending: Internal Medicine | Admitting: Internal Medicine

## 2022-04-03 DIAGNOSIS — M816 Localized osteoporosis [Lequesne]: Secondary | ICD-10-CM | POA: Diagnosis not present

## 2022-04-03 DIAGNOSIS — Z780 Asymptomatic menopausal state: Secondary | ICD-10-CM | POA: Insufficient documentation

## 2022-04-03 DIAGNOSIS — Z1231 Encounter for screening mammogram for malignant neoplasm of breast: Secondary | ICD-10-CM | POA: Insufficient documentation

## 2022-04-03 DIAGNOSIS — M810 Age-related osteoporosis without current pathological fracture: Secondary | ICD-10-CM | POA: Diagnosis not present

## 2022-04-03 DIAGNOSIS — M8588 Other specified disorders of bone density and structure, other site: Secondary | ICD-10-CM | POA: Diagnosis not present

## 2022-04-05 DIAGNOSIS — M810 Age-related osteoporosis without current pathological fracture: Secondary | ICD-10-CM | POA: Insufficient documentation

## 2022-04-10 ENCOUNTER — Ambulatory Visit (INDEPENDENT_AMBULATORY_CARE_PROVIDER_SITE_OTHER): Payer: Medicare Other | Admitting: Internal Medicine

## 2022-04-10 ENCOUNTER — Ambulatory Visit (INDEPENDENT_AMBULATORY_CARE_PROVIDER_SITE_OTHER): Payer: Medicare Other

## 2022-04-10 ENCOUNTER — Encounter: Payer: Self-pay | Admitting: Internal Medicine

## 2022-04-10 VITALS — BP 138/94 | HR 79 | Temp 98.2°F | Ht 67.0 in | Wt 220.8 lb

## 2022-04-10 DIAGNOSIS — Z23 Encounter for immunization: Secondary | ICD-10-CM

## 2022-04-10 DIAGNOSIS — R000 Tachycardia, unspecified: Secondary | ICD-10-CM

## 2022-04-10 DIAGNOSIS — R030 Elevated blood-pressure reading, without diagnosis of hypertension: Secondary | ICD-10-CM | POA: Diagnosis not present

## 2022-04-10 DIAGNOSIS — M816 Localized osteoporosis [Lequesne]: Secondary | ICD-10-CM | POA: Diagnosis not present

## 2022-04-10 DIAGNOSIS — I700 Atherosclerosis of aorta: Secondary | ICD-10-CM

## 2022-04-10 DIAGNOSIS — Z01818 Encounter for other preprocedural examination: Secondary | ICD-10-CM

## 2022-04-10 DIAGNOSIS — N9489 Other specified conditions associated with female genital organs and menstrual cycle: Secondary | ICD-10-CM

## 2022-04-10 LAB — URINALYSIS, ROUTINE W REFLEX MICROSCOPIC
Bilirubin Urine: NEGATIVE
Hgb urine dipstick: NEGATIVE
Ketones, ur: NEGATIVE
Leukocytes,Ua: NEGATIVE
Nitrite: NEGATIVE
RBC / HPF: NONE SEEN (ref 0–?)
Specific Gravity, Urine: 1.015 (ref 1.000–1.030)
Total Protein, Urine: NEGATIVE
Urine Glucose: NEGATIVE
Urobilinogen, UA: 0.2 (ref 0.0–1.0)
pH: 5.5 (ref 5.0–8.0)

## 2022-04-10 LAB — CBC WITH DIFFERENTIAL/PLATELET
Basophils Absolute: 0 10*3/uL (ref 0.0–0.1)
Basophils Relative: 0.3 % (ref 0.0–3.0)
Eosinophils Absolute: 0.1 10*3/uL (ref 0.0–0.7)
Eosinophils Relative: 1.6 % (ref 0.0–5.0)
HCT: 41.9 % (ref 36.0–46.0)
Hemoglobin: 14.3 g/dL (ref 12.0–15.0)
Lymphocytes Relative: 19 % (ref 12.0–46.0)
Lymphs Abs: 1.4 10*3/uL (ref 0.7–4.0)
MCHC: 34.1 g/dL (ref 30.0–36.0)
MCV: 91.9 fl (ref 78.0–100.0)
Monocytes Absolute: 0.6 10*3/uL (ref 0.1–1.0)
Monocytes Relative: 7.5 % (ref 3.0–12.0)
Neutro Abs: 5.5 10*3/uL (ref 1.4–7.7)
Neutrophils Relative %: 71.6 % (ref 43.0–77.0)
Platelets: 212 10*3/uL (ref 150.0–400.0)
RBC: 4.56 Mil/uL (ref 3.87–5.11)
RDW: 13.4 % (ref 11.5–15.5)
WBC: 7.6 10*3/uL (ref 4.0–10.5)

## 2022-04-10 LAB — COMPREHENSIVE METABOLIC PANEL
ALT: 27 U/L (ref 0–35)
AST: 23 U/L (ref 0–37)
Albumin: 4.1 g/dL (ref 3.5–5.2)
Alkaline Phosphatase: 98 U/L (ref 39–117)
BUN: 15 mg/dL (ref 6–23)
CO2: 29 mEq/L (ref 19–32)
Calcium: 9.7 mg/dL (ref 8.4–10.5)
Chloride: 103 mEq/L (ref 96–112)
Creatinine, Ser: 0.92 mg/dL (ref 0.40–1.20)
GFR: 63.82 mL/min (ref >60.00)
Glucose, Bld: 87 mg/dL (ref 70–99)
Potassium: 4.2 mEq/L (ref 3.5–5.1)
Sodium: 139 mEq/L (ref 135–145)
Total Bilirubin: 0.6 mg/dL (ref 0.2–1.2)
Total Protein: 7.2 g/dL (ref 6.0–8.3)

## 2022-04-10 LAB — MICROALBUMIN / CREATININE URINE RATIO
Creatinine,U: 84.6 mg/dL
Microalb Creat Ratio: 0.9 mg/g (ref 0.0–30.0)
Microalb, Ur: 0.8 mg/dL (ref 0.0–1.9)

## 2022-04-10 MED ORDER — METOPROLOL SUCCINATE ER 25 MG PO TB24: 25.0000 mg | ORAL_TABLET | Freq: Every day | ORAL | 3 refills | Status: DC

## 2022-04-10 MED ORDER — ENOXAPARIN SODIUM 100 MG/ML IJ SOSY: 100.0000 mg | PREFILLED_SYRINGE | Freq: Two times a day (BID) | INTRAMUSCULAR | 0 refills | Status: DC

## 2022-04-10 NOTE — Patient Instructions (Addendum)
For your upcoming surgery:  [facility]  [facility].  (10 doses total,  it's every 12 hours)   Stop the Eliquis 3 days prior to surgery  (for example, the Sept 21st would be last dose for surgery on sept 25)  Take the lovenox every 12 hours starting 2 days  prior to surgery ( evening of Sept 22 using above example , last dose the evening  of Sept 24  )  Let Dr Polly Cobia decide when to take the first post operative dose of Lovenox and Eliquis   Adding Metoprolol XL once daily for the blood pressure.  Goal is 130/80 or less.    For the osteoporosis:   We will try to get Prolia approved

## 2022-04-10 NOTE — Assessment & Plan Note (Signed)
Preoperative evaluation done today in anticipation of tomorrow's meet with Dr Polly Cobia.  Bridging with Lovenox outlined and rx given to patient with advice to stop lovenox 3 days prior to surgery and start lovenox 2 days prior to surgery

## 2022-04-10 NOTE — Progress Notes (Unsigned)
Subjective:  Patient ID: Sandra Phillips, female    DOB: 1950/08/30  Age: 71 y.o. MRN: [id number]  CC: The primary encounter diagnosis was Elevated BP without diagnosis of hypertension. Diagnoses of Endometrial mass, Localized osteoporosis without current pathological fracture, Preoperative evaluation of a medical condition to rule out surgical contraindications (TAR required), Need for immunization against influenza, Sinus tachycardia, and Aortic atherosclerosis ([place]) were also pertinent to this visit.   HPI ELIZ NIGG presents for  Chief Complaint  Patient presents with   Follow-up   1) enodmetrial mass found by Dr Garwin Brothers during workup for PMB.  Patient  has been referred to surgical oncology and has an appt  on Sept 22 with Dr Polly Cobia.  Today we reviewed the potential etiologies.   2) elevated blood pressures  .  Does not check at home.  Given impending surgery,  will start metoprolol  3) Osteoporosis:  by recent DEXA.  Reviewed options for treatment.  Prolia selected,  PA will be sought.  4) /o DVT/PE:  bridging with lovenox done in the past and discussed today with rx given.   Outpatient Medications Prior to Visit  Medication Sig Dispense Refill   acetaminophen (TYLENOL) 500 MG tablet Take by mouth.     ELIQUIS 5 MG TABS tablet TAKE ONE (1) TABLET BY MOUTH TWO TIMES PER DAY 60 tablet 6   rosuvastatin (CRESTOR) 10 MG tablet Take 1 tablet (10 mg total) by mouth daily. 90 tablet 1   enoxaparin (LOVENOX) 100 MG/ML injection Inject 1 mL (100 mg total) into the skin every 12 (twelve) hours. STARTING THE AM OF JUN 20 6 mL 0   No facility-administered medications prior to visit.    Review of Systems;  Patient denies headache, fevers, malaise, unintentional weight loss, skin rash, eye pain, sinus congestion and sinus pain, sore throat, dysphagia,  hemoptysis , cough, dyspnea, wheezing, chest pain, palpitations, orthopnea, edema, abdominal pain, nausea, melena, diarrhea, constipation,  flank pain, dysuria, hematuria, urinary  Frequency, nocturia, numbness, tingling, seizures,  Focal weakness, Loss of consciousness,  Tremor, insomnia, depression, anxiety, and suicidal ideation.      Objective:  BP (!) 138/94 (BP Location: Left Arm, Patient Position: Sitting, Cuff Size: Large)   Pulse 79   Temp 98.2 F (36.8 C) (Oral)   Ht '5\' 7"'$  (1.702 m)   Wt 220 lb 12.8 oz (100.2 kg)   SpO2 96%   BMI 34.58 kg/m   BP Readings from Last 3 Encounters:  04/10/22 (!) 138/94  02/11/22 130/80  01/11/22 109/69    Wt Readings from Last 3 Encounters:  04/10/22 220 lb 12.8 oz (100.2 kg)  01/16/22 223 lb (101.2 kg)  01/11/22 223 lb (101.2 kg)    General appearance: alert, cooperative and appears stated age Ears: normal TM's and external ear canals both ears Throat: lips, mucosa, and tongue normal; teeth and gums normal Neck: no adenopathy, no carotid bruit, supple, symmetrical, trachea midline and thyroid not enlarged, symmetric, no tenderness/mass/nodules Back: symmetric, no curvature. ROM normal. No CVA tenderness. Lungs: clear to auscultation bilaterally Heart: regular rate and rhythm, S1, S2 normal, no murmur, click, rub or gallop Abdomen: soft, non-tender; bowel sounds normal; no masses,  no organomegaly Pulses: 2+ and symmetric Skin: Skin color, texture, turgor normal. No rashes or lesions Lymph nodes: Cervical, supraclavicular, and axillary nodes normal. Neuro:  awake and interactive with normal mood and affect. Higher cortical functions are normal. Speech is clear without word-finding difficulty or dysarthria. Extraocular movements are intact.  Visual fields of both eyes are grossly intact. Sensation to light touch is grossly intact bilaterally of upper and lower extremities. Motor examination shows 4+/5 symmetric hand grip and upper extremity and 5/5 lower extremity strength. There is no pronation or drift. Gait is non-ataxic   Lab Results  Component Value Date   HGBA1C 5.5  12/05/2021    Lab Results  Component Value Date   CREATININE 0.92 04/10/2022   CREATININE 0.80 02/11/2022   CREATININE 0.89 12/05/2021    Lab Results  Component Value Date   WBC 7.6 04/10/2022   HGB 14.3 04/10/2022   HCT 41.9 04/10/2022   PLT 212.0 04/10/2022   GLUCOSE 87 04/10/2022   CHOL 227 (H) 12/05/2021   TRIG 123.0 12/05/2021   HDL 61.80 12/05/2021   LDLCALC 141 (H) 12/05/2021   ALT 27 04/10/2022   AST 23 04/10/2022   NA 139 04/10/2022   K 4.2 04/10/2022   CL 103 04/10/2022   CREATININE 0.92 04/10/2022   BUN 15 04/10/2022   CO2 29 04/10/2022   TSH 3.24 12/05/2021   INR 1.1 07/07/2021   HGBA1C 5.5 12/05/2021   MICROALBUR 0.8 04/10/2022    MM 3D SCREEN BREAST BILATERAL  Result Date: 04/04/2022 CLINICAL DATA:  Screening. EXAM: DIGITAL SCREENING BILATERAL MAMMOGRAM WITH TOMOSYNTHESIS AND CAD TECHNIQUE: Bilateral screening digital craniocaudal and mediolateral oblique mammograms were obtained. Bilateral screening digital breast tomosynthesis was performed. The images were evaluated with computer-aided detection. COMPARISON:  None available. ACR Breast Density Category c: The breast tissue is heterogeneously dense, which may obscure small masses FINDINGS: There are no findings suspicious for malignancy. IMPRESSION: No mammographic evidence of malignancy. A result letter of this screening mammogram will be mailed directly to the patient. RECOMMENDATION: Screening mammogram in one year. (Code:SM-B-01Y) BI-RADS CATEGORY  1: Negative. Electronically Signed   By: Everlean Alstrom M.D.   On: 04/04/2022 13:36   DG Bone Density  Result Date: 04/03/2022 EXAM: DUAL X-RAY ABSORPTIOMETRY (DXA) FOR BONE MINERAL DENSITY IMPRESSION: Your patient Tinlee Navarrette completed a BMD test on 04/03/2022 using the [place] (software version: 14.10) manufactured by [employer]. The following summarizes the results of our evaluation. Technologist: SCE PATIENT BIOGRAPHICAL: Name:  Sandra Phillips Patient ID: [id number] Birth Date: Aug 07, 1950 Height: 66.0 in. Gender: Female Exam Date: 04/03/2022 Weight: 220.0 lbs. Indications: Caucasian, Height Loss, Postmenopausal Fractures: Treatments: DENSITOMETRY RESULTS: Site      Region     Measured Date Measured Age WHO Classification Young Adult T-score BMD         %Change vs. Previous Significant Change (*) AP Spine L1-L4 (L3) 04/03/2022 68.8 Osteopenia -1.5 1.005 g/cm2 - - DualFemur Total Left 04/03/2022 68.8 Osteoporosis -2.9 0.646 g/cm2 - - ASSESSMENT: The BMD measured at Femur Total Left is 0.646 g/cm2 with a T-score of -2.9. This patient is considered osteoporotic according to [facility]) criteria. The scan quality is good. L3 was excluded due to degenerative changes. World Pharmac[facility]) criteria for post-menopausal, Caucasian Women: Normal:                   T-score at or above -1 SD Osteopenia/low bone mass: T-score between -1 and -2.5 SD Osteoporosis:             T-score at or below -2.5 SD RECOMMENDATIONS: 1. All patients should optimize calcium and vitamin D intake. 2. Consider FDA-approved medical therapies in postmenopausal women and men aged 59 years and older, based on the following:  a. A hip or vertebral(clinical or morphometric) fracture b. T-score < -2.5 at the femoral neck or spine after appropriate evaluation to exclude secondary causes c. Low bone mass (T-score between -1.0 and -2.5 at the femoral neck or spine) and a 10-year probability of a hip fracture > 3% or a 10-year probability of a major osteoporosis-related fracture > 20% based on the [place]-adapted WHO algorithm 3. Clinician judgment and/or patient preferences may indicate treatment for people with 10-year fracture probabilities above or below these levels FOLLOW-UP: People with diagnosed cases of osteoporosis or at high risk for fracture should have regular bone mineral density tests. For patients eligible for Medicare, routine testing is allowed  once every 2 years. The testing frequency can be increased to one year for patients who have rapidly progressing disease, those who are receiving or discontinuing medical therapy to restore bone mass, or have additional risk factors. I have reviewed this report, and agree with the above findings. [facility] Electronically Signed   By: Elmer Picker M.D.   On: 04/03/2022 13:26    Assessment & Plan:   Problem List Items Addressed This Visit     Sinus tachycardia    Starting metoprolol low dose      Preoperative evaluation of a medical condition to rule out surgical contraindications (TAR required)    I have ordered and reviewed a 12 lead EKG and find that there are no acute changes and patient is in sinus rhythm.  Chest x ray report is pending but appears normal.  Patient is considered low risk for surgery.  Metoprolol has been prescribed for management of hypertension .  lovenox bridging during suspension of eliquis discussed and rx given       Relevant Orders   EKG 12-Lead   CBC with Differential/Platelet (Completed)   Urinalysis, Routine w reflex microscopic (Completed)   DG Chest 2 View   Comprehensive metabolic panel (Completed)   Osteoporosis    Discussed getting PA for Prolia.  Evista is contraindicated.  If denied,  Boniva/Fosomax would be chosen      Endometrial mass    Preoperative evaluation done today in anticipation of tomorrow's meet with Dr Polly Cobia.  Bridging with Lovenox outlined and rx given to patient with advice to stop lovenox 3 days prior to surgery and start lovenox 2 days prior to surgery       Aortic atherosclerosis ([place])    She is tolerating rosuvastatin      Relevant Medications   enoxaparin (LOVENOX) 100 MG/ML injection   metoprolol succinate (TOPROL-XL) 25 MG 24 hr tablet   Other Visit Diagnoses     Elevated BP without diagnosis of hypertension    -  Primary   Relevant Orders   Urine Microalbumin w/creat. ratio (Completed)    Need for immunization against influenza       Relevant Orders   Flu Vaccine QUAD High Dose(Fluad) (Completed)       I spent a total of  40 minutes with this patient in a face to face visit on the date of this encounter reviewing the last office visit with me  July, ,  most recent visit with gynecology, home blood pressure recent labs and imaging studies ,   and post visit ordering of testing and therapeutics.    Follow-up: No follow-ups on file.   [name], MD

## 2022-04-10 NOTE — Assessment & Plan Note (Signed)
Discussed getting PA for Prolia.  Evista is contraindicated.  If denied,  Boniva/Fosomax would be chosen

## 2022-04-11 DIAGNOSIS — N858 Other specified noninflammatory disorders of uterus: Secondary | ICD-10-CM | POA: Insufficient documentation

## 2022-04-11 DIAGNOSIS — R1900 Intra-abdominal and pelvic swelling, mass and lump, unspecified site: Secondary | ICD-10-CM | POA: Diagnosis not present

## 2022-04-11 NOTE — Assessment & Plan Note (Signed)
She is tolerating rosuvastatin

## 2022-04-11 NOTE — Assessment & Plan Note (Signed)
Starting metoprolol low dose

## 2022-04-11 NOTE — Assessment & Plan Note (Addendum)
I have ordered and reviewed a 12 lead EKG and find that there are no acute changes and patient is in sinus rhythm.  Chest x ray report is pending but appears normal.  Patient is considered low risk for surgery.  Metoprolol has been prescribed for management of hypertension .  lovenox bridging during suspension of eliquis discussed and rx given

## 2022-04-15 DIAGNOSIS — R9431 Abnormal electrocardiogram [ECG] [EKG]: Secondary | ICD-10-CM | POA: Diagnosis not present

## 2022-04-15 DIAGNOSIS — Z01818 Encounter for other preprocedural examination: Secondary | ICD-10-CM | POA: Diagnosis not present

## 2022-04-15 DIAGNOSIS — Z86718 Personal history of other venous thrombosis and embolism: Secondary | ICD-10-CM | POA: Diagnosis not present

## 2022-04-15 DIAGNOSIS — Z7901 Long term (current) use of anticoagulants: Secondary | ICD-10-CM | POA: Diagnosis not present

## 2022-04-15 DIAGNOSIS — Z86711 Personal history of pulmonary embolism: Secondary | ICD-10-CM | POA: Diagnosis not present

## 2022-04-15 DIAGNOSIS — N858 Other specified noninflammatory disorders of uterus: Secondary | ICD-10-CM | POA: Diagnosis not present

## 2022-04-15 DIAGNOSIS — Z01812 Encounter for preprocedural laboratory examination: Secondary | ICD-10-CM | POA: Diagnosis not present

## 2022-04-15 DIAGNOSIS — K449 Diaphragmatic hernia without obstruction or gangrene: Secondary | ICD-10-CM | POA: Diagnosis not present

## 2022-04-15 DIAGNOSIS — I083 Combined rheumatic disorders of mitral, aortic and tricuspid valves: Secondary | ICD-10-CM | POA: Diagnosis not present

## 2022-04-15 DIAGNOSIS — Z01810 Encounter for preprocedural cardiovascular examination: Secondary | ICD-10-CM | POA: Diagnosis not present

## 2022-04-15 DIAGNOSIS — Z79899 Other long term (current) drug therapy: Secondary | ICD-10-CM | POA: Diagnosis not present

## 2022-04-15 DIAGNOSIS — I10 Essential (primary) hypertension: Secondary | ICD-10-CM | POA: Diagnosis not present

## 2022-04-15 DIAGNOSIS — Z8679 Personal history of other diseases of the circulatory system: Secondary | ICD-10-CM | POA: Diagnosis not present

## 2022-04-15 DIAGNOSIS — D269 Other benign neoplasm of uterus, unspecified: Secondary | ICD-10-CM | POA: Diagnosis not present

## 2022-04-18 DIAGNOSIS — N858 Other specified noninflammatory disorders of uterus: Secondary | ICD-10-CM | POA: Diagnosis not present

## 2022-04-18 DIAGNOSIS — N950 Postmenopausal bleeding: Secondary | ICD-10-CM | POA: Diagnosis not present

## 2022-04-19 DIAGNOSIS — Z91013 Allergy to seafood: Secondary | ICD-10-CM | POA: Diagnosis not present

## 2022-04-19 DIAGNOSIS — E785 Hyperlipidemia, unspecified: Secondary | ICD-10-CM | POA: Diagnosis not present

## 2022-04-19 DIAGNOSIS — Z880 Allergy status to penicillin: Secondary | ICD-10-CM | POA: Diagnosis not present

## 2022-04-19 DIAGNOSIS — M7989 Other specified soft tissue disorders: Secondary | ICD-10-CM | POA: Diagnosis not present

## 2022-04-19 DIAGNOSIS — I10 Essential (primary) hypertension: Secondary | ICD-10-CM | POA: Diagnosis not present

## 2022-04-19 DIAGNOSIS — D252 Subserosal leiomyoma of uterus: Secondary | ICD-10-CM | POA: Diagnosis not present

## 2022-04-19 DIAGNOSIS — C541 Malignant neoplasm of endometrium: Secondary | ICD-10-CM | POA: Diagnosis not present

## 2022-04-19 DIAGNOSIS — Z86718 Personal history of other venous thrombosis and embolism: Secondary | ICD-10-CM | POA: Diagnosis not present

## 2022-04-19 DIAGNOSIS — D251 Intramural leiomyoma of uterus: Secondary | ICD-10-CM | POA: Diagnosis not present

## 2022-04-19 DIAGNOSIS — Z7901 Long term (current) use of anticoagulants: Secondary | ICD-10-CM | POA: Diagnosis not present

## 2022-04-19 DIAGNOSIS — N950 Postmenopausal bleeding: Secondary | ICD-10-CM | POA: Diagnosis not present

## 2022-04-19 DIAGNOSIS — I272 Pulmonary hypertension, unspecified: Secondary | ICD-10-CM | POA: Diagnosis not present

## 2022-04-19 DIAGNOSIS — Z86711 Personal history of pulmonary embolism: Secondary | ICD-10-CM | POA: Diagnosis not present

## 2022-04-19 DIAGNOSIS — Z79899 Other long term (current) drug therapy: Secondary | ICD-10-CM | POA: Diagnosis not present

## 2022-04-20 DIAGNOSIS — I10 Essential (primary) hypertension: Secondary | ICD-10-CM | POA: Diagnosis not present

## 2022-04-20 DIAGNOSIS — E785 Hyperlipidemia, unspecified: Secondary | ICD-10-CM | POA: Diagnosis not present

## 2022-04-20 DIAGNOSIS — N950 Postmenopausal bleeding: Secondary | ICD-10-CM | POA: Diagnosis not present

## 2022-04-20 DIAGNOSIS — C541 Malignant neoplasm of endometrium: Secondary | ICD-10-CM | POA: Diagnosis not present

## 2022-04-20 DIAGNOSIS — Z86718 Personal history of other venous thrombosis and embolism: Secondary | ICD-10-CM | POA: Diagnosis not present

## 2022-04-20 DIAGNOSIS — D251 Intramural leiomyoma of uterus: Secondary | ICD-10-CM | POA: Diagnosis not present

## 2022-04-20 DIAGNOSIS — Z91013 Allergy to seafood: Secondary | ICD-10-CM | POA: Diagnosis not present

## 2022-04-20 DIAGNOSIS — Z7901 Long term (current) use of anticoagulants: Secondary | ICD-10-CM | POA: Diagnosis not present

## 2022-04-20 DIAGNOSIS — D252 Subserosal leiomyoma of uterus: Secondary | ICD-10-CM | POA: Diagnosis not present

## 2022-04-20 DIAGNOSIS — Z79899 Other long term (current) drug therapy: Secondary | ICD-10-CM | POA: Diagnosis not present

## 2022-04-20 DIAGNOSIS — Z880 Allergy status to penicillin: Secondary | ICD-10-CM | POA: Diagnosis not present

## 2022-04-20 DIAGNOSIS — Z86711 Personal history of pulmonary embolism: Secondary | ICD-10-CM | POA: Diagnosis not present

## 2022-04-21 ENCOUNTER — Encounter: Payer: Self-pay | Admitting: *Deleted

## 2022-04-21 NOTE — Telephone Encounter (Signed)
$  276 due; no PA required  04/21/22-Sent mychart message

## 2022-04-25 DIAGNOSIS — C541 Malignant neoplasm of endometrium: Secondary | ICD-10-CM | POA: Diagnosis not present

## 2022-04-25 NOTE — Telephone Encounter (Signed)
Spoke with patient & she has decided to hold off on Prolia due to cost & discuss at next [place] on 10/17 with PCP to discuss alternative options.

## 2022-04-25 NOTE — Telephone Encounter (Signed)
Last read by Nelida Gores at  5:52 PM on 04/21/2022.

## 2022-04-29 ENCOUNTER — Encounter: Payer: Self-pay | Admitting: Internal Medicine

## 2022-04-29 ENCOUNTER — Ambulatory Visit (INDEPENDENT_AMBULATORY_CARE_PROVIDER_SITE_OTHER): Payer: Medicare Other | Admitting: Internal Medicine

## 2022-04-29 VITALS — BP 142/78 | HR 59 | Temp 98.4°F | Ht 67.0 in | Wt 220.4 lb

## 2022-04-29 DIAGNOSIS — C541 Malignant neoplasm of endometrium: Secondary | ICD-10-CM

## 2022-04-29 DIAGNOSIS — I10 Essential (primary) hypertension: Secondary | ICD-10-CM | POA: Diagnosis not present

## 2022-04-29 DIAGNOSIS — R000 Tachycardia, unspecified: Secondary | ICD-10-CM

## 2022-04-29 DIAGNOSIS — Z23 Encounter for immunization: Secondary | ICD-10-CM | POA: Diagnosis not present

## 2022-04-29 DIAGNOSIS — E559 Vitamin D deficiency, unspecified: Secondary | ICD-10-CM | POA: Diagnosis not present

## 2022-04-29 DIAGNOSIS — M816 Localized osteoporosis [Lequesne]: Secondary | ICD-10-CM

## 2022-04-29 MED ORDER — ALENDRONATE SODIUM 70 MG PO TABS: 70.0000 mg | ORAL_TABLET | ORAL | 11 refills | Status: DC

## 2022-04-29 MED ORDER — TELMISARTAN 40 MG PO TABS: 40.0000 mg | ORAL_TABLET | Freq: Every day | ORAL | 1 refills | Status: DC

## 2022-04-29 NOTE — Assessment & Plan Note (Signed)
startig weekly alendronate .  Vitamin d level needs checking

## 2022-04-29 NOTE — Assessment & Plan Note (Addendum)
Her last several readings have been elevated..  Will continue metoprolol for cardioprotection and add telmisartan 40 mg daily  Return in one week for bmet and BP check

## 2022-04-29 NOTE — Assessment & Plan Note (Signed)
1. 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

## 2022-04-29 NOTE — Assessment & Plan Note (Signed)
Improved with low dose metoprolol  Continue current medication

## 2022-04-29 NOTE — Patient Instructions (Addendum)
Melatonin  up to 6 mg after dinner . For the insomnia:  You might want to try using Relaxium for insomnia  (as seen on TV commercials) . It is available through [employer] and contains all natural supplements:  Melatonin 5 mg  Chamomile 25 mg Passionflower extract 75 mg GABA 100 mg Ashwaganda extract 125 mg Magnesium citrate, glycinate, oxide (100 mg)  L tryptophan 500 mg Valerest (proprietary  ingredient ; probably valeria root extract)     Or tylenol PM (contains  dipenhydramine)  For the Blood pressure:   Starting telmisartan for blood pressure,  continue metoprolol in the evening,  add telmisartan in the morning . Return one week for non fasting labs  and an RN visit to check BP in one week ,  goal is 120/70 to 130/80   For the osteoporosis   starting alendronate for osteoporosis ,  take once a week on empty stomach. Full glass of water.  No lying down for 45 minutes  You received the pneumonia vaccine today called "Prevnar 20"

## 2022-04-29 NOTE — Progress Notes (Signed)
Subjective:  Patient ID: Sandra Phillips, female    DOB: 1950-11-05  Age: 71 y.o. MRN: [id number]  CC: The primary encounter diagnosis was Vitamin D deficiency. Diagnoses of Essential hypertension, Endometrial cancer ([place]), Sinus tachycardia, and Localized osteoporosis without current pathological fracture were also pertinent to this visit.   HPI Sandra Phillips presents for  Chief Complaint  Patient presents with   Follow-up    Discuss osteoporosis & hysterectomy    1. She is s/p TAH/BSO on Sept 29 by Dr Polly Cobia with path report revealing  IB grade 1 endometrioid adenocarcinoma of the uterus  .  Left ovarian torsion also noted at time of surgery with no evidence. of mets.Marland Kitchen    she was seen for follow up in Dr Shelba Flake office;  Pathology is reviewed and based on these findings, she is considered to be at high intermediate risk for vaginal cuff recurrence.  They discussed the role of vaginal cuff brachytherapy in this setting and we will ultimately plan on referral to radiation oncology when she has completed healing.  2) osteoporosis:  given her history of DVT/PE, Evista is not an option.  Discussed alternativess including Prolia and bisphosponates.    3) chronic anticoagulation:  on lifelong Eliquis now in the donut hole $137/month oop cost  Outpatient Medications Prior to Visit  Medication Sig Dispense Refill   acetaminophen (TYLENOL) 500 MG tablet Take by mouth.     ELIQUIS 5 MG TABS tablet TAKE ONE (1) TABLET BY MOUTH TWO TIMES PER DAY 60 tablet 6   metoprolol succinate (TOPROL-XL) 25 MG 24 hr tablet Take 1 tablet (25 mg total) by mouth daily. 90 tablet 3   rosuvastatin (CRESTOR) 10 MG tablet Take 1 tablet (10 mg total) by mouth daily. 90 tablet 1   enoxaparin (LOVENOX) 100 MG/ML injection Inject 1 mL (100 mg total) into the skin every 12 (twelve) hours. (Patient not taking: Reported on 04/29/2022) 10 mL 0   No facility-administered medications prior to visit.    Review of  Systems;  Patient denies headache, fevers, malaise, unintentional weight loss, skin rash, eye pain, sinus congestion and sinus pain, sore throat, dysphagia,  hemoptysis , cough, dyspnea, wheezing, chest pain, palpitations, orthopnea, edema, abdominal pain, nausea, melena, diarrhea, constipation, flank pain, dysuria, hematuria, urinary  Frequency, nocturia, numbness, tingling, seizures,  Focal weakness, Loss of consciousness,  Tremor, insomnia, depression, anxiety, and suicidal ideation.      Objective:  BP (!) 142/78 (BP Location: Left Arm, Patient Position: Sitting, Cuff Size: Normal)   Pulse (!) 59   Temp 98.4 F (36.9 C) (Oral)   Ht '5\' 7"'$  (1.702 m)   Wt 220 lb 6.4 oz (100 kg)   SpO2 99%   BMI 34.52 kg/m   BP Readings from Last 3 Encounters:  04/29/22 (!) 142/78  04/10/22 (!) 138/94  02/11/22 130/80    Wt Readings from Last 3 Encounters:  04/29/22 220 lb 6.4 oz (100 kg)  04/10/22 220 lb 12.8 oz (100.2 kg)  01/16/22 223 lb (101.2 kg)    General appearance: alert, cooperative and appears stated age Ears: normal TM's and external ear canals both ears Throat: lips, mucosa, and tongue normal; teeth and gums normal Neck: no adenopathy, no carotid bruit, supple, symmetrical, trachea midline and thyroid not enlarged, symmetric, no tenderness/mass/nodules Back: symmetric, no curvature. ROM normal. No CVA tenderness. Lungs: clear to auscultation bilaterally Heart: regular rate and rhythm, S1, S2 normal, no murmur, click, rub or gallop Abdomen: soft, non-tender; bowel  sounds normal; no masses,  no organomegaly Pulses: 2+ and symmetric Skin: Skin color, texture, turgor normal. No rashes or lesions Lymph nodes: Cervical, supraclavicular, and axillary nodes normal. Neuro:  awake and interactive with normal mood and affect. Higher cortical functions are normal. Speech is clear without word-finding difficulty or dysarthria. Extraocular movements are intact. Visual fields of both eyes are  grossly intact. Sensation to light touch is grossly intact bilaterally of upper and lower extremities. Motor examination shows 4+/5 symmetric hand grip and upper extremity and 5/5 lower extremity strength. There is no pronation or drift. Gait is non-ataxic   Lab Results  Component Value Date   HGBA1C 5.5 12/05/2021    Lab Results  Component Value Date   CREATININE 0.92 04/10/2022   CREATININE 0.80 02/11/2022   CREATININE 0.89 12/05/2021    Lab Results  Component Value Date   WBC 7.6 04/10/2022   HGB 14.3 04/10/2022   HCT 41.9 04/10/2022   PLT 212.0 04/10/2022   GLUCOSE 87 04/10/2022   CHOL 227 (H) 12/05/2021   TRIG 123.0 12/05/2021   HDL 61.80 12/05/2021   LDLCALC 141 (H) 12/05/2021   ALT 27 04/10/2022   AST 23 04/10/2022   NA 139 04/10/2022   K 4.2 04/10/2022   CL 103 04/10/2022   CREATININE 0.92 04/10/2022   BUN 15 04/10/2022   CO2 29 04/10/2022   TSH 3.24 12/05/2021   INR 1.1 07/07/2021   HGBA1C 5.5 12/05/2021   MICROALBUR 0.8 04/10/2022    MM 3D SCREEN BREAST BILATERAL  Result Date: 04/04/2022 CLINICAL DATA:  Screening. EXAM: DIGITAL SCREENING BILATERAL MAMMOGRAM WITH TOMOSYNTHESIS AND CAD TECHNIQUE: Bilateral screening digital craniocaudal and mediolateral oblique mammograms were obtained. Bilateral screening digital breast tomosynthesis was performed. The images were evaluated with computer-aided detection. COMPARISON:  None available. ACR Breast Density Category c: The breast tissue is heterogeneously dense, which may obscure small masses FINDINGS: There are no findings suspicious for malignancy. IMPRESSION: No mammographic evidence of malignancy. A result letter of this screening mammogram will be mailed directly to the patient. RECOMMENDATION: Screening mammogram in one year. (Code:SM-B-01Y) BI-RADS CATEGORY  1: Negative. Electronically Signed   By: Everlean Alstrom M.D.   On: 04/04/2022 13:36   DG Bone Density  Result Date: 04/03/2022 EXAM: DUAL X-RAY  ABSORPTIOMETRY (DXA) FOR BONE MINERAL DENSITY IMPRESSION: Your patient Sandra Phillips completed a BMD test on 04/03/2022 using the [place] (software version: 14.10) manufactured by [employer]. The following summarizes the results of our evaluation. Technologist: SCE PATIENT BIOGRAPHICAL: Name: Sandra, Phillips Patient ID: [id number] Birth Date: 17-Apr-1951 Height: 66.0 in. Gender: Female Exam Date: 04/03/2022 Weight: 220.0 lbs. Indications: Caucasian, Height Loss, Postmenopausal Fractures: Treatments: DENSITOMETRY RESULTS: Site      Region     Measured Date Measured Age WHO Classification Young Adult T-score BMD         %Change vs. Previous Significant Change (*) AP Spine L1-L4 (L3) 04/03/2022 68.8 Osteopenia -1.5 1.005 g/cm2 - - DualFemur Total Left 04/03/2022 68.8 Osteoporosis -2.9 0.646 g/cm2 - - ASSESSMENT: The BMD measured at Femur Total Left is 0.646 g/cm2 with a T-score of -2.9. This patient is considered osteoporotic according to [facility]) criteria. The scan quality is good. L3 was excluded due to degenerative changes. World Health Organization [facility]) criteria for post-menopausal, Caucasian Women: Normal:                   T-score at or above -1 SD Osteopenia/low bone  mass: T-score between -1 and -2.5 SD Osteoporosis:             T-score at or below -2.5 SD RECOMMENDATIONS: 1. All patients should optimize calcium and vitamin D intake. 2. Consider FDA-approved medical therapies in postmenopausal women and men aged 69 years and older, based on the following: a. A hip or vertebral(clinical or morphometric) fracture b. T-score < -2.5 at the femoral neck or spine after appropriate evaluation to exclude secondary causes c. Low bone mass (T-score between -1.0 and -2.5 at the femoral neck or spine) and a 10-year probability of a hip fracture > 3% or a 10-year probability of a major osteoporosis-related fracture > 20% based on the [place]-adapted WHO algorithm 3. Clinician judgment  and/or patient preferences may indicate treatment for people with 10-year fracture probabilities above or below these levels FOLLOW-UP: People with diagnosed cases of osteoporosis or at high risk for fracture should have regular bone mineral density tests. For patients eligible for Medicare, routine testing is allowed once every 2 years. The testing frequency can be increased to one year for patients who have rapidly progressing disease, those who are receiving or discontinuing medical therapy to restore bone mass, or have additional risk factors. I have reviewed this report, and agree with the above findings. [facility] Electronically Signed   By: Elmer Picker M.D.   On: 04/03/2022 13:26    Assessment & Plan:   Problem List Items Addressed This Visit     Sinus tachycardia    Improved with low dose metoprolol  Continue current medication       Endometrial cancer ([facility])    1. 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  Vitamin d level needs checking      Relevant Medications   alendronate (FOSAMAX) 70 MG tablet   Essential hypertension    Her last several readings have been elevated..  Will continue metoprolol for cardioprotection and add telmisartan 40 mg daily  Return in one week for bmet and BP check      Relevant Medications   telmisartan (MICARDIS) 40 MG tablet   Other Relevant Orders   Basic metabolic panel   Other Visit Diagnoses     Vitamin D deficiency    -  Primary   Relevant Orders   VITAMIN D 25 Hydroxy (Vit-D Deficiency, Fractures)       I spent a total of  39 minutes with this patient  in a face to face visit on the date of this encounter reviewing the last office visit with me in September,  her surgery and post visits, with Dr Polly Cobia.  home blood pressure readings, recent DEXA scan, and post visit ordering of testing and therapeutics.    Follow-up: Return in about 1 week (around 05/06/2022).   [name], MD

## 2022-05-06 ENCOUNTER — Ambulatory Visit (INDEPENDENT_AMBULATORY_CARE_PROVIDER_SITE_OTHER): Payer: Medicare Other | Admitting: *Deleted

## 2022-05-06 ENCOUNTER — Other Ambulatory Visit: Payer: Medicare Other

## 2022-05-06 DIAGNOSIS — I10 Essential (primary) hypertension: Secondary | ICD-10-CM | POA: Diagnosis not present

## 2022-05-06 DIAGNOSIS — E559 Vitamin D deficiency, unspecified: Secondary | ICD-10-CM

## 2022-05-06 NOTE — Progress Notes (Signed)
Pt came in today for Blood pressure check after adding a second medication. Pt denies any dizziness, headache, blurred vision, etc.  Blood Pressure was obtained on the right arm after patient sat at rest for 5 minutes.  BP: 110/60   Pt also had non-fasting labs today.

## 2022-05-07 ENCOUNTER — Ambulatory Visit: Payer: Medicare Other | Admitting: Internal Medicine

## 2022-05-07 LAB — BASIC METABOLIC PANEL
BUN: 12 mg/dL (ref 6–23)
CO2: 29 mEq/L (ref 19–32)
Calcium: 9.3 mg/dL (ref 8.4–10.5)
Chloride: 103 mEq/L (ref 96–112)
Creatinine, Ser: 0.81 mg/dL (ref 0.40–1.20)
GFR: 74.32 mL/min (ref >60.00)
Glucose, Bld: 111 mg/dL — ABNORMAL HIGH (ref 70–99)
Potassium: 4.3 mEq/L (ref 3.5–5.1)
Sodium: 139 mEq/L (ref 135–145)

## 2022-05-07 LAB — VITAMIN D 25 HYDROXY (VIT D DEFICIENCY, FRACTURES): VITD: 18.92 ng/mL — ABNORMAL LOW (ref 30.00–100.00)

## 2022-05-08 DIAGNOSIS — E559 Vitamin D deficiency, unspecified: Secondary | ICD-10-CM | POA: Insufficient documentation

## 2022-05-08 MED ORDER — ERGOCALCIFEROL 1.25 MG (50000 UT) PO CAPS: 50000.0000 [IU] | ORAL_CAPSULE | ORAL | 0 refills | Status: DC

## 2022-05-08 NOTE — Assessment & Plan Note (Signed)
Megadose weekly prescribed x 12 weeks

## 2022-05-08 NOTE — Addendum Note (Signed)
Addended by: [name] on: 05/08/2022 08:52 PM   Modules accepted: Orders

## 2022-05-18 IMAGING — US US EXTREM LOW VENOUS*L*
1 series · 13 of 24 positions shown · non-contrast
Comparison: None.

CLINICAL DATA: Left lower extremity pain and swelling, history of
recent knee surgery, initial encounter



[Series 1: us venous img lower uni left (dvt) · portal-venous · 13 of 29 slices shown]
[im 1/29]
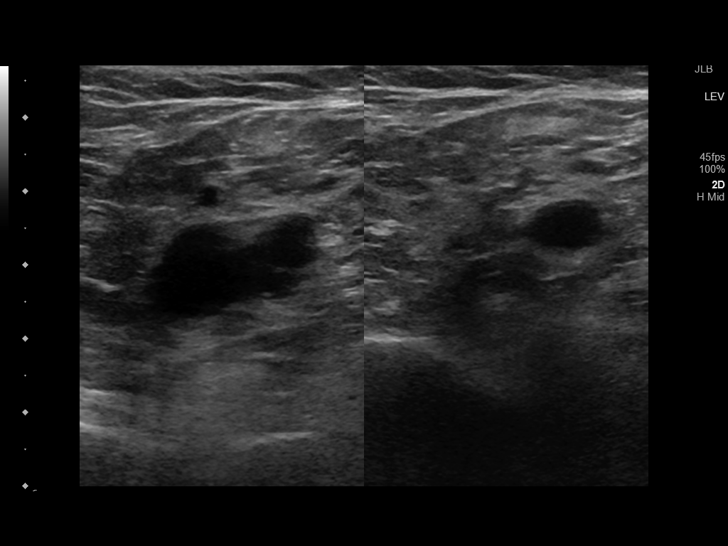
[im 3/29]
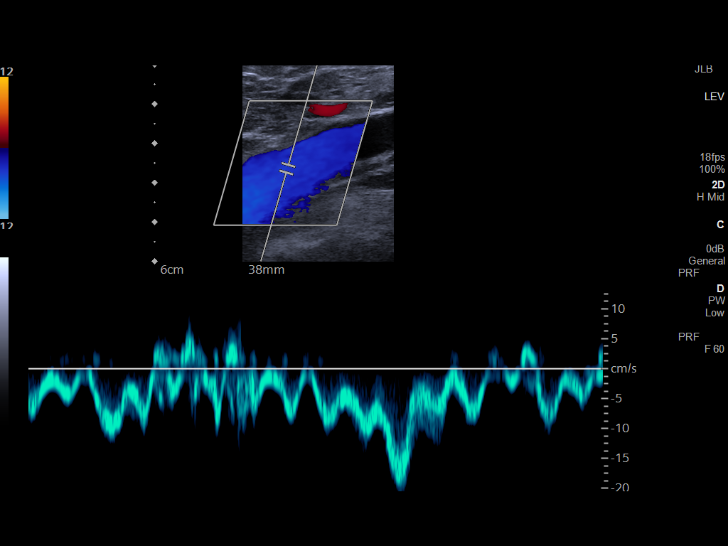
[im 5/29]
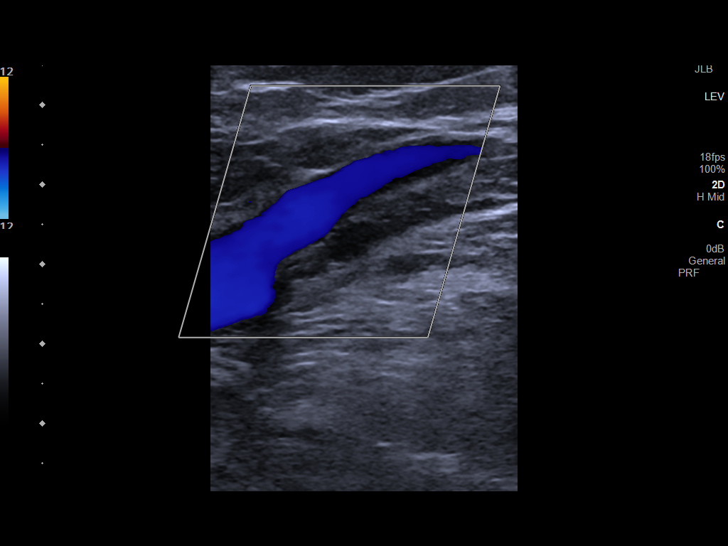
[im 8/29]
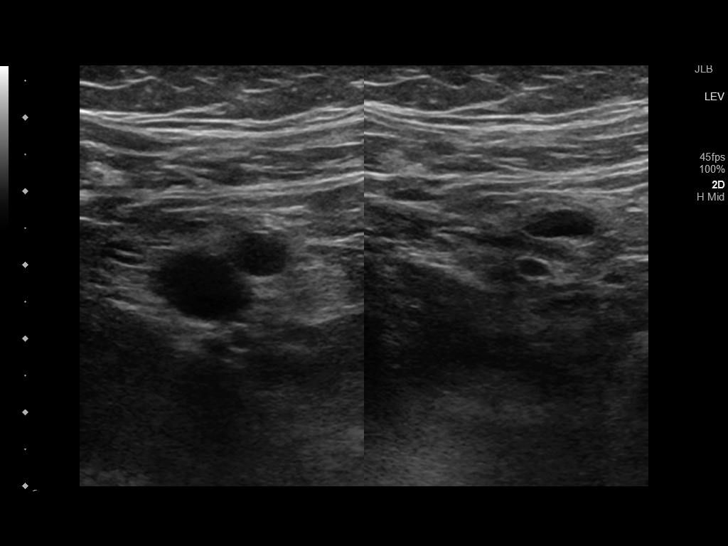
[im 10/29]
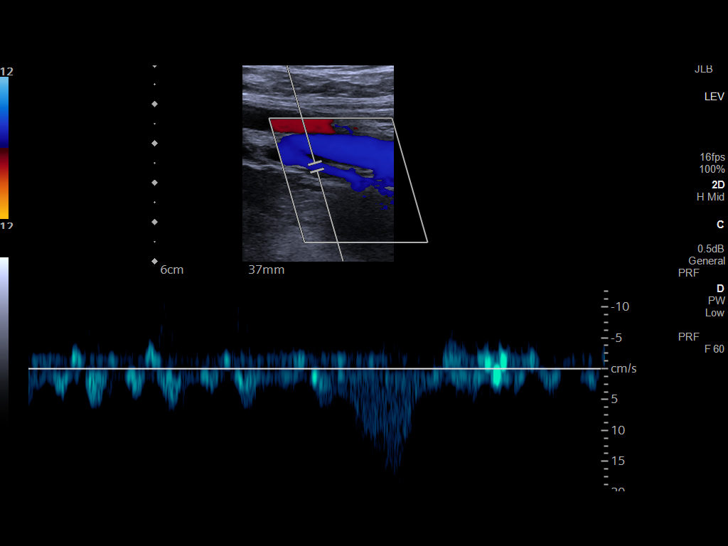
[im 13/29]
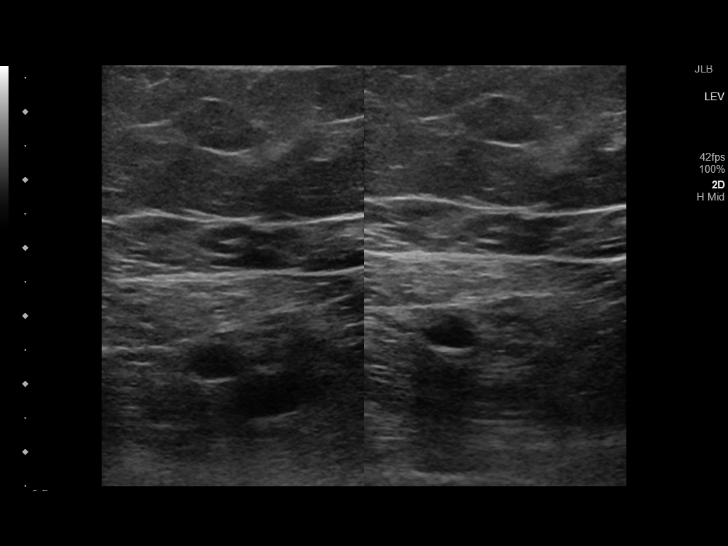
[im 15/29]
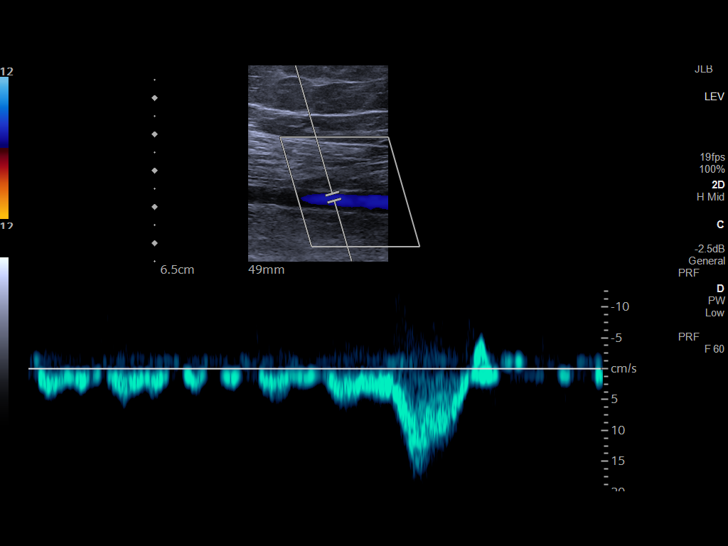
[im 16/29]
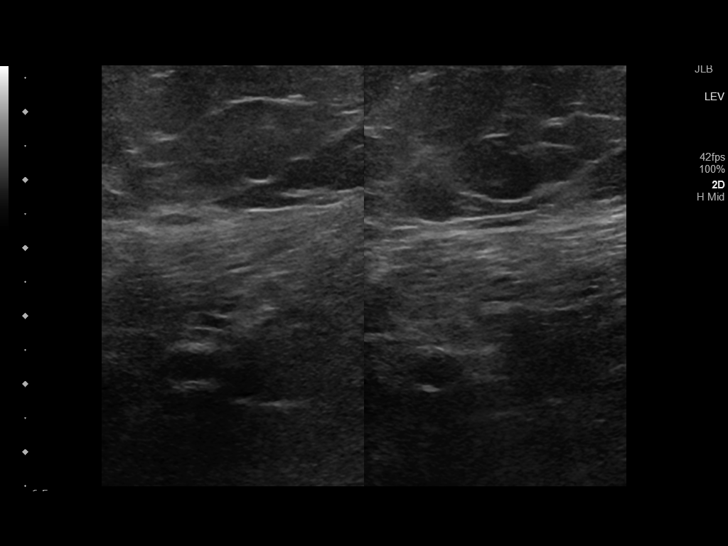
[im 19/29]
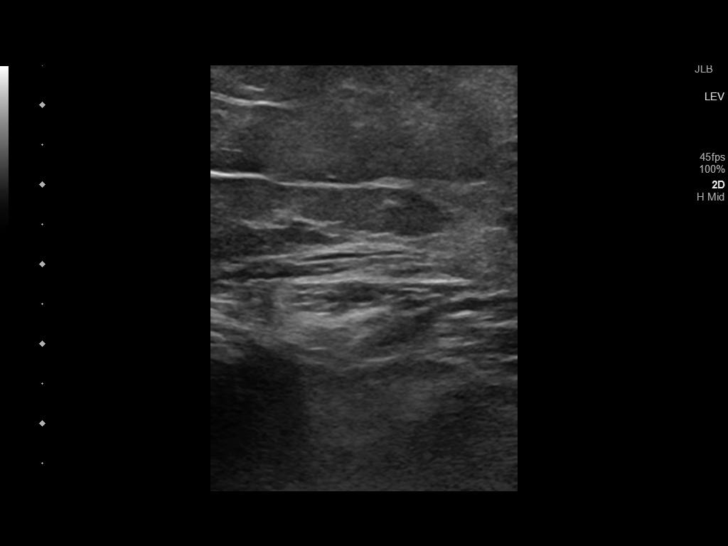
[im 21/29]
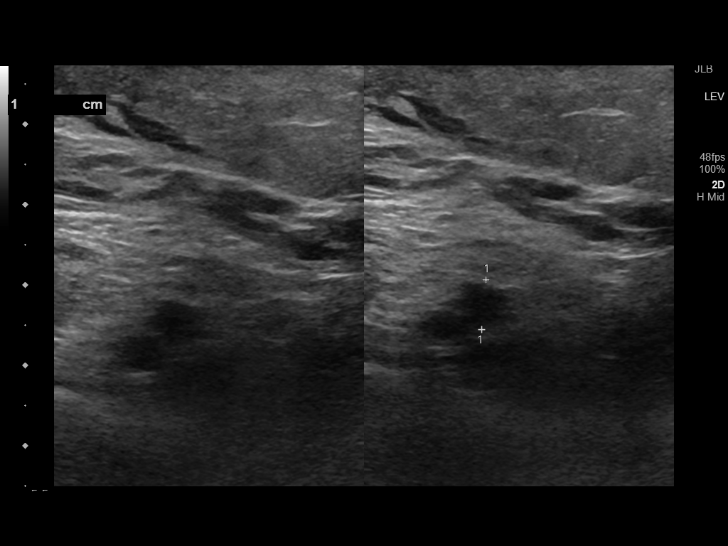
[im 24/29]
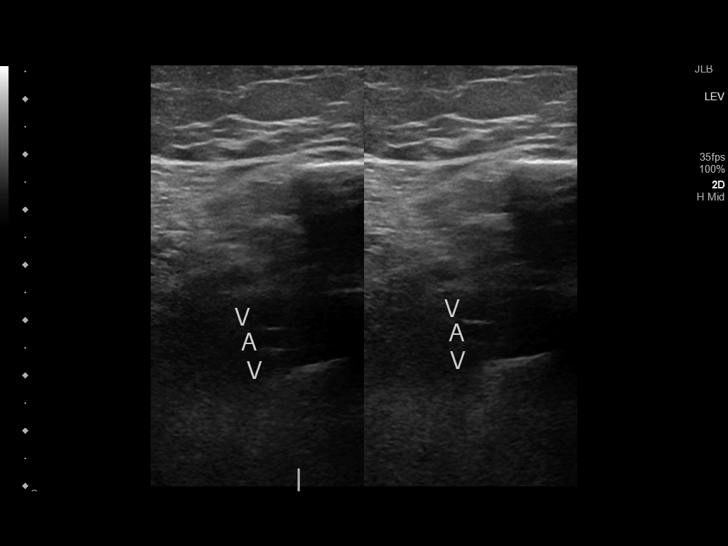
[im 26/29]
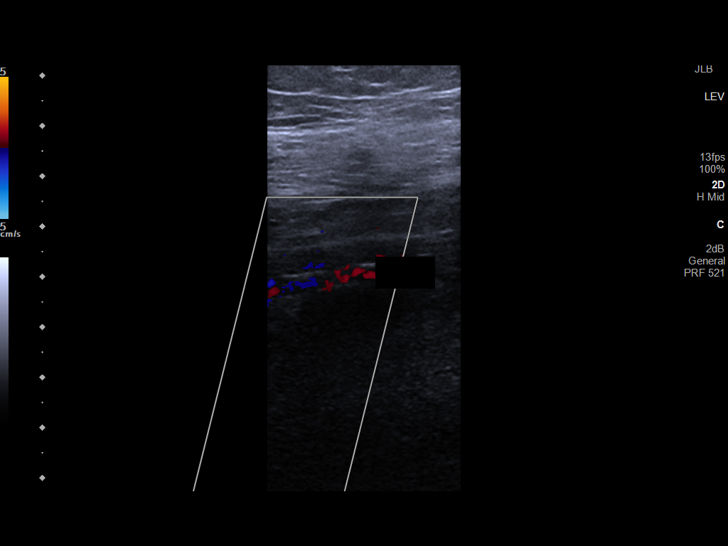
[im 29/29]
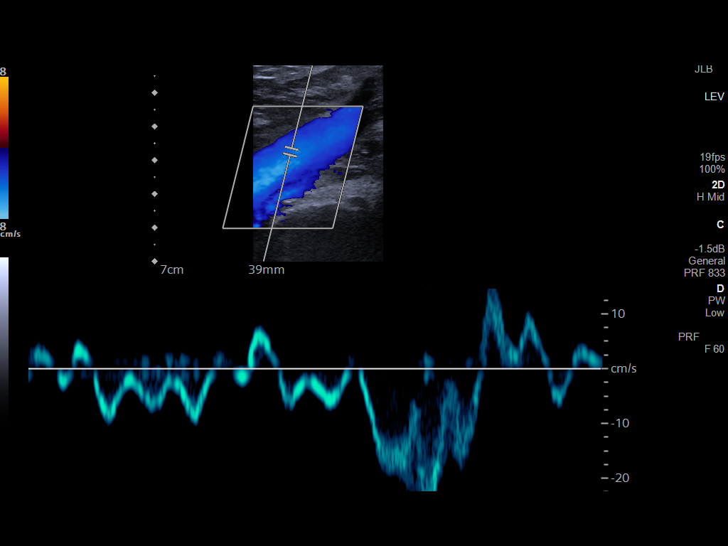

[13 of 24 positions shown; findings below may reference images not displayed]

FINDINGS: Contralateral Common Femoral Vein: Respiratory phasicity is normal
and symmetric with the symptomatic side. No evidence of thrombus.
Normal compressibility.

Common Femoral Vein: No evidence of thrombus. Normal
compressibility, respiratory phasicity and response to augmentation.

Saphenofemoral Junction: No evidence of thrombus. Normal
compressibility and flow on color Doppler imaging.

Profunda Femoral Vein: No evidence of thrombus. Normal
compressibility and flow on color Doppler imaging.

Femoral Vein: No evidence of thrombus. Normal compressibility,
respiratory phasicity and response to augmentation.

Popliteal Vein: Thrombus is noted with decreased compressibility.

Calf Veins: Thrombus is noted within the posterior tibial vein. The
peroneal vein is not well visualized.

Superficial Great Saphenous Vein: No evidence of thrombus. Normal
compressibility.

Venous Reflux:  None.

Other Findings:  None.
IMPRESSION: Thrombus is noted within the popliteal and posterior tibial veins.

## 2022-05-20 ENCOUNTER — Encounter (INDEPENDENT_AMBULATORY_CARE_PROVIDER_SITE_OTHER): Payer: Self-pay

## 2022-06-06 DIAGNOSIS — C541 Malignant neoplasm of endometrium: Secondary | ICD-10-CM | POA: Diagnosis not present

## 2022-06-06 DIAGNOSIS — R300 Dysuria: Secondary | ICD-10-CM | POA: Diagnosis not present

## 2022-06-07 ENCOUNTER — Ambulatory Visit: Payer: Medicare Other | Admitting: Internal Medicine

## 2022-06-10 ENCOUNTER — Other Ambulatory Visit: Payer: Self-pay | Admitting: Radiation Oncology

## 2022-06-10 ENCOUNTER — Ambulatory Visit
Admission: RE | Admit: 2022-06-10 | Discharge: 2022-06-10 | Disposition: A | Discharge status: Home / Self Care | Payer: Self-pay | Source: Ambulatory Visit | Attending: Radiation Oncology | Admitting: Radiation Oncology

## 2022-06-10 DIAGNOSIS — C541 Malignant neoplasm of endometrium: Secondary | ICD-10-CM

## 2022-06-10 DIAGNOSIS — R300 Dysuria: Secondary | ICD-10-CM | POA: Diagnosis not present

## 2022-06-11 NOTE — Progress Notes (Signed)
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   Sandra Phillips presented with symptoms of: bleeding  Biopsies revealed: 04/19/2022 Final Diagnosis  [facility]  1.  Endometrium, curettage: Endometrioid carcinoma, FIGO grade 1.   2.  Left sentinel lymph node, lymphadenectomy: 1 lymph node is identified and is negative for malignancy (0/1).   3.  Right sentinel lymph node, lymphadenectomy: 1 lymph node is identified and is negative for malignancy (0/1).   4.  Uterus, cervix, bilateral ovaries and fallopian tubes; hysterectomy with bilateral salpingo-oophorectomy: Endometrioid carcinoma, FIGO grade 1. Depth of invasion: 11 mm (out of total thickness 14 mm). Tumor invades into the outer half of myometrium. Negative for lymphovascular invasion. Stage: pT1b, pN0(sn).  FIGO IB. 2 small benign leiomyomata are identified (subserosal, intramural). Left ovary shows a benign serous cystadenoma, endometriosis and features consistent with torsion. See synoptic data and comment.               5.  Bladder biopsy: Benign blood vessel and adipose tissue. Negative for malignancy.   6.  Right diaphragm biopsy: Benign fibrous tissue showing mixed inflammation and chronic hemorrhage. Negative for malignancy.      Past/Anticipated interventions by Gyn/Onc surgery, if any: ROBOTIC HYSTERECTOMY BILATERAL SALPINGO OOPHORECTOMY AND POSSIBLE  SENTINEL LYMPH NODE DISSECTION 04/19/2022 by Dr. Polly Cobia   Past/Anticipated interventions by medical oncology, if any: none  Weight changes, if any: None in the last month.  Bowel/Bladder complaints, if any: Denies bowel changes.  She is currently on antibiotics for UTI.  Nausea/Vomiting, if any: No  Pain issues, if any:  No  Bleeding: None  SAFETY ISSUES: Prior radiation? No Pacemaker/ICD? No Possible current pregnancy? no Is the patient on methotrexate? No  Current Complaints / other  details:

## 2022-06-15 NOTE — Progress Notes (Signed)
Radiation Oncology         [phone] ________________________________  Initial Outpatient Consultation  Name: Sandra Phillips MRN: [id number]  Date: 06/17/2022  DOB: [birth year]  FF:MBWGY, Aris Everts, MD  Hart Rochester, MD   REFERRING PHYSICIAN: Hart Rochester, MD  DIAGNOSIS: The encounter diagnosis was Endometrial cancer [facility]).  Stage IB FIGO grade 1 endometrioid adenocarcinoma of the uterus with >60% myometrial invasion (negative for LVI); s/p hysterectomy, BSO, and lymphadenectomies (negative nodes)   HISTORY OF PRESENT ILLNESS::Sandra Phillips is a 71 y.o. female who is accompanied by her husband. she is seen as a courtesy of Dr, Polly Cobia for an opinion concerning radiation therapy as part of management for her recently diagnosed endometrial cancer. The patient presented to Dr. Garwin Brothers this past August 2023 with the cc of postmenopausal bleeding.   This prompted a transvaginal pelvic US on 03/12/22 which revealed a suspicious echogenic 6 cm mass within the endometrial canal. [name] also showed a uterus measuring 12.7 x 5.8 x 8.3 cm, and a tubular hypoechoic structure in the left adnexal region, likely separate from the ovary, possibly representing a distended fallopian tube.    CT of the abdomen and pelvis with contrast on 0925/23 redemonstrated the uterine mass measuring 4 cm. Differential considerations were noted to include endometrial carcinoma vs uterine fibroid. No extrauterine mass was appreciated.   The patient was accordingly referred to Dr. Polly Cobia [facility]) and opted to proceed with hysterectomy with BSO and nodal biopsies on 04/19/22.  Pathology from the procedure revealed FIGO grade 1 endometrioid carcinoma with >60% myometrial invasion (negative for LVI). Nodal status of 2/2 sentinel lymph nodes excised (1 right and 1 left) negative for malignancy. Prognostic indicators significant for: estrogen receptor 95% positive with moderate staining  intensity, and progesterone receptor 95% positive with strong staining intensity (the neoplasm retained nuclear expression of all 4 mismatch repair proteins) . Biopsies of the bladder and right hemidiaphragm were negative for malignancy.  Findings at the time of surgery also revealed evidence of left ovarian torsion with inflammatory changes in the pelvis.   Accordingly, the patient followed up with Dr. Polly Cobia on 04/25/22 to review pathology and discuss treatment options. Given her high to intermediate risk for vaginal cuff recurrence based on final pathology, Dr. Polly Cobia recommended her to proceed with a course of vaginal brachytherapy.     After she completes brachytherapy, she will begin surveillance visits with Dr. Polly Cobia starting with every 3 months.   Other pertinent imaging performed includes:  -- Bone density study on 04/03/22 showed a left femoral T-score of -2.9, classifying the patient as osteoporotic.  -- Bilateral screening mammogram on 04/03/22 showed no mammographic evidence of malignancy in either breast.    PREVIOUS RADIATION THERAPY: No  PAST MEDICAL HISTORY:  Past Medical History:  Diagnosis Date   COVID-19    12/13/21   Current long-term use of anticoagulant medication with history of deep venous thrombosis (DVT)    History of DVT (deep vein thrombosis)    Primary osteoarthritis     PAST SURGICAL HISTORY: Past Surgical History:  Procedure Laterality Date   COLONOSCOPY WITH PROPOFOL N/A 01/11/2022   Procedure: COLONOSCOPY WITH PROPOFOL;  Surgeon: Jonathon Bellows, MD;  Location: [facility] ENDOSCOPY;  Service: Gastroenterology;  Laterality: N/A;   KNEE ARTHROSCOPY Left 06/12/2021   Procedure: LEFT KNEE ARTHROSCOPY WITH DEBRIDEMENT AND REPAIR OF A POSTERIOR MEDIAL ROOT TEAR AND ABRASION CONDOPLASTY;  Surgeon: Corky Mull, MD;  Location: [facility] ORS;  Service:  Orthopedics;  Laterality: Left;   PULMONARY THROMBECTOMY N/A 07/09/2021   Procedure: PULMONARY THROMBECTOMY;  Surgeon: Algernon Huxley, MD;  Location: [facility] LAB;  Service: Cardiovascular;  Laterality: N/A;    FAMILY HISTORY:  Family History  Problem Relation Age of Onset   Breast cancer Mother 69       pt also has bone and bladder   Cancer Mother    Rheum arthritis Sister     SOCIAL HISTORY:  Social History   Tobacco Use   Smoking status: Never   Smokeless tobacco: Never  Vaping Use   Vaping Use: Never used  Substance Use Topics   Alcohol use: Yes    Alcohol/week: 1.0 standard drink of alcohol    Types: 1 Glasses of wine per week    Comment: a glass at dinner   Drug use: Never    ALLERGIES:  Allergies  Allergen Reactions   Penicillins     Unknown childhood reaction    Shrimp Extract Allergy Skin Test Swelling    Shrimp Shrimp    Shrimp Flavor Swelling    Shrimp    MEDICATIONS:  Current Outpatient Medications  Medication Sig Dispense Refill   acetaminophen (TYLENOL) 500 MG tablet Take by mouth.     alendronate (FOSAMAX) 70 MG tablet Take 1 tablet (70 mg total) by mouth every 7 (seven) days. Take with a full glass of water on an empty stomach. 4 tablet 11   ELIQUIS 5 MG TABS tablet TAKE ONE (1) TABLET BY MOUTH TWO TIMES PER DAY 60 tablet 6   ergocalciferol (DRISDOL) 1.25 MG (50000 UT) capsule Take 1 capsule (50,000 Units total) by mouth once a week. 12 capsule 0   metoprolol succinate (TOPROL-XL) 25 MG 24 hr tablet Take 1 tablet (25 mg total) by mouth daily. 90 tablet 3   nitrofurantoin, macrocrystal-monohydrate, (MACROBID) 100 MG capsule Take 100 mg by mouth 2 (two) times daily.     rosuvastatin (CRESTOR) 10 MG tablet Take 1 tablet (10 mg total) by mouth daily. 90 tablet 1   telmisartan (MICARDIS) 40 MG tablet Take 1 tablet (40 mg total) by mouth at bedtime. 90 tablet 1   No current facility-administered medications for this encounter.    REVIEW OF SYSTEMS:  A 10+ POINT REVIEW OF SYSTEMS WAS OBTAINED including neurology, dermatology, psychiatry, cardiac, respiratory, lymph,  extremities, GI, GU, musculoskeletal, constitutional, reproductive, HEENT.  She denies any pelvic pain vaginal bleeding or discharge.  She was recently diagnosed with urinary tract infection with urinary symptoms and has been placed on antibiotics.  Her symptoms have improved with this antibiotic (Macrobid)   PHYSICAL EXAM:  height is _0  (1.702 m) and weight is 222 lb (100.7 kg). Her oral temperature is 98.2 F (36.8 C). Her blood pressure is 162/90 (abnormal) and her pulse is 68. Her respiration is 18 and oxygen saturation is 100%.   General: Alert and oriented, in no acute distress HEENT: Head is normocephalic. Extraocular movements are intact.  Neck: Neck is supple, no palpable cervical or supraclavicular lymphadenopathy. Heart: Regular in rate and rhythm with no murmurs, rubs, or gallops. Chest: Clear to auscultation bilaterally, with no rhonchi, wheezes, or rales. Abdomen: Soft, nontender, nondistended, with no rigidity or guarding. Extremities: No cyanosis or edema. Lymphatics: see Neck Exam Skin: No concerning lesions. Musculoskeletal: symmetric strength and muscle tone throughout. Neurologic: Cranial nerves II through XII are grossly intact. No obvious focalities. Speech is fluent. Coordination is intact. Psychiatric: Judgment and insight are intact.  Affect is appropriate. Pelvic examination deferred until planning and simulation today   ECOG = 1  0 - Asymptomatic (Fully active, able to carry on all predisease activities without restriction)  1 - Symptomatic but completely ambulatory (Restricted in physically strenuous activity but ambulatory and able to carry out work of a light or sedentary nature. For example, light housework, office work)  2 - Symptomatic, <50% in bed during the day (Ambulatory and capable of all self care but unable to carry out any work activities. Up and about more than 50% of waking hours)  3 - Symptomatic, >50% in bed, but not bedbound (Capable of only  limited self-care, confined to bed or chair 50% or more of waking hours)  4 - Bedbound (Completely disabled. Cannot carry on any self-care. Totally confined to bed or chair)  5 - Death   Eustace Pen MM, Creech RH, Tormey DC, et al. [phone]). "Toxicity and response criteria of the [facility]". [place] Oncol. 5 (6): 649-55  LABORATORY DATA:  Lab Results  Component Value Date   WBC 7.6 04/10/2022   HGB 14.3 04/10/2022   HCT 41.9 04/10/2022   MCV 91.9 04/10/2022   PLT 212.0 04/10/2022   NEUTROABS 5.5 04/10/2022   Lab Results  Component Value Date   NA 139 05/06/2022   K 4.3 05/06/2022   CL 103 05/06/2022   CO2 29 05/06/2022   GLUCOSE 111 (H) 05/06/2022   BUN 12 05/06/2022   CREATININE 0.81 05/06/2022   CALCIUM 9.3 05/06/2022      RADIOGRAPHY: No results found.    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  Based on [place] data with her age greater than 71 and deep myometrial invasion she would qualify as high intermediate risk for recurrence and therefore would benefit from vaginal brachytherapy to reduce chances for recurrence in the proximal vagina.  Today, I talked to the patient and and her husband about the findings and work-up thus far.  We discussed the natural history of endometrial cancer and general treatment, highlighting the role of radiotherapy (vaginal brachytherapy) in the management.  We discussed the available radiation techniques, and focused on the details of logistics and delivery.  We reviewed the anticipated acute and late sequelae associated with radiation in this setting.  The patient was encouraged to ask questions that I answered to the best of my ability.  A patient consent form was discussed and signed.  We retained a copy for our records.  The  patient would like to proceed with radiation and will be scheduled for  CT simulation, planning and her first high-dose-rate treatment.  PLAN: She will be scheduled for her treatment soon as possible.  She is now approximately 8 weeks out from her surgery and is healed well per Dr. Shelba Flake recent exam.  Anticipate 5 high-dose-rate treatments directed at the vaginal cuff.  Iridium 192 will be the high-dose-rate source.   60 minutes of total time was spent for this patient encounter, including preparation, face-to-face counseling with the patient and coordination of care, physical exam, and documentation of the encounter.   ------------------------------------------------  Blair Promise, PhD, MD  This document serves as a record of services personally performed by Gery Pray, MD. It was created on his behalf by Roney Mans, a trained medical scribe. The creation of  this record is based on the scribe's personal observations and the provider's statements to them. This document has been checked and approved by the attending provider.

## 2022-06-17 ENCOUNTER — Telehealth: Payer: Self-pay | Admitting: *Deleted

## 2022-06-17 ENCOUNTER — Ambulatory Visit
Admission: RE | Admit: 2022-06-17 | Discharge: 2022-06-17 | Disposition: A | Discharge status: Home / Self Care | Payer: Medicare Other | Source: Ambulatory Visit | Attending: Radiation Oncology | Admitting: Radiation Oncology

## 2022-06-17 ENCOUNTER — Encounter: Payer: Self-pay | Admitting: Radiation Oncology

## 2022-06-17 ENCOUNTER — Other Ambulatory Visit: Payer: Self-pay

## 2022-06-17 VITALS — BP 162/90 | HR 68 | Temp 98.2°F | Resp 18 | Ht 67.0 in | Wt 222.0 lb

## 2022-06-17 DIAGNOSIS — Z803 Family history of malignant neoplasm of breast: Secondary | ICD-10-CM | POA: Insufficient documentation

## 2022-06-17 DIAGNOSIS — M1990 Unspecified osteoarthritis, unspecified site: Secondary | ICD-10-CM | POA: Insufficient documentation

## 2022-06-17 DIAGNOSIS — C541 Malignant neoplasm of endometrium: Secondary | ICD-10-CM | POA: Diagnosis not present

## 2022-06-17 DIAGNOSIS — M810 Age-related osteoporosis without current pathological fracture: Secondary | ICD-10-CM | POA: Insufficient documentation

## 2022-06-17 DIAGNOSIS — Z8616 Personal history of COVID-19: Secondary | ICD-10-CM | POA: Diagnosis not present

## 2022-06-17 DIAGNOSIS — Z8052 Family history of malignant neoplasm of bladder: Secondary | ICD-10-CM | POA: Diagnosis not present

## 2022-06-17 DIAGNOSIS — Z90722 Acquired absence of ovaries, bilateral: Secondary | ICD-10-CM | POA: Diagnosis not present

## 2022-06-17 DIAGNOSIS — Z79899 Other long term (current) drug therapy: Secondary | ICD-10-CM | POA: Diagnosis not present

## 2022-06-17 DIAGNOSIS — Z86718 Personal history of other venous thrombosis and embolism: Secondary | ICD-10-CM | POA: Insufficient documentation

## 2022-06-17 DIAGNOSIS — Z7901 Long term (current) use of anticoagulants: Secondary | ICD-10-CM | POA: Diagnosis not present

## 2022-06-17 DIAGNOSIS — Z90710 Acquired absence of both cervix and uterus: Secondary | ICD-10-CM | POA: Insufficient documentation

## 2022-06-17 NOTE — Telephone Encounter (Signed)
Called patient to inform of New HDR [facility], spoke with patient and she is aware of these appts. 

## 2022-06-25 ENCOUNTER — Telehealth: Payer: Self-pay | Admitting: *Deleted

## 2022-06-25 NOTE — Telephone Encounter (Signed)
CALLED PATIENT TO REMIND OF NEW HDR [place] FOR 06-26-22, SPOKE WITH PATIENT AND SHE IS AWARE OF THESE APPTS.

## 2022-06-25 NOTE — Progress Notes (Signed)
Radiation Oncology         (336) [phone] ________________________________  Name: Sandra Phillips MRN: [id number]  Date: 06/26/2022  DOB: Apr 11, 1951  Vaginal Brachytherapy Procedure Note  CC: Sherlene Shams, MD Seabron Spates, MD    ICD-10-CM   1. Endometrial cancer (HCC)  C54.1       Diagnosis: The encounter diagnosis was Endometrial cancer (HCC).   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  She denies any new medical issues.  She recently completed course of antibiotics for urinary tract infection (Macrobid).  She has some urinary frequency but no dysuria or hematuria at this time.    ALLERGIES: is allergic to penicillins, shrimp extract allergy skin test, and shrimp flavor.  Meds: Current Outpatient Medications  Medication Sig Dispense Refill   acetaminophen (TYLENOL) 500 MG tablet Take by mouth.     alendronate (FOSAMAX) 70 MG tablet Take 1 tablet (70 mg total) by mouth every 7 (seven) days. Take with a full glass of water on an empty stomach. 4 tablet 11   ELIQUIS 5 MG TABS tablet TAKE ONE (1) TABLET BY MOUTH TWO TIMES PER DAY 60 tablet 6   ergocalciferol (DRISDOL) 1.25 MG (50000 UT) capsule Take 1 capsule (50,000 Units total) by mouth once a week. 12 capsule 0   fluconazole (DIFLUCAN) 150 MG tablet Take 1 tablet (150 mg total) by mouth daily. Take 2nd tablet 72 hours later 2 tablet 0   metoprolol succinate (TOPROL-XL) 25 MG 24 hr tablet Take 1 tablet (25 mg total) by mouth daily. 90 tablet 3   rosuvastatin (CRESTOR) 10 MG tablet Take 1 tablet (10 mg total) by mouth daily. 90 tablet 1   telmisartan (MICARDIS) 40 MG tablet Take 1 tablet (40 mg total) by mouth at bedtime. 90 tablet 1   No current facility-administered medications for this encounter.    Physical Findings: The  patient is in no acute distress. Patient is alert and oriented.  height is 5\' 7"  (1.702 m) and weight is 223 lb 6 oz (101.3 kg). Her temporal temperature is 97.3 F (36.3 C) (abnormal). Her blood pressure is 152/73 (abnormal) and her pulse is 64. Her respiration is 18 and oxygen saturation is 99%.   No palpable cervical, supraclavicular or axillary lymphoadenopathy. The heart has a regular rate and rhythm. The lungs are clear to auscultation. Abdomen soft and non-tender.  On pelvic examination the external genitalia were unremarkable. A speculum exam was performed. Vaginal cuff intact, no mucosal lesions. On bimanual exam there were no pelvic masses appreciated.  In the proximal vagina the patient was noted to have significant white discharge consistent with yeast infection.  She denies use of any vaginal estrogen.  Lab Findings: Lab Results  Component Value Date   WBC 7.6 04/10/2022   HGB 14.3 04/10/2022   HCT 41.9 04/10/2022   MCV 91.9 04/10/2022   PLT 212.0 04/10/2022    Radiographic Findings: No results found.  Impression: The encounter diagnosis was Endometrial cancer (HCC).   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 The patient will be treated with a 2.5 cm diameter cylinder with a treatment length of 3.0 cm. This distended the vaginal vault without  undue discomfort. The patient tolerated the procedure well.  The patient was successfully fitted for a vaginal cylinder. The patient is appropriate to begin vaginal brachytherapy.   Plan: The patient will proceed with CT simulation and vaginal brachytherapy today.  She will be given a prescription for Diflucan for her yeast infection.  _______________________________   Billie Lade, PhD, MD  This document serves as a record of services personally  performed by Antony Blackbird, MD. It was created on his behalf by Neena Rhymes, a trained medical scribe. The creation of this record is based on the scribe's personal observations and the provider's statements to them. This document has been checked and approved by the attending provider.

## 2022-06-25 NOTE — Progress Notes (Signed)
  Radiation Oncology         (336) [phone] ________________________________  Name: Sandra Phillips MRN: [id number]  Date: 06/26/2022  DOB: Sep 20, 1950  CC: [name], MD  Hart Rochester, MD  HDR BRACHYTHERAPY NOTE  DIAGNOSIS: The encounter diagnosis was Endometrial cancer [facility]).   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 She will be treated with a 2.5 cm diameter segmented cylinder. This conforms to her anatomy without undue discomfort.  Vaginal brachytherapy procedure node: The patient was brought to the [place] suite. Identity was confirmed. All relevant records and images related to the planned course of therapy were reviewed. The patient freely provided informed written consent to proceed with treatment after reviewing the details related to the planned course of therapy. The consent form was witnessed and verified by the simulation staff. Then, the patient was set-up in a stable reproducible supine position for radiation therapy. Pelvic exam revealed the vaginal cuff to be intact . The patient's custom vaginal cylinder was placed in the proximal vagina. This was affixed to the CT/MR stabilization plate to prevent slippage. Patient tolerated the placement well.  Verification simulation note:  A fiducial marker was placed within the vaginal cylinder. An AP and lateral film was then obtained through the pelvis area. This documented accurate position of the vaginal cylinder for treatment.  HDR BRACHYTHERAPY TREATMENT  The remote afterloading device was affixed to the vaginal cylinder by catheter. Patient then proceeded to undergo her first high-dose-rate treatment directed at the proximal vagina. The patient was prescribed a dose of 6.0 gray to be delivered to the  mucosal surface. Treatment length was 3.0 cm. Patient was treated with 1 channel using 7 dwell positions. Treatment time was 213.6 seconds. Iridium 192 was the high-dose-rate source for treatment. The patient tolerated the treatment well. After completion of her therapy, a radiation survey was performed documenting return of the iridium source into the [employer] safe.   PLAN: The patient will return next week for her second high-dose-rate treatment.  ________________________________  -----------------------------------  Blair Promise, PhD, MD  This document serves as a record of services personally performed by Gery Pray, MD. It was created on his behalf by Roney Mans, a trained medical scribe. The creation of this record is based on the scribe's personal observations and the provider's statements to them. This document has been checked and approved by the attending provider.

## 2022-06-26 ENCOUNTER — Ambulatory Visit
Admission: RE | Admit: 2022-06-26 | Discharge: 2022-06-26 | Disposition: A | Discharge status: Home / Self Care | Payer: Medicare Other | Source: Ambulatory Visit | Attending: Radiation Oncology | Admitting: Radiation Oncology

## 2022-06-26 ENCOUNTER — Other Ambulatory Visit: Payer: Self-pay

## 2022-06-26 ENCOUNTER — Encounter: Payer: Self-pay | Admitting: Radiation Oncology

## 2022-06-26 DIAGNOSIS — C541 Malignant neoplasm of endometrium: Secondary | ICD-10-CM | POA: Diagnosis not present

## 2022-06-26 DIAGNOSIS — Z7901 Long term (current) use of anticoagulants: Secondary | ICD-10-CM | POA: Diagnosis not present

## 2022-06-26 DIAGNOSIS — Z79899 Other long term (current) drug therapy: Secondary | ICD-10-CM | POA: Diagnosis not present

## 2022-06-26 DIAGNOSIS — N390 Urinary tract infection, site not specified: Secondary | ICD-10-CM | POA: Diagnosis not present

## 2022-06-26 LAB — RAD ONC ARIA SESSION SUMMARY
Course Elapsed Days: 0
Plan Fractions Treated to Date: 1
Plan Prescribed Dose Per Fraction: 6 Gy
Plan Total Fractions Prescribed: 5
Plan Total Prescribed Dose: 30 Gy
Reference Point Dosage Given to Date: 6 Gy
Reference Point Session Dosage Given: 6 Gy
Session Number: 1

## 2022-06-26 MED ORDER — FLUCONAZOLE 150 MG PO TABS: 150.0000 mg | ORAL_TABLET | Freq: Every day | ORAL | 0 refills | Status: DC

## 2022-06-26 NOTE — Progress Notes (Signed)
Sandra Phillips is here today for vaginal cylinder fitting.    Does the patient complain of any of the following:  Pain:No Abdominal bloating: No Diarrhea/Constipation: No Nausea/Vomiting: No Vaginal Discharge: No Blood in Urine or Stool: No Urinary Issues (dysuria/incomplete emptying/ incontinence/ increased frequency/urgency): Yes, frequency, Patient recently completed Macrobid for UTI.   Additional comments if applicable:     BP (!) 441/71 (BP Location: Left Arm, Patient Position: Sitting)   Pulse 64   Temp (!) 97.3 F (36.3 C) (Temporal)   Resp 18   Ht '5\' 7"'$  (1.702 m)   Wt 223 lb 6 oz (101.3 kg)   SpO2 99%   BMI 34.99 kg/m

## 2022-06-28 ENCOUNTER — Telehealth: Payer: Self-pay | Admitting: *Deleted

## 2022-06-28 NOTE — Telephone Encounter (Signed)
CALLED PATIENT TO REMIND OF HDR [place]. FOR 07-01-22 @ 10 AM, SPOKE WITH PATIENT AND SHE IS AWARE OF THIS Sandra Phillips.

## 2022-06-30 NOTE — Progress Notes (Signed)
  Radiation Oncology         [phone] ________________________________  Name: Sandra Phillips MRN: [id number]  Date: 07/01/2022  DOB: 06/10/1951  CC: [name], MD  Hart Rochester, MD  HDR BRACHYTHERAPY NOTE  DIAGNOSIS: The encounter diagnosis was Endometrial cancer [facility]).   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 She will be treated with a 2.5 cm diameter segmented cylinder. This conforms to her anatomy without undue discomfort.  Vaginal brachytherapy procedure node: The patient was brought to the [facility] suite. Identity was confirmed. All relevant records and images related to the planned course of therapy were reviewed. The patient freely provided informed written consent to proceed with treatment after reviewing the details related to the planned course of therapy. The consent form was witnessed and verified by the simulation staff. Then, the patient was set-up in a stable reproducible supine position for radiation therapy. Pelvic exam revealed the vaginal cuff to be intact . The patient's custom vaginal cylinder was placed in the proximal vagina. This was affixed to the CT/MR stabilization plate to prevent slippage. Patient tolerated the placement well.  Verification simulation note:  A fiducial marker was placed within the vaginal cylinder. An AP and lateral film was then obtained through the pelvis area. This documented accurate position of the vaginal cylinder for treatment.  HDR BRACHYTHERAPY TREATMENT  The remote afterloading device was affixed to the vaginal cylinder by catheter. Patient then proceeded to undergo her second high-dose-rate treatment directed at the proximal vagina. The patient was prescribed a dose of 6.0 gray to be delivered to the  mucosal surface. Treatment length was 3.0 cm. Patient was treated with 1 channel using 7 dwell positions. Treatment time was 223.9 seconds. Iridium 192 was the high-dose-rate source for treatment. The patient tolerated the treatment well. After completion of her therapy, a radiation survey was performed documenting return of the iridium source into the GammaMed safe.   PLAN: The patient will return later this week for her third high-dose-rate treatment.  ________________________________  -----------------------------------  Blair Promise, PhD, MD  This document serves as a record of services personally performed by Gery Pray, MD. It was created on his behalf by Roney Mans, a trained medical scribe. The creation of this record is based on the scribe's personal observations and the provider's statements to them. This document has been checked and approved by the attending provider.

## 2022-07-01 ENCOUNTER — Ambulatory Visit
Admission: RE | Admit: 2022-07-01 | Discharge: 2022-07-01 | Disposition: A | Discharge status: Home / Self Care | Payer: Medicare Other | Source: Ambulatory Visit | Attending: Radiation Oncology | Admitting: Radiation Oncology

## 2022-07-01 ENCOUNTER — Other Ambulatory Visit: Payer: Self-pay

## 2022-07-01 DIAGNOSIS — C541 Malignant neoplasm of endometrium: Secondary | ICD-10-CM

## 2022-07-01 LAB — RAD ONC ARIA SESSION SUMMARY
Course Elapsed Days: 5
Plan Fractions Treated to Date: 2
Plan Prescribed Dose Per Fraction: 6 Gy
Plan Total Fractions Prescribed: 5
Plan Total Prescribed Dose: 30 Gy
Reference Point Dosage Given to Date: 12 Gy
Reference Point Session Dosage Given: 6 Gy
Session Number: 2

## 2022-07-02 ENCOUNTER — Telehealth: Payer: Self-pay | Admitting: *Deleted

## 2022-07-02 NOTE — Progress Notes (Signed)
  Radiation Oncology         [phone] ________________________________  Name: Sandra Phillips MRN: [id number]  Date: 07/03/2022  DOB: [birth year]  CC: [name], MD  Hart Rochester, MD  HDR BRACHYTHERAPY NOTE  DIAGNOSIS: The encounter diagnosis was Endometrial cancer [facility]).   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 She will be treated with a 2.5 cm diameter segmented cylinder. This conforms to her anatomy without undue discomfort.  Vaginal brachytherapy procedure node: The patient was brought to the [place] suite. Identity was confirmed. All relevant records and images related to the planned course of therapy were reviewed. The patient freely provided informed written consent to proceed with treatment after reviewing the details related to the planned course of therapy. The consent form was witnessed and verified by the simulation staff. Then, the patient was set-up in a stable reproducible supine position for radiation therapy. Pelvic exam revealed the vaginal cuff to be intact . The patient's custom vaginal cylinder was placed in the proximal vagina. This was affixed to the CT/MR stabilization plate to prevent slippage. Patient tolerated the placement well.  Verification simulation note:  A fiducial marker was placed within the vaginal cylinder. An AP and lateral film was then obtained through the pelvis area. This documented accurate position of the vaginal cylinder for treatment.  HDR BRACHYTHERAPY TREATMENT  The remote afterloading device was affixed to the vaginal cylinder by catheter. Patient then proceeded to undergo her third high-dose-rate treatment directed at the proximal vagina. The patient was prescribed a dose of 6.0 gray to be delivered to the  mucosal surface. Treatment length was 3.0 cm. Patient was treated with 1 channel using 7 dwell positions. Treatment time was 228.1 seconds. Iridium 192 was the high-dose-rate source for treatment. The patient tolerated the treatment well. After completion of her therapy, a radiation survey was performed documenting return of the iridium source into the GammaMed safe.   PLAN: The patient will return early next week for her fourth high-dose-rate treatment.  ________________________________  -----------------------------------  Blair Promise, PhD, MD  This document serves as a record of services personally performed by Gery Pray, MD. It was created on his behalf by Roney Mans, a trained medical scribe. The creation of this record is based on the scribe's personal observations and the provider's statements to them. This document has been checked and approved by the attending provider.

## 2022-07-02 NOTE — Telephone Encounter (Signed)
CALLED PATIENT TO REMIND OF HDR TX. FOR 07-03-22 @ 2 PM, SPOKE WITH PATIENT AND SHE IS AWARE OF THIS [place].

## 2022-07-03 ENCOUNTER — Ambulatory Visit
Admission: RE | Admit: 2022-07-03 | Discharge: 2022-07-03 | Disposition: A | Discharge status: Home / Self Care | Payer: Medicare Other | Source: Ambulatory Visit | Attending: Radiation Oncology | Admitting: Radiation Oncology

## 2022-07-03 ENCOUNTER — Other Ambulatory Visit: Payer: Self-pay

## 2022-07-03 DIAGNOSIS — C541 Malignant neoplasm of endometrium: Secondary | ICD-10-CM

## 2022-07-03 LAB — RAD ONC ARIA SESSION SUMMARY
Course Elapsed Days: 7
Plan Fractions Treated to Date: 3
Plan Prescribed Dose Per Fraction: 6 Gy
Plan Total Fractions Prescribed: 5
Plan Total Prescribed Dose: 30 Gy
Reference Point Dosage Given to Date: 18 Gy
Reference Point Session Dosage Given: 6 Gy
Session Number: 3

## 2022-07-05 ENCOUNTER — Telehealth: Payer: Self-pay | Admitting: *Deleted

## 2022-07-05 NOTE — Telephone Encounter (Signed)
CALLED PATIENT TO REMIND OF HDR TX. FOR 07-08-22 @ 9 AM, SPOKE WITH PATIENT AND SHE IS AWARE OF THIS [place].

## 2022-07-06 NOTE — Progress Notes (Signed)
  Radiation Oncology         [phone] ________________________________  Name: Sandra Phillips MRN: [id number]  Date: 07/08/2022  DOB: 05-29-1951  CC: [name], MD  Hart Rochester, MD  HDR BRACHYTHERAPY NOTE  DIAGNOSIS: The encounter diagnosis was Endometrial cancer [facility]).   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 She will be treated with a 2.5 cm diameter segmented cylinder. This conforms to her anatomy without undue discomfort.  Vaginal brachytherapy procedure node: The patient was brought to the [place] suite. Identity was confirmed. All relevant records and images related to the planned course of therapy were reviewed. The patient freely provided informed written consent to proceed with treatment after reviewing the details related to the planned course of therapy. The consent form was witnessed and verified by the simulation staff. Then, the patient was set-up in a stable reproducible supine position for radiation therapy. Pelvic exam revealed the vaginal cuff to be intact . The patient's custom vaginal cylinder was placed in the proximal vagina. This was affixed to the CT/MR stabilization plate to prevent slippage. Patient tolerated the placement well.  Verification simulation note:  A fiducial marker was placed within the vaginal cylinder. An AP and lateral film was then obtained through the pelvis area. This documented accurate position of the vaginal cylinder for treatment.  HDR BRACHYTHERAPY TREATMENT  The remote afterloading device was affixed to the vaginal cylinder by catheter. Patient then proceeded to undergo her fourth high-dose-rate treatment directed at the proximal vagina. The patient was prescribed a dose of 6.0 gray to be delivered to the  mucosal surface. Treatment length was 3.0 cm. Patient was treated with 1 channel using 7 dwell positions. Treatment time was 239.0 seconds. Iridium 192 was the high-dose-rate source for treatment. The patient tolerated the treatment well. After completion of her therapy, a radiation survey was performed documenting return of the iridium source into the GammaMed safe.   PLAN: The patient will return later this week for her fifth high-dose-rate treatment.  ________________________________  -----------------------------------  Blair Promise, PhD, MD  This document serves as a record of services personally performed by Gery Pray, MD. It was created on his behalf by Roney Mans, a trained medical scribe. The creation of this record is based on the scribe's personal observations and the provider's statements to them. This document has been checked and approved by the attending provider.

## 2022-07-08 ENCOUNTER — Ambulatory Visit
Admission: RE | Admit: 2022-07-08 | Discharge: 2022-07-08 | Disposition: A | Discharge status: Home / Self Care | Payer: Medicare Other | Source: Ambulatory Visit | Attending: Radiation Oncology | Admitting: Radiation Oncology

## 2022-07-08 ENCOUNTER — Other Ambulatory Visit: Payer: Self-pay

## 2022-07-08 DIAGNOSIS — C541 Malignant neoplasm of endometrium: Secondary | ICD-10-CM | POA: Diagnosis not present

## 2022-07-09 ENCOUNTER — Telehealth: Payer: Self-pay | Admitting: *Deleted

## 2022-07-09 NOTE — Progress Notes (Signed)
  Radiation Oncology         [phone] ________________________________  Name: QUYNN VILCHIS MRN: [id number]  Date: 07/10/2022  DOB: 1951/06/08  CC: [name], MD  Hart Rochester, MD  HDR BRACHYTHERAPY NOTE  DIAGNOSIS: The encounter diagnosis was Endometrial cancer [facility]).   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 She will be treated with a 2.5 cm diameter segmented cylinder. This conforms to her anatomy without undue discomfort.  Vaginal brachytherapy procedure node: The patient was brought to the [place] suite. Identity was confirmed. All relevant records and images related to the planned course of therapy were reviewed. The patient freely provided informed written consent to proceed with treatment after reviewing the details related to the planned course of therapy. The consent form was witnessed and verified by the simulation staff. Then, the patient was set-up in a stable reproducible supine position for radiation therapy. Pelvic exam revealed the vaginal cuff to be intact . The patient's custom vaginal cylinder was placed in the proximal vagina. This was affixed to the CT/MR stabilization plate to prevent slippage. Patient tolerated the placement well.  Verification simulation note:  A fiducial marker was placed within the vaginal cylinder. An AP and lateral film was then obtained through the pelvis area. This documented accurate position of the vaginal cylinder for treatment.  HDR BRACHYTHERAPY TREATMENT  The remote afterloading device was affixed to the vaginal cylinder by catheter. Patient then proceeded to undergo her fifth high-dose-rate treatment directed at the proximal vagina. The patient was prescribed a dose of 6.0 gray to be delivered to the  mucosal surface. Treatment length was 3.0 cm. Patient was treated with 1 channel using 7 dwell positions. Treatment time was 243.6 seconds. Iridium 192 was the high-dose-rate source for treatment. The patient tolerated the treatment well. After completion of her therapy, a radiation survey was performed documenting return of the iridium source into the GammaMed safe.   PLAN: The patient has completed her fifth and final high-dose-rate treatment. She tolerated treatment well without any significant side effects . She will return for routine follow-up on one month.   ________________________________  -----------------------------------  Blair Promise, PhD, MD  This document serves as a record of services personally performed by Gery Pray, MD. It was created on his behalf by Roney Mans, a trained medical scribe. The creation of this record is based on the scribe's personal observations and the provider's statements to them. This document has been checked and approved by the attending provider.

## 2022-07-09 NOTE — Telephone Encounter (Signed)
CALLED PATIENT TO REMIND OF HDR TX. FOR 07-10-22 @ 9 AM, SPOKE WITH PATIENT AND SHE IS AWARE OF THIS [place].

## 2022-07-10 ENCOUNTER — Ambulatory Visit
Admission: RE | Admit: 2022-07-10 | Discharge: 2022-07-10 | Disposition: A | Discharge status: Home / Self Care | Payer: Medicare Other | Source: Ambulatory Visit | Attending: Radiation Oncology | Admitting: Radiation Oncology

## 2022-07-10 ENCOUNTER — Other Ambulatory Visit: Payer: Self-pay

## 2022-07-10 DIAGNOSIS — C541 Malignant neoplasm of endometrium: Secondary | ICD-10-CM

## 2022-07-10 LAB — RAD ONC ARIA SESSION SUMMARY
Course Elapsed Days: 14
Plan Fractions Treated to Date: 5
Plan Prescribed Dose Per Fraction: 6 Gy
Plan Total Fractions Prescribed: 5
Plan Total Prescribed Dose: 30 Gy
Reference Point Dosage Given to Date: 30 Gy
Reference Point Session Dosage Given: 12 Gy
Session Number: 5

## 2022-07-12 ENCOUNTER — Ambulatory Visit
Admission: EM | Admit: 2022-07-12 | Discharge: 2022-07-12 | Disposition: A | Discharge status: Home / Self Care | Payer: Medicare Other | Attending: Urgent Care | Admitting: Urgent Care

## 2022-07-12 DIAGNOSIS — R300 Dysuria: Secondary | ICD-10-CM | POA: Insufficient documentation

## 2022-07-12 DIAGNOSIS — N3001 Acute cystitis with hematuria: Secondary | ICD-10-CM | POA: Insufficient documentation

## 2022-07-12 LAB — POCT URINALYSIS DIP (MANUAL ENTRY)
Bilirubin, UA: NEGATIVE
Glucose, UA: NEGATIVE mg/dL
Ketones, POC UA: NEGATIVE mg/dL
Nitrite, UA: NEGATIVE
Protein Ur, POC: 30 mg/dL — AB
Spec Grav, UA: 1.015 (ref 1.010–1.025)
Urobilinogen, UA: 0.2 E.U./dL
pH, UA: 5 (ref 5.0–8.0)

## 2022-07-12 MED ORDER — SULFAMETHOXAZOLE-TRIMETHOPRIM 800-160 MG PO TABS: 1.0000 | ORAL_TABLET | Freq: Two times a day (BID) | ORAL | 0 refills | Status: AC

## 2022-07-12 NOTE — ED Triage Notes (Signed)
Pt. Presents to UC w/ c/o urinary frequency and urgency since last night.

## 2022-07-12 NOTE — Discharge Instructions (Signed)
Follow up here or with your primary care provider if your symptoms are worsening or not improving.     

## 2022-07-12 NOTE — ED Provider Notes (Signed)
[facility]    CSN: [id number] Arrival date & time: 07/12/22  0847      History   Chief Complaint Chief Complaint  Patient presents with   Urinary Frequency    HPI Sandra Phillips is a 71 y.o. female.    Urinary Frequency    Presents to urgent care with complaint of urinary frequency and urgency starting last night.  She denies fever, chills, body aches.  Denies abdominal pain.  Denies flank pain.  She endorses recent treatment for UTI with Macrobid. Past Medical History:  Diagnosis Date   COVID-19    12/13/21   Current long-term use of anticoagulant medication with history of deep venous thrombosis (DVT)    History of DVT (deep vein thrombosis)    Primary osteoarthritis     Patient Active Problem List   Diagnosis Date Noted   Vitamin D deficiency 05/08/2022   Essential hypertension 04/29/2022   Preoperative evaluation of a medical condition to rule out surgical contraindications (TAR required) 04/10/2022   Osteoporosis 04/05/2022   Endometrial cancer ([place]) 03/13/2022   Encounter for preventive health examination 01/05/2022   Aortic atherosclerosis ([place]) 12/08/2021   Obesity (BMI 35.0-39.9 without comorbidity) 12/08/2021   History of pulmonary embolism 07/07/2021   Sinus tachycardia 07/07/2021   Primary osteoarthritis 07/07/2021   History of deep venous thrombosis (DVT) of distal vein of left lower extremity 07/07/2021    Past Surgical History:  Procedure Laterality Date   COLONOSCOPY WITH PROPOFOL N/A 01/11/2022   Procedure: COLONOSCOPY WITH PROPOFOL;  Surgeon: Jonathon Bellows, MD;  Location[facility] ENDOSCOPY;  Service: Gastroenterology;  Laterality: N/A;   KNEE ARTHROSCOPY Left 06/12/2021   Procedure: LEFT KNEE ARTHROSCOPY WITH DEBRIDEMENT AND REPAIR OF A POSTERIOR MEDIAL ROOT TEAR AND ABRASION CONDOPLASTY;  Surgeon: Corky Mull, MD;  Location: [facility] ORS;  Service: Orthopedics;  Laterality: Left;   PULMONARY THROMBECTOMY N/A 07/09/2021   Procedure:  PULMONARY THROMBECTOMY;  Surgeon: Algernon Huxley, MD;  Location: [place] CV LAB;  Service: Cardiovascular;  Laterality: N/A;    OB History   No obstetric history on file.      Home Medications    Prior to Admission medications   Medication Sig Start Date End Date Taking? Authorizing Provider  acetaminophen (TYLENOL) 500 MG tablet Take by mouth.    [provider]  alendronate (FOSAMAX) 70 MG tablet Take 1 tablet (70 mg total) by mouth every 7 (seven) days. Take with a full glass of water on an empty stomach. 04/29/22   [name], MD  ELIQUIS 5 MG TABS tablet TAKE ONE (1) TABLET BY MOUTH TWO TIMES PER DAY 03/19/22   Algernon Huxley, MD  ergocalciferol (DRISDOL) 1.25 MG (50000 UT) capsule Take 1 capsule (50,000 Units total) by mouth once a week. 05/08/22   [name], MD  fluconazole (DIFLUCAN) 150 MG tablet Take 1 tablet (150 mg total) by mouth daily. Take 2nd tablet 72 hours later 06/26/22   Gery Pray, MD  metoprolol succinate (TOPROL-XL) 25 MG 24 hr tablet Take 1 tablet (25 mg total) by mouth daily. 04/10/22   [name], MD  rosuvastatin (CRESTOR) 10 MG tablet Take 1 tablet (10 mg total) by mouth daily. 03/04/22   [name], MD  telmisartan (MICARDIS) 40 MG tablet Take 1 tablet (40 mg total) by mouth at bedtime. 04/29/22   [name], MD    Family History Family History  Problem Relation Age of Onset   Breast cancer  Mother 84       pt also has bone and bladder   Cancer Mother    Rheum arthritis Sister     Social History Social History   Tobacco Use   Smoking status: Never   Smokeless tobacco: Never  Vaping Use   Vaping Use: Never used  Substance Use Topics   Alcohol use: Yes    Alcohol/week: 1.0 standard drink of alcohol    Types: 1 Glasses of wine per week    Comment: a glass at dinner   Drug use: Never     Allergies   Penicillins, Shrimp extract allergy skin test, and Shrimp flavor   Review of Systems Review of Systems   Genitourinary:  Positive for frequency.     Physical Exam Triage Vital Signs ED Triage Vitals  Enc Vitals Group     BP 07/12/22 0941 (!) 149/82     Pulse Rate 07/12/22 0941 76     Resp 07/12/22 0941 17     Temp 07/12/22 0941 97.9 F (36.6 C)     Temp src --      SpO2 07/12/22 0941 98 %     Weight --      Height --      Head Circumference --      Peak Flow --      Pain Score 07/12/22 0942 0     Pain Loc --      Pain Edu? --      Excl. in [place]? --    No data found.  Updated Vital Signs BP (!) 149/82   Pulse 76   Temp 97.9 F (36.6 C)   Resp 17   SpO2 98%   Visual Acuity Right Eye Distance:   Left Eye Distance:   Bilateral Distance:    Right Eye Near:   Left Eye Near:    Bilateral Near:     Physical Exam Vitals reviewed.  Constitutional:      Appearance: Normal appearance.  Skin:    General: Skin is warm and dry.  Neurological:     General: No focal deficit present.     Mental Status: She is alert and oriented to person, place, and time.  Psychiatric:        Mood and Affect: Mood normal.        Behavior: Behavior normal.      UC Treatments / Results  Labs (all labs ordered are listed, but only abnormal results are displayed) Labs Reviewed  POCT URINALYSIS DIP (MANUAL ENTRY)    EKG   Radiology No results found.  Procedures Procedures (including critical care time)  Medications Ordered in UC Medications - No data to display  Initial Impression / Assessment and Plan / UC Course  I have reviewed the triage vital signs and the nursing notes.  Pertinent labs & imaging results that were available during my care of the patient were reviewed by me and considered in my medical decision making (see chart for details).   UA indicates small leukocytes and moderate blood. Will treat for acute cystitis with hematuria with bactrim sending for culture to confirm susceptibility given recurrence.  Final Clinical Impressions(s) / UC Diagnoses   Final  diagnoses:  None   Discharge Instructions   None    ED Prescriptions   None    PDMP not reviewed this encounter.   Rose Phi,  07/12/22 1002

## 2022-07-13 LAB — URINE CULTURE: Culture: 40000 — AB

## 2022-07-24 ENCOUNTER — Ambulatory Visit: Payer: Medicare Other | Admitting: Radiation Oncology

## 2022-07-25 ENCOUNTER — Other Ambulatory Visit: Payer: Self-pay | Admitting: Internal Medicine

## 2022-07-29 ENCOUNTER — Ambulatory Visit: Payer: Medicare Other | Admitting: Radiation Oncology

## 2022-08-01 ENCOUNTER — Encounter: Payer: Self-pay | Admitting: Internal Medicine

## 2022-08-01 ENCOUNTER — Ambulatory Visit (INDEPENDENT_AMBULATORY_CARE_PROVIDER_SITE_OTHER): Payer: Medicare Other | Admitting: Internal Medicine

## 2022-08-01 VITALS — BP 118/78 | HR 79 | Temp 98.0°F | Ht 67.0 in | Wt 228.8 lb

## 2022-08-01 DIAGNOSIS — C541 Malignant neoplasm of endometrium: Secondary | ICD-10-CM

## 2022-08-01 DIAGNOSIS — N390 Urinary tract infection, site not specified: Secondary | ICD-10-CM

## 2022-08-01 DIAGNOSIS — I10 Essential (primary) hypertension: Secondary | ICD-10-CM

## 2022-08-01 DIAGNOSIS — E559 Vitamin D deficiency, unspecified: Secondary | ICD-10-CM

## 2022-08-01 DIAGNOSIS — R300 Dysuria: Secondary | ICD-10-CM

## 2022-08-01 DIAGNOSIS — E782 Mixed hyperlipidemia: Secondary | ICD-10-CM | POA: Diagnosis not present

## 2022-08-01 DIAGNOSIS — Z86711 Personal history of pulmonary embolism: Secondary | ICD-10-CM

## 2022-08-01 LAB — COMPREHENSIVE METABOLIC PANEL
ALT: 27 U/L (ref 0–35)
AST: 21 U/L (ref 0–37)
Albumin: 4.1 g/dL (ref 3.5–5.2)
Alkaline Phosphatase: 81 U/L (ref 39–117)
BUN: 12 mg/dL (ref 6–23)
CO2: 27 mEq/L (ref 19–32)
Calcium: 9.2 mg/dL (ref 8.4–10.5)
Chloride: 107 mEq/L (ref 96–112)
Creatinine, Ser: 0.89 mg/dL (ref 0.40–1.20)
GFR: 66.27 mL/min (ref >60.00)
Glucose, Bld: 112 mg/dL — ABNORMAL HIGH (ref 70–99)
Potassium: 4.5 mEq/L (ref 3.5–5.1)
Sodium: 141 mEq/L (ref 135–145)
Total Bilirubin: 0.5 mg/dL (ref 0.2–1.2)
Total Protein: 6.9 g/dL (ref 6.0–8.3)

## 2022-08-01 LAB — VITAMIN D 25 HYDROXY (VIT D DEFICIENCY, FRACTURES): VITD: 29.79 ng/mL — ABNORMAL LOW (ref 30.00–100.00)

## 2022-08-01 NOTE — Patient Instructions (Addendum)
You might want to try using Relaxium for insomnia  (as seen on TV commercials) . It is available through [employer] and contains all natural supplements:  Melatonin 5 mg  Chamomile 25 mg Passionflower extract 75 mg GABA 100 mg Ashwaganda extract 125 mg Magnesium citrate, glycinate, oxide (100 mg)  L tryptophan 500 mg Valerest (proprietary  ingredient ; probably valeria root extract)    Continue Eliquis  for now  Reschedule your colonoscopy with Dr Vicente Males.   Remind  them of Eliquis use   Blood pressure is excellent .  Continue metoprolol and telmisartan  Your last Urine culture in December was NEGATIVE for infection.  The symptoms may be from menopause.  Please call our office next time you have symptoms and we will run the test

## 2022-08-01 NOTE — Progress Notes (Signed)
Subjective:  Patient ID: Sandra Phillips, female    DOB: [birth year]  Age: 72 y.o. MRN: [id number]  CC: The primary encounter diagnosis was Dysuria. Diagnoses of Mixed hyperlipidemia, Vitamin D deficiency, Endometrial cancer ([place]), Essential hypertension, History of pulmonary embolism, and Recurrent UTI were also pertinent to this visit.   HPI Sandra Phillips presents for  Chief Complaint  Patient presents with   Medical Management of Chronic Issues   Follow-up    3 mth f/u   1) Endometrial CA :  she recently completed  treatment   2) insomnia  3) Aortic arch atherosclerosis:  Reviewed findings of prior CT scan today..  Patient  Is no longer taking a statin.  Discussed the role of statin therapy in stablizing placque and preventing events.    4) Hypertension: patient checks blood pressure twice weekly at home.  Readings have been for the most part < 130/80 at rest . Patient is following a reduce salt diet most days and is taking medications as prescribed   5) Recurrent UTI:  reviewed last several urine cultures with patient and the symptoms she has been having   Outpatient Medications Prior to Visit  Medication Sig Dispense Refill   acetaminophen (TYLENOL) 500 MG tablet Take by mouth.     alendronate (FOSAMAX) 70 MG tablet Take 1 tablet (70 mg total) by mouth every 7 (seven) days. Take with a full glass of water on an empty stomach. 4 tablet 11   ELIQUIS 5 MG TABS tablet TAKE ONE (1) TABLET BY MOUTH TWO TIMES PER DAY 60 tablet 6   metoprolol succinate (TOPROL-XL) 25 MG 24 hr tablet Take 1 tablet (25 mg total) by mouth daily. 90 tablet 3   rosuvastatin (CRESTOR) 10 MG tablet Take 1 tablet (10 mg total) by mouth daily. 90 tablet 1   telmisartan (MICARDIS) 40 MG tablet TAKE ONE TABLET BY MOUTH AT BEDTIME 90 tablet 1   ergocalciferol (DRISDOL) 1.25 MG (50000 UT) capsule Take 1 capsule (50,000 Units total) by mouth once a week. (Patient not taking: Reported on 08/01/2022) 12 capsule 0    fluconazole (DIFLUCAN) 150 MG tablet Take 1 tablet (150 mg total) by mouth daily. Take 2nd tablet 72 hours later (Patient not taking: Reported on 08/01/2022) 2 tablet 0   No facility-administered medications prior to visit.    Review of Systems;  Patient denies headache, fevers, malaise, unintentional weight loss, skin rash, eye pain, sinus congestion and sinus pain, sore throat, dysphagia,  hemoptysis , cough, dyspnea, wheezing, chest pain, palpitations, orthopnea, edema, abdominal pain, nausea, melena, diarrhea, constipation, flank pain, dysuria, hematuria, urinary  Frequency, nocturia, numbness, tingling, seizures,  Focal weakness, Loss of consciousness,  Tremor, insomnia, depression, anxiety, and suicidal ideation.      Objective:  BP 118/78   Pulse 79   Temp 98 F (36.7 C) (Oral)   Ht '5\' 7"'$  (1.702 m)   Wt 228 lb 12.8 oz (103.8 kg)   SpO2 98%   BMI 35.84 kg/m   BP Readings from Last 3 Encounters:  08/02/22 130/72  08/01/22 118/78  07/12/22 (!) 149/82    Wt Readings from Last 3 Encounters:  08/02/22 228 lb 3.2 oz (103.5 kg)  08/01/22 228 lb 12.8 oz (103.8 kg)  06/26/22 223 lb 6 oz (101.3 kg)    Physical Exam Vitals reviewed.  Constitutional:      General: She is not in acute distress.    Appearance: Normal appearance. She is normal weight. She is not  ill-appearing, toxic-appearing or diaphoretic.  HENT:     Head: Normocephalic.  Eyes:     General: No scleral icterus.       Right eye: No discharge.        Left eye: No discharge.     Conjunctiva/sclera: Conjunctivae normal.  Cardiovascular:     Rate and Rhythm: Normal rate and regular rhythm.     Heart sounds: Normal heart sounds.  Pulmonary:     Effort: Pulmonary effort is normal. No respiratory distress.     Breath sounds: Normal breath sounds.  Musculoskeletal:        General: Normal range of motion.  Skin:    General: Skin is warm and dry.  Neurological:     General: No focal deficit present.     Mental  Status: She is alert and oriented to person, place, and time. Mental status is at baseline.  Psychiatric:        Mood and Affect: Mood normal.        Behavior: Behavior normal.        Thought Content: Thought content normal.        Judgment: Judgment normal.     Lab Results  Component Value Date   HGBA1C 5.5 12/05/2021    Lab Results  Component Value Date   CREATININE 0.89 08/01/2022   CREATININE 0.81 05/06/2022   CREATININE 0.92 04/10/2022    Lab Results  Component Value Date   WBC 7.6 04/10/2022   HGB 14.3 04/10/2022   HCT 41.9 04/10/2022   PLT 212.0 04/10/2022   GLUCOSE 112 (H) 08/01/2022   CHOL 227 (H) 12/05/2021   TRIG 123.0 12/05/2021   HDL 61.80 12/05/2021   LDLCALC 141 (H) 12/05/2021   ALT 27 08/01/2022   AST 21 08/01/2022   NA 141 08/01/2022   K 4.5 08/01/2022   CL 107 08/01/2022   CREATININE 0.89 08/01/2022   BUN 12 08/01/2022   CO2 27 08/01/2022   TSH 3.24 12/05/2021   INR 1.1 07/07/2021   HGBA1C 5.5 12/05/2021   MICROALBUR 0.8 04/10/2022    No results found.  Assessment & Plan:  .Dysuria -     Urinalysis, Routine w reflex microscopic; Future -     Urine Culture; Future  Mixed hyperlipidemia -     Comprehensive metabolic panel  Vitamin D deficiency -     VITAMIN D 25 Hydroxy (Vit-D Deficiency, Fractures)  Endometrial cancer ([place]) Assessment & Plan: She is undergoing  vaginal cuff brachytherapy by radiation oncology to reduce risk of recurrence and has no adverse effects   Essential hypertension Assessment & Plan: Well controlled on current regimen. Renal function stable, no changes today.    History of pulmonary embolism Assessment & Plan: Dec 7829 embolic event was likely caused by undiagnosed endometrial CA  (subsequently diagnosed in Sept 2023) .  I have advised to continue Eilquis until she has been determined to be considered to be  cured of endometrial CA    Recurrent UTI Assessment & Plan: Her last "UTI" was inconclusive  (culture negative) . Symptoms were likely due to menopausal changes causing dysuria.  Recommend that future encounters for similar symptoms include empiric testing       I provided 30 minutes of face-to-face time during this encounter reviewing patient's last visit with me, patient's  most recent visit with radiation oncology, surgical oncology, previous  labs and imaging studies, counseling on currently addressed issues,  and post visit ordering to diagnostics and therapeutics .  Follow-up: Return in about 6 months (around 01/30/2023).   [name], MD

## 2022-08-02 ENCOUNTER — Encounter (INDEPENDENT_AMBULATORY_CARE_PROVIDER_SITE_OTHER): Payer: Self-pay | Admitting: Vascular Surgery

## 2022-08-02 ENCOUNTER — Ambulatory Visit (INDEPENDENT_AMBULATORY_CARE_PROVIDER_SITE_OTHER): Payer: Medicare Other | Admitting: Vascular Surgery

## 2022-08-02 VITALS — BP 130/72 | HR 64 | Resp 16 | Wt 228.2 lb

## 2022-08-02 DIAGNOSIS — I2609 Other pulmonary embolism with acute cor pulmonale: Secondary | ICD-10-CM

## 2022-08-02 DIAGNOSIS — I10 Essential (primary) hypertension: Secondary | ICD-10-CM | POA: Diagnosis not present

## 2022-08-02 DIAGNOSIS — I2782 Chronic pulmonary embolism: Secondary | ICD-10-CM | POA: Insufficient documentation

## 2022-08-02 DIAGNOSIS — Z86718 Personal history of other venous thrombosis and embolism: Secondary | ICD-10-CM | POA: Diagnosis not present

## 2022-08-02 MED ORDER — APIXABAN 5 MG PO TABS: 5.0000 mg | ORAL_TABLET | Freq: Two times a day (BID) | ORAL | 11 refills | Status: DC

## 2022-08-02 NOTE — Progress Notes (Signed)
MRN : [id number]  Sandra Phillips is a 72 y.o. ([birth year]) female who presents with chief complaint of  Chief Complaint  Patient presents with   Follow-up    72yrfollow up   .  History of Present Illness: Patient returns today in follow up of her DVT and PE.  A little over a year ago, she underwent pulmonary thrombectomy for submassive pulmonary embolus with marked improvement.  She quickly had no significant residual chest pain or shortness of breath.  She has remained on anticoagulation for just over a year now.  She has no leg pain or swelling either.  She has had to stop her Eliquis on a couple of episodes for treatment with a colonoscopy and treatment for endometrial cancer.  She remains on anticoagulation now without any problems from her anticoagulation.  Current Outpatient Medications  Medication Sig Dispense Refill   acetaminophen (TYLENOL) 500 MG tablet Take by mouth.     alendronate (FOSAMAX) 70 MG tablet Take 1 tablet (70 mg total) by mouth every 7 (seven) days. Take with a full glass of water on an empty stomach. 4 tablet 11   ELIQUIS 5 MG TABS tablet TAKE ONE (1) TABLET BY MOUTH TWO TIMES PER DAY 60 tablet 6   metoprolol succinate (TOPROL-XL) 25 MG 24 hr tablet Take 1 tablet (25 mg total) by mouth daily. 90 tablet 3   rosuvastatin (CRESTOR) 10 MG tablet Take 1 tablet (10 mg total) by mouth daily. 90 tablet 1   telmisartan (MICARDIS) 40 MG tablet TAKE ONE TABLET BY MOUTH AT BEDTIME 90 tablet 1   ergocalciferol (DRISDOL) 1.25 MG (50000 UT) capsule Take 1 capsule (50,000 Units total) by mouth once a week. (Patient not taking: Reported on 08/01/2022) 12 capsule 0   fluconazole (DIFLUCAN) 150 MG tablet Take 1 tablet (150 mg total) by mouth daily. Take 2nd tablet 72 hours later (Patient not taking: Reported on 08/01/2022) 2 tablet 0   No current facility-administered medications for this visit.    Past Medical History:  Diagnosis Date   COVID-19    12/13/21   Current long-term  use of anticoagulant medication with history of deep venous thrombosis (DVT)    History of DVT (deep vein thrombosis)    Primary osteoarthritis     Past Surgical History:  Procedure Laterality Date   COLONOSCOPY WITH PROPOFOL N/A 01/11/2022   Procedure: COLONOSCOPY WITH PROPOFOL;  Surgeon: AJonathon Bellows MD;  [facility];  Service: Gastroenterology;  Laterality: N/A;   KNEE ARTHROSCOPY Left 06/12/2021   Procedure: LEFT KNEE ARTHROSCOPY WITH DEBRIDEMENT AND REPAIR OF A POSTERIOR MEDIAL ROOT TEAR AND ABRASION CONDOPLASTY;  Surgeon: PCorky Mull MD;  Location: [facility] ORS;  Service: Orthopedics;  Laterality: Left;   PULMONARY THROMBECTOMY N/A 07/09/2021   Procedure: PULMONARY THROMBECTOMY;  Surgeon: DAlgernon Huxley MD;  [facility];  Service: Cardiovascular;  Laterality: N/A;     Social History   Tobacco Use   Smoking status: Never   Smokeless tobacco: Never  Vaping Use   Vaping Use: Never used  Substance Use Topics   Alcohol use: Yes    Alcohol/week: 1.0 standard drink of alcohol    Types: 1 Glasses of wine per week    Comment: a glass at dinner   Drug use: Never      Family History  Problem Relation Age of Onset   Breast cancer Mother 53      pt also has bone and bladder  Cancer Mother    Rheum arthritis Sister      Allergies  Allergen Reactions   Penicillins     Unknown childhood reaction    Shrimp Extract Allergy Skin Test Swelling    Shrimp Shrimp    Shrimp Flavor Swelling    Shrimp     REVIEW OF SYSTEMS (Negative unless checked)  Constitutional: '[]'$ Weight loss  '[]'$ Fever  '[]'$ Chills Cardiac: '[]'$ Chest pain   '[]'$ Chest pressure   '[]'$ Palpitations   '[]'$ Shortness of breath when laying flat   '[]'$ Shortness of breath at rest   '[]'$ Shortness of breath with exertion. Vascular:  '[]'$ Pain in legs with walking   '[]'$ Pain in legs at rest   '[]'$ Pain in legs when laying flat   '[]'$ Claudication   '[]'$ Pain in feet when walking  '[]'$ Pain in feet at rest  '[]'$ Pain in feet  when laying flat   '[x]'$ History of DVT   '[x]'$ Phlebitis   '[]'$ Swelling in legs   '[]'$ Varicose veins   '[]'$ Non-healing ulcers Pulmonary:   '[]'$ Uses home oxygen   '[]'$ Productive cough   '[]'$ Hemoptysis   '[]'$ Wheeze  '[]'$ COPD   '[]'$ Asthma Neurologic:  '[]'$ Dizziness  '[]'$ Blackouts   '[]'$ Seizures   '[]'$ History of stroke   '[]'$ History of TIA  '[]'$ Aphasia   '[]'$ Temporary blindness   '[]'$ Dysphagia   '[]'$ Weakness or numbness in arms   '[]'$ Weakness or numbness in legs Musculoskeletal:  '[]'$ Arthritis   '[]'$ Joint swelling   '[]'$ Joint pain   '[]'$ Low back pain Hematologic:  '[]'$ Easy bruising  '[]'$ Easy bleeding   '[]'$ Hypercoagulable state   '[]'$ Anemic   Gastrointestinal:  '[]'$ Blood in stool   '[]'$ Vomiting blood  '[]'$ Gastroesophageal reflux/heartburn   '[]'$ Abdominal pain Genitourinary:  '[]'$ Chronic kidney disease   '[]'$ Difficult urination  '[]'$ Frequent urination  '[]'$ Burning with urination   '[]'$ Hematuria Skin:  '[]'$ Rashes   '[]'$ Ulcers   '[]'$ Wounds Psychological:  '[]'$ History of anxiety   '[]'$  History of major depression.  Physical Examination  BP 130/72 (BP Location: Left Arm)   Pulse 64   Resp 16   Wt 228 lb 3.2 oz (103.5 kg)   BMI 35.74 kg/m  Gen:  WD/WN, NAD Head: [place]/AT, No temporalis wasting. Ear/Nose/Throat: Hearing grossly intact, nares w/o erythema or drainage Eyes: Conjunctiva clear. Sclera non-icteric Neck: Supple.  Trachea midline Pulmonary:  Good air movement, no use of accessory muscles.  Cardiac: RRR, no JVD Vascular:  Vessel Right Left  Radial Palpable Palpable                          PT Palpable Palpable  DP Palpable Palpable   Gastrointestinal: soft, non-tender/non-distended. No guarding/reflex.  Musculoskeletal: M/S 5/5 throughout.  No deformity or atrophy.  No significant lower extremity edema. Neurologic: Sensation grossly intact in extremities.  Symmetrical.  Speech is fluent.  Psychiatric: Judgment intact, Mood & affect appropriate for pt's clinical situation. Dermatologic: No rashes or ulcers noted.  No cellulitis or open wounds.      Labs Recent  Results (from the past 2160 hour(s))  Basic metabolic panel     Status: Abnormal   Collection Time: 05/06/22  2:41 PM  Result Value Ref Range   Sodium 139 135 - 145 mEq/L   Potassium 4.3 3.5 - 5.1 mEq/L   Chloride 103 96 - 112 mEq/L   CO2 29 19 - 32 mEq/L   Glucose, Bld 111 (H) 70 - 99 mg/dL   BUN 12 6 - 23 mg/dL   Creatinine, Ser 0.81 0.40 - 1.20 mg/dL   GFR 74.32 >60.00 mL/min    Comment:  Calculated using the CKD-EPI Creatinine Equation (2021)   Calcium 9.3 8.4 - 10.5 mg/dL  VITAMIN D 25 Hydroxy (Vit-D Deficiency, Fractures)     Status: Abnormal   Collection Time: 05/06/22  2:41 PM  Result Value Ref Range   VITD 18.92 (L) 30.00 - 100.00 ng/mL  Rad Onc [facility] Session Summary     Status: None   Collection Time: 06/26/22  2:32 PM  Result Value Ref Range   Course ID [email or web address]    Course Start Date 06/26/2022 10:59 AM    Session Number 1    Course First Treatment Date 06/26/2022  2:24 PM    Course Last Treatment Date 06/26/2022  2:24 PM    Course Elapsed Days 0    Reference Point ID Vagina_HDR    Reference Point Dosage Given to Date 6 Gy   Reference Point Session Dosage Given 6 Gy   Plan ID Vagina_HDR    Plan Fractions Treated to Date 1    Plan Total Fractions Prescribed 5    Plan Prescribed Dose Per Fraction 6 Gy   Plan Total Prescribed Dose 30.000000 Gy   Plan Primary Reference Point Vagina_HDR   Rad [facility] Senter Session Summary     Status: None   Collection Time: 07/01/22 10:27 AM  Result Value Ref Range   Course ID [email or web address]    Course Start Date 06/26/2022 10:59 AM    Session Number 2    Course First Treatment Date 06/26/2022  2:24 PM    Course Last Treatment Date 07/01/2022 10:22 AM    Course Elapsed Days 5    Reference Point ID Vagina_HDR    Reference Point Dosage Given to Date 12 Gy   Reference Point Session Dosage Given 6 Gy   Plan ID Vagina_HDR    Plan Fractions Treated to Date 2    Plan Total Fractions Prescribed 5    Plan Prescribed Dose Per Fraction 6 Gy   Plan Total  Prescribed Dose 30.000000 Gy   Plan Primary Reference Point Vagina_HDR   Rad Sandria Senter Session Summary     Status: None   Collection Time: 07/03/22  2:43 PM  Result Value Ref Range   Course ID [email or web address]    Course Start Date 06/26/2022 10:59 AM    Session Number 3    Course First Treatment Date 06/26/2022  2:24 PM    Course Last Treatment Date 07/03/2022  2:35 PM    Course Elapsed Days 7    Reference Point ID Vagina_HDR    Reference Point Dosage Given to Date 18 Gy   Reference Point Session Dosage Given 6 Gy   Plan ID Vagina_HDR    Plan Fractions Treated to Date 3    Plan Total Fractions Prescribed 5    Plan Prescribed Dose Per Fraction 6 Gy   Plan Total Prescribed Dose 30.000000 Gy   Plan Primary Reference Point Vagina_HDR   Rad Sandria Senter Session Summary     Status: None   Collection Time: 07/10/22  9:45 AM  Result Value Ref Range   Course ID [email or web address]    Course Start Date 06/26/2022 10:59 AM    Session Number 5    Course First Treatment Date 06/26/2022  2:24 PM    Course Last Treatment Date 07/10/2022  9:34 AM    Course Elapsed Days 14    Reference Point ID Vagina_HDR    Reference Point Dosage Given to Date 30 Gy   Reference Point Session Dosage Given 12 Gy  Plan ID Vagina_HDR    Plan Fractions Treated to Date 5    Plan Total Fractions Prescribed 5    Plan Prescribed Dose Per Fraction 6 Gy   Plan Total Prescribed Dose 30.000000 Gy   Plan Primary Reference Point Vagina_HDR   POCT urinalysis dipstick     Status: Abnormal   Collection Time: 07/12/22  9:55 AM  Result Value Ref Range   Color, UA yellow yellow   Clarity, UA cloudy (A) clear   Glucose, UA negative negative mg/dL   Bilirubin, UA negative negative   Ketones, POC UA negative negative mg/dL   Spec Grav, UA 1.015 1.010 - 1.025   Blood, UA moderate (A) negative   pH, UA 5.0 5.0 - 8.0   Protein Ur, POC =30 (A) negative mg/dL   Urobilinogen, UA 0.2 0.2 or 1.0 E.U./dL   Nitrite, UA Negative Negative   Leukocytes, UA  Small (1+) (A) Negative  Urine Culture     Status: Abnormal   Collection Time: 07/12/22  9:55 AM   Specimen: Urine, Clean Catch  Result Value Ref Range   Specimen Description URINE, CLEAN CATCH    Special Requests      NONE Performed at [facility], [address], [place], [place] [address]    Culture (A)     40,000 COLONIES/mL MULTIPLE SPECIES PRESENT, SUGGEST RECOLLECTION   Report Status 07/13/2022 FINAL   VITAMIN D 25 Hydroxy (Vit-D Deficiency, Fractures)     Status: Abnormal   Collection Time: 08/01/22  8:50 AM  Result Value Ref Range   VITD 29.79 (L) 30.00 - 100.00 ng/mL  Comprehensive metabolic panel     Status: Abnormal   Collection Time: 08/01/22  8:50 AM  Result Value Ref Range   Sodium 141 135 - 145 mEq/L   Potassium 4.5 3.5 - 5.1 mEq/L   Chloride 107 96 - 112 mEq/L   CO2 27 19 - 32 mEq/L   Glucose, Bld 112 (H) 70 - 99 mg/dL   BUN 12 6 - 23 mg/dL   Creatinine, Ser 0.89 0.40 - 1.20 mg/dL   Total Bilirubin 0.5 0.2 - 1.2 mg/dL   Alkaline Phosphatase 81 39 - 117 U/L   AST 21 0 - 37 U/L   ALT 27 0 - 35 U/L   Total Protein 6.9 6.0 - 8.3 g/dL   Albumin 4.1 3.5 - 5.2 g/dL   GFR 66.27 >60.00 mL/min    Comment: Calculated using the CKD-EPI Creatinine Equation (2021)   Calcium 9.2 8.4 - 10.5 mg/dL    Radiology No results found.  Assessment/Plan  Essential hypertension blood pressure control important in reducing the progression of atherosclerotic disease. On appropriate oral medications.   Chronic pulmonary embolism ([facility]) Patient had a submassive pulmonary embolus about a year ago.  We discussed options for cessation of anticoagulation versus ongoing anticoagulation.  In a patient who had such a large clot burden who has also been treated for a malignancy, continued anticoagulation would be prudent to reduce the risk of recurrent thrombosis.  Without anticoagulation, her risk of recurrent DVT and/or pulmonary embolus would be significant.  I will give her  another prescription for Eliquis at this time and we will see her back as needed.    Leotis Pain, MD  08/02/2022 2:48 PM    This note was created with Dragon medical transcription system.  Any errors from dictation are purely unintentional

## 2022-08-02 NOTE — Assessment & Plan Note (Signed)
blood pressure control important in reducing the progression of atherosclerotic disease. On appropriate oral medications.  

## 2022-08-02 NOTE — Assessment & Plan Note (Signed)
Patient had a submassive pulmonary embolus about a year ago.  We discussed options for cessation of anticoagulation versus ongoing anticoagulation.  In a patient who had such a large clot burden who has also been treated for a malignancy, continued anticoagulation would be prudent to reduce the risk of recurrent thrombosis.  Without anticoagulation, her risk of recurrent DVT and/or pulmonary embolus would be significant.  I will give her another prescription for Eliquis at this time and we will see her back as needed.

## 2022-08-02 NOTE — Assessment & Plan Note (Signed)
To remain on anticoagulation with her history of DVT and PE and malignancy.

## 2022-08-03 DIAGNOSIS — N390 Urinary tract infection, site not specified: Secondary | ICD-10-CM | POA: Insufficient documentation

## 2022-08-03 NOTE — Assessment & Plan Note (Addendum)
She is undergoing  vaginal cuff brachytherapy by radiation oncology to reduce risk of recurrence and has no adverse effects

## 2022-08-03 NOTE — Assessment & Plan Note (Signed)
Well controlled on current regimen. Renal function stable, no changes today. 

## 2022-08-03 NOTE — Assessment & Plan Note (Addendum)
Her last "UTI" was inconclusive (culture negative) . Symptoms were likely due to menopausal changes causing dysuria.  Recommend that future encounters for similar symptoms include empiric testing

## 2022-08-03 NOTE — Assessment & Plan Note (Addendum)
Dec 4818 embolic event was likely caused by undiagnosed endometrial CA  (subsequently diagnosed in Sept 2023) .  I have advised to continue Eilquis until she has been determined to be considered to be  cured of endometrial CA

## 2022-08-08 NOTE — Progress Notes (Incomplete)
Sandra Phillips is here today for follow up post radiation to the pelvic.  They completed their radiation on: ***   Does the patient complain of any of the following:  Pain:*** Abdominal bloating: *** Diarrhea/Constipation: *** Nausea/Vomiting: *** Vaginal Discharge: *** Blood in Urine or Stool: *** Urinary Issues (dysuria/incomplete emptying/ incontinence/ increased frequency/urgency): *** Does patient report using vaginal dilator 2-3 times a week and/or sexually active 2-3 weeks: *** Post radiation skin changes: ***   Additional comments if applicable:

## 2022-08-13 ENCOUNTER — Encounter: Payer: Self-pay | Admitting: Radiation Oncology

## 2022-08-14 NOTE — Progress Notes (Signed)
  Radiation Oncology         [phone] ________________________________  Name: Sandra Phillips MRN: [id number]  Date: 08/15/2022  DOB: 07-11-1951  End of Treatment Note  Diagnosis:  The encounter diagnosis was Endometrial cancer ([place]).   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  Iridium 192 as the high-dose-rate source.  Narrative: She tolerated vaginal brachytherapy well without any significant side effects.  Plan: The patient has completed radiation treatment. The patient will return to radiation oncology clinic for routine followup in one month. I advised them to call or return sooner if they have any questions or concerns related to their recovery or treatment.  -----------------------------------  Blair Promise, PhD, MD  This document serves as a record of services personally performed by Gery Pray, MD. It was created on his behalf by Roney Mans, a trained medical scribe. The creation of this record is based on the scribe's personal observations and the provider's statements to them. This document has been checked and approved by the attending provider.

## 2022-08-14 NOTE — Progress Notes (Signed)
Radiation Oncology         [phone] ________________________________  Name: ELKA SATTERFIELD MRN: [id number]  Date: 08/15/2022  DOB: 1950/07/24  Follow-Up Visit Note  CC: [name], MD  Hart Rochester, MD  No diagnosis found.  Diagnosis:  The encounter diagnosis was Endometrial cancer ([place]).   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 The patient tolerated vaginal brachytherapy relatively well without any significant side effects.   Since completing brachytherapy, the patient presented to an urgent care on 07/12/22 with the cc's of urinary frequency and urgency starting the prior night. Labs collected showed evidence of UTI, however cultures and urinalysis were inconclusive. She later presented to her PCP on 08/01/22 with concerns for recurrent UTI. Given that her last urine tests were inconclusive, her symptoms were noted as likely due to menopausal changes causing dysuria.   Of note: the patient has a history of recurrent UTI's.  ***  Allergies:  is allergic to penicillins, shrimp extract allergy skin test, and shrimp flavor.  Meds: Current Outpatient Medications  Medication Sig Dispense Refill   acetaminophen (TYLENOL) 500 MG tablet Take by mouth.     alendronate (FOSAMAX) 70 MG tablet Take 1 tablet (70 mg total) by mouth every 7 (seven) days. Take with a full glass of water on an empty stomach. 4 tablet 11   apixaban (ELIQUIS) 5 MG TABS tablet Take 1 tablet (5 mg total) by mouth 2 (two) times  daily. 60 tablet 11   ELIQUIS 5 MG TABS tablet TAKE ONE (1) TABLET BY MOUTH TWO TIMES PER DAY 60 tablet 6   ergocalciferol (DRISDOL) 1.25 MG (50000 UT) capsule Take 1 capsule (50,000 Units total) by mouth once a week. (Patient not taking: Reported on 08/01/2022) 12 capsule 0   fluconazole (DIFLUCAN) 150 MG tablet Take 1 tablet (150 mg total) by mouth daily. Take 2nd tablet 72 hours later (Patient not taking: Reported on 08/01/2022) 2 tablet 0   metoprolol succinate (TOPROL-XL) 25 MG 24 hr tablet Take 1 tablet (25 mg total) by mouth daily. 90 tablet 3   rosuvastatin (CRESTOR) 10 MG tablet Take 1 tablet (10 mg total) by mouth daily. 90 tablet 1   telmisartan (MICARDIS) 40 MG tablet TAKE ONE TABLET BY MOUTH AT BEDTIME 90 tablet 1   No current facility-administered medications for this encounter.    Physical Findings: The patient is in no acute distress. Patient is alert and oriented.  vitals were not taken for this visit. .  No significant changes. Lungs are clear to auscultation bilaterally. Heart has regular rate and rhythm. No palpable cervical, supraclavicular, or axillary adenopathy. Abdomen soft, non-tender, normal bowel sounds.   Lab Findings: Lab Results  Component Value Date   WBC 7.6 04/10/2022   HGB 14.3 04/10/2022   HCT 41.9 04/10/2022   MCV 91.9 04/10/2022   PLT 212.0 04/10/2022    Radiographic Findings: No results found.  Impression:   The encounter diagnosis was Endometrial cancer ([place]).   Stage IB FIGO grade 1 endometrioid adenocarcinoma of the  uterus with >60% myometrial invasion (negative for LVI); s/p hysterectomy, BSO, and lymphadenectomies (negative nodes)    The patient is recovering from the effects of radiation.  ***  Plan:  ***   *** minutes of total time was spent for this patient encounter, including preparation, face-to-face counseling with the patient and coordination of care, physical exam, and documentation of the  encounter. ____________________________________  Blair Promise, PhD, MD  This document serves as a record of services personally performed by Gery Pray, MD. It was created on his behalf by Roney Mans, a trained medical scribe. The creation of this record is based on the scribe's personal observations and the provider's statements to them. This document has been checked and approved by the attending provider.

## 2022-08-15 ENCOUNTER — Ambulatory Visit
Admission: RE | Admit: 2022-08-15 | Discharge: 2022-08-15 | Disposition: A | Discharge status: Home / Self Care | Payer: Medicare Other | Source: Ambulatory Visit | Attending: Radiation Oncology | Admitting: Radiation Oncology

## 2022-08-15 VITALS — BP 129/68 | HR 65 | Temp 97.8°F | Resp 20 | Ht 67.0 in | Wt 229.2 lb

## 2022-08-15 DIAGNOSIS — C541 Malignant neoplasm of endometrium: Secondary | ICD-10-CM | POA: Diagnosis not present

## 2022-08-15 DIAGNOSIS — Z7901 Long term (current) use of anticoagulants: Secondary | ICD-10-CM | POA: Insufficient documentation

## 2022-08-15 DIAGNOSIS — Z79899 Other long term (current) drug therapy: Secondary | ICD-10-CM | POA: Insufficient documentation

## 2022-08-15 DIAGNOSIS — R350 Frequency of micturition: Secondary | ICD-10-CM | POA: Insufficient documentation

## 2022-08-15 DIAGNOSIS — Z923 Personal history of irradiation: Secondary | ICD-10-CM | POA: Insufficient documentation

## 2022-08-15 HISTORY — DX: Personal history of irradiation: Z92.3

## 2022-08-15 NOTE — Progress Notes (Signed)
Sandra Phillips is here today for follow up post radiation to the pelvic.  They completed their radiation on: 07/10/2022   Does the patient complain of any of the following:  Pain: Denies Abdominal bloating: Denies Diarrhea/Constipation: Denies Nausea/Vomiting: Denies Wt Readings from Last 3 Encounters:  08/15/22 229 lb 3.2 oz (104 kg)  08/02/22 228 lb 3.2 oz (103.5 kg)  08/01/22 228 lb 12.8 oz (103.8 kg)   Vaginal Discharge: Denies Blood in Urine or Stool: Denies Urinary Issues (dysuria/incomplete emptying/ incontinence/ increased frequency/urgency): Denies any changes--continued ongoing frequency  Does patient report using vaginal dilator 2-3 times a week and/or sexually active 2-3 weeks: N/A--materials and instructions provided and reviewed today with patient. All questions/concerns addressed Post radiation skin changes: Denies   Additional comments if applicable: Reports she's feeling well and pleased with her healing/continued progress

## 2022-09-01 ENCOUNTER — Encounter: Payer: Self-pay | Admitting: Internal Medicine

## 2022-09-02 NOTE — Telephone Encounter (Signed)
Spoke with pt and scheduled her for see Tomasita Morrow, NP tomorrow at 3 pm.

## 2022-09-03 ENCOUNTER — Encounter: Payer: Self-pay | Admitting: Nurse Practitioner

## 2022-09-03 ENCOUNTER — Ambulatory Visit (INDEPENDENT_AMBULATORY_CARE_PROVIDER_SITE_OTHER): Payer: Medicare Other | Admitting: Nurse Practitioner

## 2022-09-03 VITALS — BP 124/70 | HR 61 | Temp 98.1°F | Ht 67.0 in | Wt 229.8 lb

## 2022-09-03 DIAGNOSIS — L259 Unspecified contact dermatitis, unspecified cause: Secondary | ICD-10-CM | POA: Diagnosis not present

## 2022-09-03 MED ORDER — CETIRIZINE HCL 10 MG PO TABS: 10.0000 mg | ORAL_TABLET | Freq: Every day | ORAL | 0 refills | Status: DC

## 2022-09-03 MED ORDER — PREDNISONE 10 MG PO TABS: ORAL_TABLET | ORAL | 0 refills | Status: DC

## 2022-09-03 NOTE — Assessment & Plan Note (Signed)
Symptoms and presentation consistent with contact dermatitis. Will treat with Prednisone taper and Zyrtec daily. Advised patient to continue fragrance free soaps and lotions. Will refer to Derm if not improving or worsening.

## 2022-09-03 NOTE — Progress Notes (Signed)
Tomasita Morrow, NP-C Phone: [phone]  COSSETTE GATSON is a 72 y.o. female who presents today for rash.  Rash: Patient complains of rash involving the chest, left arm and bilateral legs. Rash started 1 month ago. Appearance: small red, slightly raised bumps. Approximately 10-20 total bumps. The most occurring on chest and left side of neck in a patch like area. Rash has not changed over time. Discomfort associated with rash: is pruritic. Worse at night. Associated symptoms: none. Denies: decrease in energy level, fever, irritability, and myalgia. Patient has not had previous evaluation of rash. Patient has not had previous treatment. Patient has not had contacts with similar rash. Patient has not identified precipitant. Patient has not had new exposures (soaps, lotions, laundry detergents, foods, medications, plants, insects or animals.) She reports recently switching to fragrance free soaps and lotions which has provided some relief.    Social History   Tobacco Use  Smoking Status Never  Smokeless Tobacco Never    Current Outpatient Medications on File Prior to Visit  Medication Sig Dispense Refill   acetaminophen (TYLENOL) 500 MG tablet Take by mouth.     alendronate (FOSAMAX) 70 MG tablet Take 1 tablet (70 mg total) by mouth every 7 (seven) days. Take with a full glass of water on an empty stomach. 4 tablet 11   apixaban (ELIQUIS) 5 MG TABS tablet Take 1 tablet (5 mg total) by mouth 2 (two) times daily. 60 tablet 11   ELIQUIS 5 MG TABS tablet TAKE ONE (1) TABLET BY MOUTH TWO TIMES PER DAY 60 tablet 6   metoprolol succinate (TOPROL-XL) 25 MG 24 hr tablet Take 1 tablet (25 mg total) by mouth daily. 90 tablet 3   rosuvastatin (CRESTOR) 10 MG tablet Take 1 tablet (10 mg total) by mouth daily. 90 tablet 1   telmisartan (MICARDIS) 40 MG tablet TAKE ONE TABLET BY MOUTH AT BEDTIME 90 tablet 1   ergocalciferol (DRISDOL) 1.25 MG (50000 UT) capsule Take 1 capsule (50,000 Units total) by mouth once a  week. (Patient not taking: Reported on 08/01/2022) 12 capsule 0   fluconazole (DIFLUCAN) 150 MG tablet Take 1 tablet (150 mg total) by mouth daily. Take 2nd tablet 72 hours later (Patient not taking: Reported on 08/01/2022) 2 tablet 0   No current facility-administered medications on file prior to visit.     ROS see history of present illness  Objective  Physical Exam Vitals:   09/03/22 1455  BP: 124/70  Pulse: 61  Temp: 98.1 F (36.7 C)  SpO2: 99%    BP Readings from Last 3 Encounters:  09/03/22 124/70  08/15/22 129/68  08/02/22 130/72   Wt Readings from Last 3 Encounters:  09/03/22 229 lb 12.8 oz (104.2 kg)  08/15/22 229 lb 3.2 oz (104 kg)  08/02/22 228 lb 3.2 oz (103.5 kg)    Physical Exam Constitutional:      General: She is not in acute distress.    Appearance: Normal appearance.  HENT:     Head: Normocephalic.  Cardiovascular:     Rate and Rhythm: Normal rate and regular rhythm.     Heart sounds: Normal heart sounds.  Pulmonary:     Effort: Pulmonary effort is normal.     Breath sounds: Normal breath sounds.  Skin:    General: Skin is warm and dry.     Findings: Rash present. Rash is papular and urticarial.     Comments: Noted across chest, small patch on left side of neck and left arm,  bilateral lower legs.  Neurological:     General: No focal deficit present.     Mental Status: She is alert.  Psychiatric:        Mood and Affect: Mood normal.        Behavior: Behavior normal.    Assessment/Plan: Please see individual problem list.  Contact dermatitis, unspecified contact dermatitis type, unspecified trigger Assessment & Plan: Symptoms and presentation consistent with contact dermatitis. Will treat with Prednisone taper and Zyrtec daily. Advised patient to continue fragrance free soaps and lotions. Will refer to Derm if not improving or worsening.   Orders: -     predniSONE; TAKE 3 TABLETS PO QD FOR 3 DAYS THEN TAKE 2 TABLETS PO QD FOR 3 DAYS THEN  TAKE 1 TABLET PO QD FOR 3 DAYS THEN TAKE 1/2 TAB PO QD FOR 3 DAYS  Dispense: 20 tablet; Refill: 0 -     Cetirizine HCl; Take 1 tablet (10 mg total) by mouth daily.  Dispense: 30 tablet; Refill: 0   Return if symptoms worsen or fail to improve.   Tomasita Morrow, NP-C [place]

## 2022-09-03 NOTE — Patient Instructions (Addendum)
It was nice to meet you!  I have sent in a Prednisone taper to your pharmacy and Zyrtec for you to take once daily in the mornings.  Please let me know if your rash is not improving or worsening.

## 2022-10-31 ENCOUNTER — Other Ambulatory Visit: Payer: Self-pay

## 2022-10-31 MED ORDER — ROSUVASTATIN CALCIUM 10 MG PO TABS: 10.0000 mg | ORAL_TABLET | Freq: Every day | ORAL | 1 refills | Status: DC

## 2022-11-21 DIAGNOSIS — Z880 Allergy status to penicillin: Secondary | ICD-10-CM | POA: Diagnosis not present

## 2022-11-21 DIAGNOSIS — Z86711 Personal history of pulmonary embolism: Secondary | ICD-10-CM | POA: Diagnosis not present

## 2022-11-21 DIAGNOSIS — Z7901 Long term (current) use of anticoagulants: Secondary | ICD-10-CM | POA: Diagnosis not present

## 2022-11-21 DIAGNOSIS — C541 Malignant neoplasm of endometrium: Secondary | ICD-10-CM | POA: Diagnosis not present

## 2022-11-21 DIAGNOSIS — Z91013 Allergy to seafood: Secondary | ICD-10-CM | POA: Diagnosis not present

## 2022-11-26 ENCOUNTER — Encounter: Payer: Self-pay | Admitting: Internal Medicine

## 2022-11-26 DIAGNOSIS — K080 Exfoliation of teeth due to systemic causes: Secondary | ICD-10-CM | POA: Diagnosis not present

## 2022-12-03 ENCOUNTER — Other Ambulatory Visit: Payer: Self-pay | Admitting: Pharmacist

## 2022-12-03 NOTE — Progress Notes (Signed)
Pharmacy Quality Measure Review  This patient is appearing on the insurance-provided list for being at risk of failing the adherence measure for cholesterol medications this calendar year.   Medication: rosuvastatin 10 mg Last fill date: 10/31/22 for 90 day supply - report from insurance is outdated and did not have this fill noted  No action needed at this time.   Catie Eppie Gibson, PharmD, BCACP, CPP [facility] [phone]

## 2022-12-17 ENCOUNTER — Other Ambulatory Visit: Payer: Self-pay | Admitting: Internal Medicine

## 2022-12-23 DIAGNOSIS — K080 Exfoliation of teeth due to systemic causes: Secondary | ICD-10-CM | POA: Diagnosis not present

## 2023-01-28 ENCOUNTER — Ambulatory Visit (INDEPENDENT_AMBULATORY_CARE_PROVIDER_SITE_OTHER): Payer: Medicare Other | Admitting: *Deleted

## 2023-01-28 VITALS — Ht 67.0 in | Wt 228.0 lb

## 2023-01-28 DIAGNOSIS — Z0000 Encounter for general adult medical examination without abnormal findings: Secondary | ICD-10-CM | POA: Diagnosis not present

## 2023-01-28 NOTE — Patient Instructions (Signed)
Sandra Phillips , Thank you for taking time to come for your Medicare Wellness Visit. I appreciate your ongoing commitment to your health goals. Please review the following plan we discussed and let me know if I can assist you in the future.   These are the goals we discussed:  Goals      Increase physical activity     As tolerated     Patient Stated     Wants to lose some weight        This is a list of the screening recommended for you and due dates:  Health Maintenance  Topic Date Due   Zoster (Shingles) Vaccine (1 of 2) Never done   COVID-19 Vaccine (5 - 2023-24 season) 03/22/2022   Flu Shot  02/20/2023   Mammogram  04/04/2023   Yearly kidney health urinalysis for diabetes  04/11/2023   Yearly kidney function blood test for diabetes  08/02/2023   Medicare Annual Wellness Visit  01/28/2024   Colon Cancer Screening  01/12/2032   DTaP/Tdap/Td vaccine (2 - Td or Tdap) 01/30/2032   Pneumonia Vaccine  Completed   DEXA scan (bone density measurement)  Completed   Hepatitis C Screening  Completed   HPV Vaccine  Aged Out    Advanced directives: Will bring to next visit  Conditions/risks identified: none  Next appointment: Follow up in one year for your annual wellness visit 02/02/24 @ 8:15   Preventive Care 65 Years and Older, Female Preventive care refers to lifestyle choices and visits with your health care provider that can promote health and wellness. What does preventive care include? A yearly physical exam. This is also called an annual well check. Dental exams once or twice a year. Routine eye exams. Ask your health care provider how often you should have your eyes checked. Personal lifestyle choices, including: Daily care of your teeth and gums. Regular physical activity. Eating a healthy diet. Avoiding tobacco and drug use. Limiting alcohol use. Practicing safe sex. Taking low-dose aspirin every day. Taking vitamin and mineral supplements as recommended by your  health care provider. What happens during an annual well check? The services and screenings done by your health care provider during your annual well check will depend on your age, overall health, lifestyle risk factors, and family history of disease. Counseling  Your health care provider may ask you questions about your: Alcohol use. Tobacco use. Drug use. Emotional well-being. Home and relationship well-being. Sexual activity. Eating habits. History of falls. Memory and ability to understand (cognition). Work and work Astronomer. Reproductive health. Screening  You may have the following tests or measurements: Height, weight, and BMI. Blood pressure. Lipid and cholesterol levels. These may be checked every 5 years, or more frequently if you are over 69 years old. Skin check. Lung cancer screening. You may have this screening every year starting at age 43 if you have a 30-pack-year history of smoking and currently smoke or have quit within the past 15 years. Fecal occult blood test (FOBT) of the stool. You may have this test every year starting at age 43. Flexible sigmoidoscopy or colonoscopy. You may have a sigmoidoscopy every 5 years or a colonoscopy every 10 years starting at age 19. Hepatitis C blood test. Hepatitis B blood test. Sexually transmitted disease (STD) testing. Diabetes screening. This is done by checking your blood sugar (glucose) after you have not eaten for a while (fasting). You may have this done every 1-3 years. Bone density scan. This is done to  screen for osteoporosis. You may have this done starting at age 42. Mammogram. This may be done every 1-2 years. Talk to your health care provider about how often you should have regular mammograms. Talk with your health care provider about your test results, treatment options, and if necessary, the need for more tests. Vaccines  Your health care provider may recommend certain vaccines, such as: Influenza vaccine.  This is recommended every year. Tetanus, diphtheria, and acellular pertussis (Tdap, Td) vaccine. You may need a Td booster every 10 years. Zoster vaccine. You may need this after age 26. Pneumococcal 13-valent conjugate (PCV13) vaccine. One dose is recommended after age 58. Pneumococcal polysaccharide (PPSV23) vaccine. One dose is recommended after age 68. Talk to your health care provider about which screenings and vaccines you need and how often you need them. This information is not intended to replace advice given to you by your health care provider. Make sure you discuss any questions you have with your health care provider. Document Released: 08/04/2015 Document Revised: 03/27/2016 Document Reviewed: 05/09/2015 Elsevier Interactive Patient Education  2017 [employer].  Fall Prevention in the Home Falls can cause injuries. They can happen to people of all ages. There are many things you can do to make your home safe and to help prevent falls. What can I do on the outside of my home? Regularly fix the edges of walkways and driveways and fix any cracks. Remove anything that might make you trip as you walk through a door, such as a raised step or threshold. Trim any bushes or trees on the path to your home. Use bright outdoor lighting. Clear any walking paths of anything that might make someone trip, such as rocks or tools. Regularly check to see if handrails are loose or broken. Make sure that both sides of any steps have handrails. Any raised decks and porches should have guardrails on the edges. Have any leaves, snow, or ice cleared regularly. Use sand or salt on walking paths during winter. Clean up any spills in your garage right away. This includes oil or grease spills. What can I do in the bathroom? Use night lights. Install grab bars by the toilet and in the tub and shower. Do not use towel bars as grab bars. Use non-skid mats or decals in the tub or shower. If you need to sit  down in the shower, use a plastic, non-slip stool. Keep the floor dry. Clean up any water that spills on the floor as soon as it happens. Remove soap buildup in the tub or shower regularly. Attach bath mats securely with double-sided non-slip rug tape. Do not have throw rugs and other things on the floor that can make you trip. What can I do in the bedroom? Use night lights. Make sure that you have a light by your bed that is easy to reach. Do not use any sheets or blankets that are too big for your bed. They should not hang down onto the floor. Have a firm chair that has side arms. You can use this for support while you get dressed. Do not have throw rugs and other things on the floor that can make you trip. What can I do in the kitchen? Clean up any spills right away. Avoid walking on wet floors. Keep items that you use a lot in easy-to-reach places. If you need to reach something above you, use a strong step stool that has a grab bar. Keep electrical cords out of the way.  Do not use floor polish or wax that makes floors slippery. If you must use wax, use non-skid floor wax. Do not have throw rugs and other things on the floor that can make you trip. What can I do with my stairs? Do not leave any items on the stairs. Make sure that there are handrails on both sides of the stairs and use them. Fix handrails that are broken or loose. Make sure that handrails are as long as the stairways. Check any carpeting to make sure that it is firmly attached to the stairs. Fix any carpet that is loose or worn. Avoid having throw rugs at the top or bottom of the stairs. If you do have throw rugs, attach them to the floor with carpet tape. Make sure that you have a light switch at the top of the stairs and the bottom of the stairs. If you do not have them, ask someone to add them for you. What else can I do to help prevent falls? Wear shoes that: Do not have high heels. Have rubber bottoms. Are  comfortable and fit you well. Are closed at the toe. Do not wear sandals. If you use a stepladder: Make sure that it is fully opened. Do not climb a closed stepladder. Make sure that both sides of the stepladder are locked into place. Ask someone to hold it for you, if possible. Clearly mark and make sure that you can see: Any grab bars or handrails. First and last steps. Where the edge of each step is. Use tools that help you move around (mobility aids) if they are needed. These include: Canes. Walkers. Scooters. Crutches. Turn on the lights when you go into a dark area. Replace any light bulbs as soon as they burn out. Set up your furniture so you have a clear path. Avoid moving your furniture around. If any of your floors are uneven, fix them. If there are any pets around you, be aware of where they are. Review your medicines with your doctor. Some medicines can make you feel dizzy. This can increase your chance of falling. Ask your doctor what other things that you can do to help prevent falls. This information is not intended to replace advice given to you by your health care provider. Make sure you discuss any questions you have with your health care provider. Document Released: 05/04/2009 Document Revised: 12/14/2015 Document Reviewed: 08/12/2014 Elsevier Interactive Patient Education  2017 [employer].

## 2023-01-28 NOTE — Progress Notes (Addendum)
Subjective:   Sandra Phillips is a 72 y.o. female who presents for Medicare Annual (Subsequent) preventive examination.  Visit Complete: Virtual  I connected with  Sandra Phillips on 01/28/23 by a audio enabled telemedicine application and verified that I am speaking with the correct person using two identifiers.  Patient Location: Home  Provider Location: Office/Clinic  I discussed the limitations of evaluation and management by telemedicine. The patient expressed understanding and agreed to proceed.  Review of Systems     Cardiac Risk Factors include: advanced age (>39men, >38 women);hypertension;obesity (BMI >30kg/m2);Other (see comment), Risk factor comments: sinus tachycardia     Objective:    Today's Vitals   01/28/23 1440  Weight: 228 lb (103.4 kg)  Height: 5\' 7"  (1.702 m)   Body mass index is 35.71 kg/m.     01/28/2023    2:54 PM 08/15/2022    9:31 AM 06/26/2022    8:59 AM 06/17/2022    7:44 AM 01/16/2022    2:44 PM 01/11/2022   11:54 AM 08/14/2021    1:37 PM  Advanced Directives  Does Patient Have a Medical Advance Directive? Yes No Yes Yes Yes Yes No  Type of Estate agent of [place];Living will   Healthcare Power of [place];Living will Healthcare Power of [place];Living will    Does patient want to make changes to medical advance directive?   No - Patient declined No - Patient declined No - Patient declined    Copy of Healthcare Power of Attorney in Chart? No - copy requested   No - copy requested No - copy requested    Would patient like information on creating a medical advance directive?  No - Patient declined  No - Patient declined   No - Patient declined    Current Medications (verified) Outpatient Encounter Medications as of 01/28/2023  Medication Sig   acetaminophen (TYLENOL) 500 MG tablet Take by mouth.   apixaban (ELIQUIS) 5 MG TABS tablet Take 1 tablet (5 mg total) by mouth 2 (two) times daily.   cetirizine (ZYRTEC) 10 MG tablet Take  1 tablet (10 mg total) by mouth daily.   cholecalciferol (VITAMIN D3) 25 MCG (1000 UNIT) tablet Take 1,000 Units by mouth daily. Take 5 times a week   ELIQUIS 5 MG TABS tablet TAKE ONE (1) TABLET BY MOUTH TWO TIMES PER DAY   metoprolol succinate (TOPROL-XL) 25 MG 24 hr tablet TAKE 1 TABLET BY MOUTH DAILY   rosuvastatin (CRESTOR) 10 MG tablet Take 1 tablet (10 mg total) by mouth daily.   telmisartan (MICARDIS) 40 MG tablet TAKE ONE TABLET BY MOUTH AT BEDTIME   alendronate (FOSAMAX) 70 MG tablet Take 1 tablet (70 mg total) by mouth every 7 (seven) days. Take with a full glass of water on an empty stomach. (Patient not taking: Reported on 01/28/2023)   ergocalciferol (DRISDOL) 1.25 MG (50000 UT) capsule Take 1 capsule (50,000 Units total) by mouth once a week. (Patient not taking: Reported on 01/28/2023)   fluconazole (DIFLUCAN) 150 MG tablet Take 1 tablet (150 mg total) by mouth daily. Take 2nd tablet 72 hours later (Patient not taking: Reported on 08/01/2022)   predniSONE (DELTASONE) 10 MG tablet TAKE 3 TABLETS PO QD FOR 3 DAYS THEN TAKE 2 TABLETS PO QD FOR 3 DAYS THEN TAKE 1 TABLET PO QD FOR 3 DAYS THEN TAKE 1/2 TAB PO QD FOR 3 DAYS   No facility-administered encounter medications on file as of 01/28/2023.    Allergies (verified)  Penicillins, Shrimp extract, and Shrimp flavor   History: Past Medical History:  Diagnosis Date   COVID-19    12/13/21   Current long-term use of anticoagulant medication with history of deep venous thrombosis (DVT)    History of DVT (deep vein thrombosis)    History of radiation therapy    Vagina (HDR) 06/26/22-07/10/22- Dr. Antony Blackbird   Primary osteoarthritis    Past Surgical History:  Procedure Laterality Date   COLONOSCOPY WITH PROPOFOL N/A 01/11/2022   Procedure: COLONOSCOPY WITH PROPOFOL;  Surgeon: Wyline Mood, MD;  Location: [facility] ENDOSCOPY;  Service: Gastroenterology;  Laterality: N/A;   KNEE ARTHROSCOPY Left 06/12/2021   Procedure: LEFT KNEE ARTHROSCOPY  WITH DEBRIDEMENT AND REPAIR OF A POSTERIOR MEDIAL ROOT TEAR AND ABRASION CONDOPLASTY;  Surgeon: Christena Flake, MD;  Location: [facility] ORS;  Service: Orthopedics;  Laterality: Left;   PULMONARY THROMBECTOMY N/A 07/09/2021   Procedure: PULMONARY THROMBECTOMY;  Surgeon: Annice Needy, MD;  Location: [facility] INVASIVE CV LAB;  Service: Cardiovascular;  Laterality: N/A;   ROBOTIC ASSISTED TOTAL HYSTERECTOMY  03/2022   Family History  Problem Relation Age of Onset   Breast cancer Mother 77       pt also has bone and bladder   Cancer Mother    Rheum arthritis Sister    Social History   Socioeconomic History   Marital status: Married    Spouse name: Not on file   Number of children: Not on file   Years of education: Not on file   Highest education level: Not on file  Occupational History   Not on file  Tobacco Use   Smoking status: Never   Smokeless tobacco: Never  Vaping Use   Vaping Use: Never used  Substance and Sexual Activity   Alcohol use: Yes    Alcohol/week: 1.0 standard drink of alcohol    Types: 1 Glasses of wine per week    Comment: a glass at dinner   Drug use: Never   Sexual activity: Not Currently  Other Topics Concern   Not on file  Social History Narrative   Married   Social Determinants of Health   Financial Resource Strain: Low Risk  (01/28/2023)   Overall Financial Resource Strain (CARDIA)    Difficulty of Paying Living Expenses: Not hard at all  Food Insecurity: No Food Insecurity (01/28/2023)   Hunger Vital Sign    Worried About Running Out of Food in the Last Year: Never true    Ran Out of Food in the Last Year: Never true  Transportation Needs: No Transportation Needs (01/28/2023)   PRAPARE - Administrator, Civil Service (Medical): No    Lack of Transportation (Non-Medical): No  Physical Activity: Insufficiently Active (01/28/2023)   Exercise Vital Sign    Days of Exercise per Week: 3 days    Minutes of Exercise per Session: 30 min  Stress: No Stress  Concern Present (01/28/2023)   [employer] of Occupational Health - Occupational Stress Questionnaire    Feeling of Stress : Not at all  Social Connections: Socially Integrated (01/28/2023)   Social Connection and Isolation Panel [NHANES]    Frequency of Communication with Friends and Family: More than three times a week    Frequency of Social Gatherings with Friends and Family: More than three times a week    Attends Religious Services: More than 4 times per year    Active Member of [employer] or Organizations: Yes    Attends Banker Meetings:  More than 4 times per year    Marital Status: Married    Tobacco Counseling Counseling given: Not Answered   Clinical Intake:  Pre-visit preparation completed: Yes  Pain : No/denies pain     BMI - recorded: 35.71 Nutritional Status: BMI > 30  Obese Nutritional Risks: None Diabetes: No  How often do you need to have someone help you when you read instructions, pamphlets, or other written materials from your doctor or pharmacy?: 1 - Never  Interpreter Needed?: No  Information entered by :: R. Deaglan Lile LPN   Activities of Daily Living    01/28/2023    2:43 PM 01/24/2023    1:24 PM  In your present state of health, do you have any difficulty performing the following activities:  Hearing? 0 0  Vision? 0 0  Comment glasses   Difficulty concentrating or making decisions? 0 0  Walking or climbing stairs? 1 0  Comment arthritis in knees   Dressing or bathing? 0 0  Doing errands, shopping? 0 0  Preparing Food and eating ? N N  Using the Toilet? N N  In the past six months, have you accidently leaked urine? N N  Do you have problems with loss of bowel control? N N  Managing your Medications? N N  Managing your Finances? N N  Housekeeping or managing your Housekeeping? N N    Patient Care Team: Sherlene Shams, MD as PCP - General (Internal Medicine)  Indicate any recent Medical Services you may have received from other  than [facility] providers in the past year (date may be approximate).     Assessment:   This is a routine wellness examination for [place].  Hearing/Vision screen Hearing Screening - Comments:: No issues Vision Screening - Comments:: Wears glasses  Dietary issues and exercise activities discussed:     Goals Addressed             This Visit's Progress    Patient Stated       Wants to lose some weight       Depression Screen    01/28/2023    2:49 PM 09/03/2022    2:58 PM 08/01/2022    8:07 AM 04/29/2022    8:13 AM 04/10/2022    9:49 AM 01/16/2022    2:42 PM 01/02/2022    2:23 PM  PHQ 2/9 Scores  PHQ - 2 Score 0 0 0 0 0 0 0  PHQ- 9 Score 0          Fall Risk    01/28/2023    2:55 PM 01/24/2023    1:24 PM 09/03/2022    2:58 PM 08/01/2022    8:07 AM 04/29/2022    8:13 AM  Fall Risk   Falls in the past year? 0 0 0 0 0  Number falls in past yr: 0 0 0 0 0  Injury with Fall? 0 0 0 0 0  Risk for fall due to : No Fall Risks  No Fall Risks No Fall Risks No Fall Risks  Follow up Falls prevention discussed;Falls evaluation completed  Falls evaluation completed Falls evaluation completed Falls evaluation completed    MEDICARE RISK AT HOME:  Medicare Risk at Home - 01/28/23 1455     Any stairs in or around the home? No    If so, are there any without handrails? No    Home free of loose throw rugs in walkways, pet beds, electrical cords, etc? Yes    Adequate  lighting in your home to reduce risk of falls? Yes    Life alert? No    Use of a cane, walker or w/c? No    Grab bars in the bathroom? Yes    Shower chair or bench in shower? No    Elevated toilet seat or a handicapped toilet? Yes             Cognitive Function:        01/28/2023    2:56 PM  6CIT Screen  What Year? 0 points  What month? 0 points  What time? 0 points  Count back from 20 0 points  Months in reverse 0 points  Repeat phrase 2 points  Total Score 2 points    Immunizations Immunization History   Administered Date(s) Administered   Fluad Quad(high Dose 65+) 04/10/2022   Moderna Sars-Covid-2 Vaccination 09/02/2019, 10/01/2019, 05/23/2020   PFIZER(Purple Top)SARS-COV-2 Vaccination 01/25/2021   PNEUMOCOCCAL CONJUGATE-20 04/29/2022   Tdap 01/29/2022    TDAP status: Up to date  Flu Vaccine status: Up to date  Pneumococcal vaccine status: Up to date  Covid-19 vaccine status: Completed vaccines  Qualifies for Shingles Vaccine? Yes   Zostavax completed No   Shingrix Completed?: No.    Education has been provided regarding the importance of this vaccine. Patient has been advised to call insurance company to determine out of pocket expense if they have not yet received this vaccine. Advised may also receive vaccine at local pharmacy or Health Dept. Verbalized acceptance and understanding.  Screening Tests Health Maintenance  Topic Date Due   Zoster Vaccines- Shingrix (1 of 2) Never done   COVID-19 Vaccine (5 - 2023-24 season) 03/22/2022   Medicare Annual Wellness (AWV)  01/17/2023   INFLUENZA VACCINE  02/20/2023   MAMMOGRAM  04/04/2023   Diabetic kidney evaluation - Urine ACR  04/11/2023   Diabetic kidney evaluation - eGFR measurement  08/02/2023   Colonoscopy  01/12/2032   DTaP/Tdap/Td (2 - Td or Tdap) 01/30/2032   Pneumonia Vaccine 74+ Years old  Completed   DEXA SCAN  Completed   Hepatitis C Screening  Completed   HPV VACCINES  Aged Out    Health Maintenance  Health Maintenance Due  Topic Date Due   Zoster Vaccines- Shingrix (1 of 2) Never done   COVID-19 Vaccine (5 - 2023-24 season) 03/22/2022   Medicare Annual Wellness (AWV)  01/17/2023    Colorectal cancer screening: Type of screening: Colonoscopy. Completed 6/23. Repeat every 3- 5  years. Patient note sure Mammogram status: Completed 9/23. Repeat every year  Bone Density status: Completed 9/23. Results reflect: Bone density results: OSTEOPOROSIS. Repeat every 2 years.  Lung Cancer Screening: (Low Dose CT  Chest recommended if Age 72-80 years, 20 pack-year currently smoking OR have quit w/in 15years.) does not qualify.     Additional Screening:  Hepatitis C Screening: does not qualify; Completed 5/23  Vision Screening: Recommended annual ophthalmology exams for early detection of glaucoma and other disorders of the eye. Is the patient up to date with their annual eye exam?  Yes  Who is the provider or what is the name of the office in which the patient attends annual eye exams? [facility], [place] If pt is not established with a provider, would they like to be referred to a provider to establish care? No .   Dental Screening: Recommended annual dental exams for proper oral hygiene    Community Resource Referral / Chronic Care Management: CRR required this visit?  No  CCM required this visit?  No     Plan:     I have personally reviewed and noted the following in the patient's chart:   Medical and social history Use of alcohol, tobacco or illicit drugs  Current medications and supplements including opioid prescriptions. Patient is not currently taking opioid prescriptions. Functional ability and status Nutritional status Physical activity Advanced directives List of other physicians Hospitalizations, surgeries, and ER visits in previous 12 months Vitals Screenings to include cognitive, depression, and falls Referrals and appointments  In addition, I have reviewed and discussed with patient certain preventive protocols, quality metrics, and best practice recommendations. A written personalized care plan for preventive services as well as general preventive health recommendations were provided to patient.     Sydell Axon, LPN   02/25/5783   After Visit Summary: (MyChart) Due to this being a telephonic visit, the after visit summary with patients personalized plan was offered to patient via MyChart   Nurse Notes: None    I have reviewed the above information  and agree with above.   Duncan Dull, MD

## 2023-02-03 ENCOUNTER — Encounter: Payer: Self-pay | Admitting: Internal Medicine

## 2023-02-03 ENCOUNTER — Ambulatory Visit (INDEPENDENT_AMBULATORY_CARE_PROVIDER_SITE_OTHER): Payer: Medicare Other | Admitting: Internal Medicine

## 2023-02-03 VITALS — BP 124/76 | HR 69 | Temp 97.7°F | Ht 67.0 in | Wt 230.8 lb

## 2023-02-03 DIAGNOSIS — E669 Obesity, unspecified: Secondary | ICD-10-CM | POA: Diagnosis not present

## 2023-02-03 DIAGNOSIS — Z7901 Long term (current) use of anticoagulants: Secondary | ICD-10-CM | POA: Diagnosis not present

## 2023-02-03 DIAGNOSIS — I2609 Other pulmonary embolism with acute cor pulmonale: Secondary | ICD-10-CM

## 2023-02-03 DIAGNOSIS — E559 Vitamin D deficiency, unspecified: Secondary | ICD-10-CM

## 2023-02-03 DIAGNOSIS — Z0000 Encounter for general adult medical examination without abnormal findings: Secondary | ICD-10-CM

## 2023-02-03 DIAGNOSIS — I700 Atherosclerosis of aorta: Secondary | ICD-10-CM

## 2023-02-03 DIAGNOSIS — M816 Localized osteoporosis [Lequesne]: Secondary | ICD-10-CM

## 2023-02-03 DIAGNOSIS — D126 Benign neoplasm of colon, unspecified: Secondary | ICD-10-CM

## 2023-02-03 DIAGNOSIS — I2782 Chronic pulmonary embolism: Secondary | ICD-10-CM

## 2023-02-03 LAB — COMPREHENSIVE METABOLIC PANEL
ALT: 24 U/L (ref 0–35)
AST: 27 U/L (ref 0–37)
Albumin: 4.1 g/dL (ref 3.5–5.2)
Alkaline Phosphatase: 75 U/L (ref 39–117)
BUN: 7 mg/dL (ref 6–23)
CO2: 25 mEq/L (ref 19–32)
Calcium: 9.5 mg/dL (ref 8.4–10.5)
Chloride: 107 mEq/L (ref 96–112)
Creatinine, Ser: 0.85 mg/dL (ref 0.40–1.20)
GFR: 69.78 mL/min (ref >60.00)
Glucose, Bld: 106 mg/dL — ABNORMAL HIGH (ref 70–99)
Potassium: 4.3 mEq/L (ref 3.5–5.1)
Sodium: 140 mEq/L (ref 135–145)
Total Bilirubin: 0.7 mg/dL (ref 0.2–1.2)
Total Protein: 7.2 g/dL (ref 6.0–8.3)

## 2023-02-03 LAB — CBC WITH DIFFERENTIAL/PLATELET
Basophils Absolute: 0 10*3/uL (ref 0.0–0.1)
Basophils Relative: 0.4 % (ref 0.0–3.0)
Eosinophils Absolute: 0.1 10*3/uL (ref 0.0–0.7)
Eosinophils Relative: 2.4 % (ref 0.0–5.0)
HCT: 42.3 % (ref 36.0–46.0)
Hemoglobin: 14.2 g/dL (ref 12.0–15.0)
Lymphocytes Relative: 20.2 % (ref 12.0–46.0)
Lymphs Abs: 1.1 10*3/uL (ref 0.7–4.0)
MCHC: 33.7 g/dL (ref 30.0–36.0)
MCV: 94.8 fl (ref 78.0–100.0)
Monocytes Absolute: 0.6 10*3/uL (ref 0.1–1.0)
Monocytes Relative: 10 % (ref 3.0–12.0)
Neutro Abs: 3.8 10*3/uL (ref 1.4–7.7)
Neutrophils Relative %: 67 % (ref 43.0–77.0)
Platelets: 171 10*3/uL (ref 150.0–400.0)
RBC: 4.46 Mil/uL (ref 3.87–5.11)
RDW: 13.8 % (ref 11.5–15.5)
WBC: 5.7 10*3/uL (ref 4.0–10.5)

## 2023-02-03 LAB — HEMOGLOBIN A1C: Hgb A1c MFr Bld: 5.6 % (ref 4.6–6.5)

## 2023-02-03 LAB — TSH: TSH: 2.58 u[IU]/mL (ref 0.35–5.50)

## 2023-02-03 LAB — VITAMIN D 25 HYDROXY (VIT D DEFICIENCY, FRACTURES): VITD: 36.16 ng/mL (ref 30.00–100.00)

## 2023-02-03 MED ORDER — PREDNISONE 10 MG PO TABS: ORAL_TABLET | ORAL | 0 refills | Status: DC

## 2023-02-03 MED ORDER — AMOXICILLIN-POT CLAVULANATE 875-125 MG PO TABS: 1.0000 | ORAL_TABLET | Freq: Two times a day (BID) | ORAL | 0 refills | Status: DC

## 2023-02-03 NOTE — Assessment & Plan Note (Signed)

## 2023-02-03 NOTE — Assessment & Plan Note (Signed)
Findings from CT reviewed.  she is tolerating rosuvastatin.

## 2023-02-03 NOTE — Assessment & Plan Note (Addendum)
She had 2 TAs and a sessile polyp removed in  June 2023.  Overdue for follow up . Urgent referral made

## 2023-02-03 NOTE — Assessment & Plan Note (Signed)
Lifelong anticoagulation recommended as long as she remains ambulatory without falls.  Will need suspension prior to oral surgery and other procedures

## 2023-02-03 NOTE — Assessment & Plan Note (Signed)
She is tolerating  weekly alendronate .  Vitamin d supplementation

## 2023-02-03 NOTE — Assessment & Plan Note (Signed)
Currently taking 1000 ius daily  ?

## 2023-02-03 NOTE — Assessment & Plan Note (Signed)
 I have addressed  BMI and recommended wt loss of 10% of body weight over the next 6 months using a low fat, fruit/vegetable based Mediteranean diet and regular exercise a minimum of 5 days per week.   

## 2023-02-03 NOTE — Patient Instructions (Addendum)
1) Exercise.  Just start.  I recommend enlisting the help of a trainer to guide your efforts and make you accountable.  2) (Portion)  size matters!   You might want to try a premixed protein drink called Premier Protein shake for breakfast or late night snack . It is great tasting,   very low sugar and available of  $2 serving at [facility] and  In bulk for $1.50/serving at [employer] and [employer]  .    Nutritional analysis :  160 cal  30 g protein  1 g sugar 50% calcium needs   Nicolette Bang and [employer]   Stick to [employer] by [employer] or [employer] Carb Smart  bars on a stick  for your ice cream.  3)  you are being referred to Dr. Tobi Bastos for follow up colonoscopy that was due in December  4) You are being prescribed augmentin  as a penicillin trial.  STOP IT IMMEDIATELY if you develop itching , rashh or swelling,  and take the prednisone taper along with 20 mg famotidine every 6 hours (this is OTC pepcid)

## 2023-02-03 NOTE — Progress Notes (Signed)
Subjective:  Patient ID: Sandra Phillips, female    DOB: 12-23-1950  Age: 72 y.o. MRN: [id number]  CC: The primary encounter diagnosis was Tubular adenoma of colon. 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   HPI Sandra Phillips presents for  Chief Complaint  Patient presents with   Medical Management of Chronic Issues   Last seen Jan 2024   1) she recently required abx for tooth abscess.  PCN was not used due unclear h/o allergies .  She recalls being told that she had a  PCN reaction at age 89.  No subsequent trials.  Needs to have abx for  an elective future dental procedure  and is requesting testing d  2) h/o DVT, followed by dx of endometrial CA:  reviewed options of continued lifelong anticoagulation vs stopping.  She has tolerated eliquis without bleeding and has no h/o falls.   3) tubular adenoma  : she is overdue for colonoscopy recommended as 6 month follow  up by Dr Sandra Phillips. Letter sent per EPIC bu not recevied by patient/   Dr Sandra Phillips notified   4) vitamin d deficiency   taking 1000 ius  daily   5) HTN:  taking telmisartan and metoprolol,  her   home readings 130/80 or lower   6)HLD: tolerating  Crestor     Outpatient Medications Prior to Visit  Medication Sig Dispense Refill   acetaminophen (TYLENOL) 500 MG tablet Take by mouth.     alendronate (FOSAMAX) 70 MG tablet Take 1 tablet (70 mg total) by mouth every 7 (seven) days. Take with a full glass of water on an empty stomach. 4 tablet 11   apixaban (ELIQUIS) 5 MG TABS tablet Take 1 tablet (5 mg total) by mouth 2 (two) times daily. 60 tablet 11   cetirizine (ZYRTEC) 10 MG tablet  Take 1 tablet (10 mg total) by mouth daily. 30 tablet 0   cholecalciferol (VITAMIN D3) 25 MCG (1000 UNIT) tablet Take 1,000 Units by mouth daily. Take 5 times a week     ELIQUIS 5 MG TABS tablet TAKE ONE (1) TABLET BY MOUTH TWO TIMES PER DAY 60 tablet 6   ergocalciferol (DRISDOL) 1.25 MG (50000 UT) capsule Take 1 capsule (50,000 Units total) by mouth once a week. 12 capsule 0   metoprolol succinate (TOPROL-XL) 25 MG 24 hr tablet TAKE 1 TABLET BY MOUTH DAILY 90 tablet 1   rosuvastatin (CRESTOR) 10 MG tablet Take 1 tablet (10 mg total) by mouth daily. 90 tablet 1   telmisartan (MICARDIS) 40 MG tablet TAKE ONE TABLET BY MOUTH AT BEDTIME 90 tablet 1   fluconazole (DIFLUCAN) 150 MG tablet Take 1 tablet (150 mg total) by mouth daily. Take 2nd tablet 72 hours later (Patient not taking: Reported on 08/01/2022) 2 tablet 0   predniSONE (DELTASONE) 10 MG tablet TAKE 3 TABLETS PO QD FOR 3 DAYS THEN TAKE 2 TABLETS PO QD FOR 3 DAYS THEN TAKE 1 TABLET PO QD FOR 3 DAYS THEN TAKE 1/2 TAB PO QD FOR 3 DAYS 20 tablet 0   No facility-administered medications prior to visit.    Review of Systems;  Patient denies headache, fevers, malaise, unintentional weight loss, skin rash, eye pain, sinus congestion and sinus  pain, sore throat, dysphagia,  hemoptysis , cough, dyspnea, wheezing, chest pain, palpitations, orthopnea, edema, abdominal pain, nausea, melena, diarrhea, constipation, flank pain, dysuria, hematuria, urinary  Frequency, nocturia, numbness, tingling, seizures,  Focal weakness, Loss of consciousness,  Tremor, insomnia, depression, anxiety, and suicidal ideation.      Objective:  BP 124/76   Pulse 69   Temp 97.7 F (36.5 C) (Oral)   Ht 5\' 7"  (1.702 m)   Wt 230 lb 12.8 oz (104.7 kg)   SpO2 96%   BMI 36.15 kg/m   BP Readings from Last 3 Encounters:  02/03/23 124/76  09/03/22 124/70  08/15/22 129/68    Wt Readings from Last 3 Encounters:  02/03/23 230 lb 12.8 oz (104.7 kg)  01/28/23 228 lb (103.4  kg)  09/03/22 229 lb 12.8 oz (104.2 kg)    Physical Exam Vitals reviewed.  Constitutional:      General: She is not in acute distress.    Appearance: Normal appearance. She is normal weight. She is not ill-appearing, toxic-appearing or diaphoretic.  HENT:     Head: Normocephalic.  Eyes:     General: No scleral icterus.       Right eye: No discharge.        Left eye: No discharge.     Conjunctiva/sclera: Conjunctivae normal.  Cardiovascular:     Rate and Rhythm: Normal rate and regular rhythm.     Heart sounds: Normal heart sounds.  Pulmonary:     Effort: Pulmonary effort is normal. No respiratory distress.     Breath sounds: Normal breath sounds.  Musculoskeletal:        General: Normal range of motion.  Skin:    General: Skin is warm and dry.  Neurological:     General: No focal deficit present.     Mental Status: She is alert and oriented to person, place, and time. Mental status is at baseline.  Psychiatric:        Mood and Affect: Mood normal.        Behavior: Behavior normal.        Thought Content: Thought content normal.        Judgment: Judgment normal.    Lab Results  Component Value Date   HGBA1C 5.6 02/03/2023   HGBA1C 5.5 12/05/2021    Lab Results  Component Value Date   CREATININE 0.85 02/03/2023   CREATININE 0.89 08/01/2022   CREATININE 0.81 05/06/2022    Lab Results  Component Value Date   WBC 5.7 02/03/2023   HGB 14.2 02/03/2023   HCT 42.3 02/03/2023   PLT 171.0 02/03/2023   GLUCOSE 106 (H) 02/03/2023   CHOL 227 (H) 12/05/2021   TRIG 123.0 12/05/2021   HDL 61.80 12/05/2021   LDLCALC 141 (H) 12/05/2021   ALT 24 02/03/2023   AST 27 02/03/2023   NA 140 02/03/2023   K 4.3 02/03/2023   CL 107 02/03/2023   CREATININE 0.85 02/03/2023   BUN 7 02/03/2023   CO2 25 02/03/2023   TSH 2.58 02/03/2023   INR 1.1 07/07/2021   HGBA1C 5.6 02/03/2023   MICROALBUR 0.8 04/10/2022    No results found.  Assessment & Plan:  .Tubular adenoma of  colon Assessment & Plan: She had 2 TAs and a sessile polyp removed in  June 2023.  Overdue for follow up . Urgent referral made   Orders: -     Ambulatory referral to Gastroenterology  Vitamin D deficiency Assessment & Plan: Currently taking 1000 ius daily  Orders: -     VITAMIN D 25 Hydroxy (Vit-D Deficiency, Fractures)  Obesity (BMI 35.0-39.9 without comorbidity) Assessment & Plan: I have addressed  BMI and recommended wt loss of 10% of body weight over the next 6 months using a low fat, fruit/vegetable based Mediteranean diet and regular exercise a minimum of 5 days per week.    Orders: -     Lipid Panel w/reflex Direct LDL -     Hemoglobin A1c -     TSH  Long term current use of anticoagulant therapy -     CBC with Differential/Platelet -     Comprehensive metabolic panel  Aortic atherosclerosis (HCC) Assessment & Plan: Findings from CT reviewed.  she is tolerating rosuvastatin.    Localized osteoporosis without current pathological fracture Assessment & Plan: She is tolerating  weekly alendronate .  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  Will need suspension prior to oral surgery and other procedures   Encounter for preventive health examination Assessment & Plan: age appropriate education and counseling updated, referrals for preventative services and immunizations addressed, dietary and smoking counseling addressed, most recent labs reviewed.  I have personally reviewed and have noted:   1) the patient's medical and social history 2) The pt's use of alcohol, tobacco, and illicit drugs 3) The patient's current medications and supplements 4) Functional ability including ADL's, fall risk, home safety risk, hearing and visual impairment 5) Diet and physical activities 6)  Evidence for depression or mood disorder 7) The patient's height, weight, and BMI have been recorded in the chart   I have made referrals, and provided counseling and education based on review of the above    Other orders -     Amoxicillin-Pot Clavulanate; Take 1 tablet by mouth 2 (two) times daily.  Dispense: 14 tablet; Refill: 0 -     predniSONE; 6 tablets on Day 1 , then reduce by 1 tablet daily until gone  Dispense: 21 tablet; Refill: 0      Follow-up: Return in about 6 months (around 08/06/2023) for physical.   Sherlene Shams, MD

## 2023-02-04 ENCOUNTER — Other Ambulatory Visit: Payer: Self-pay

## 2023-02-04 ENCOUNTER — Telehealth: Payer: Self-pay

## 2023-02-04 LAB — LIPID PANEL W/REFLEX DIRECT LDL
Cholesterol: 151 mg/dL (ref <200)
HDL: 52 mg/dL (ref >=50)
LDL Cholesterol (Calc): 74 mg/dL (calc)
Non-HDL Cholesterol (Calc): 99 mg/dL (calc) (ref <130)
Total CHOL/HDL Ratio: 2.9 (calc) (ref <5.0)
Triglycerides: 167 mg/dL — ABNORMAL HIGH (ref <150)

## 2023-02-04 MED ORDER — NA SULFATE-K SULFATE-MG SULF 17.5-3.13-1.6 GM/177ML PO SOLN: 354.0000 mL | Freq: Once | ORAL | 0 refills | Status: AC

## 2023-02-04 NOTE — Telephone Encounter (Signed)
Called patient and asked her when she would like to schedule her colonoscopy and she agreed on having it done on 03/17/23. I let the patient know that she will be receiving her instructions through MyChart and I would also mail them. I also let her know that I would be sending her prescription to [facility]. Patient understood and had no further questions.

## 2023-02-04 NOTE — Telephone Encounter (Signed)
Per Dr. Darrick Huntsman through secure chat: she should suspend 3 days prior to colonoscopy and resume same day if no polyps removed, but wait 2 days if polyps are removed.  Patient was informed.

## 2023-02-04 NOTE — Telephone Encounter (Signed)
Patient called back in to reschedule her colonoscopy. She will not be in town that weekend.

## 2023-02-04 NOTE — Telephone Encounter (Signed)
 FYI for you. 

## 2023-02-07 ENCOUNTER — Other Ambulatory Visit: Payer: Self-pay

## 2023-02-07 DIAGNOSIS — Z86010 Personal history of colonic polyps: Secondary | ICD-10-CM

## 2023-02-07 NOTE — Addendum Note (Signed)
Addended by: Adela Ports on: 02/07/2023 10:41 AM   Modules accepted: Orders

## 2023-03-14 ENCOUNTER — Other Ambulatory Visit: Payer: Self-pay

## 2023-03-14 ENCOUNTER — Encounter: Payer: Self-pay | Admitting: Gastroenterology

## 2023-03-19 ENCOUNTER — Encounter: Payer: Self-pay | Admitting: Gastroenterology

## 2023-03-20 ENCOUNTER — Other Ambulatory Visit: Payer: Self-pay

## 2023-03-20 ENCOUNTER — Encounter: Payer: Self-pay | Admitting: Gastroenterology

## 2023-03-20 ENCOUNTER — Ambulatory Visit: Payer: Medicare Other

## 2023-03-20 ENCOUNTER — Encounter
Admission: RE | Disposition: A | Discharge status: Home / Self Care | Payer: Self-pay | Source: Home / Self Care | Attending: Gastroenterology

## 2023-03-20 ENCOUNTER — Ambulatory Visit
Admission: RE | Admit: 2023-03-20 | Discharge: 2023-03-20 | Disposition: A | Discharge status: Home / Self Care | Payer: Medicare Other | Attending: Gastroenterology | Admitting: Gastroenterology

## 2023-03-20 DIAGNOSIS — Z1211 Encounter for screening for malignant neoplasm of colon: Secondary | ICD-10-CM | POA: Insufficient documentation

## 2023-03-20 DIAGNOSIS — Z860100 Personal history of colon polyps, unspecified: Secondary | ICD-10-CM

## 2023-03-20 DIAGNOSIS — R000 Tachycardia, unspecified: Secondary | ICD-10-CM | POA: Diagnosis not present

## 2023-03-20 HISTORY — PX: COLONOSCOPY WITH PROPOFOL: SHX5780

## 2023-03-20 HISTORY — DX: Essential (primary) hypertension: I10

## 2023-03-20 SURGERY — COLONOSCOPY WITH PROPOFOL
Anesthesia: General

## 2023-03-20 MED ORDER — PROPOFOL 10 MG/ML IV BOLUS
INTRAVENOUS | Status: DC | PRN
Start: 2023-03-20 — End: 2023-03-20
  Administered 2023-03-20: 20 mg via INTRAVENOUS
  Administered 2023-03-20: 50 mg via INTRAVENOUS

## 2023-03-20 MED ORDER — LIDOCAINE HCL (CARDIAC) PF 100 MG/5ML IV SOSY
PREFILLED_SYRINGE | INTRAVENOUS | Status: DC | PRN
  Administered 2023-03-20: 50 mg via INTRAVENOUS

## 2023-03-20 MED ORDER — PROPOFOL 500 MG/50ML IV EMUL
INTRAVENOUS | Status: DC | PRN
  Administered 2023-03-20: 120 ug/kg/min via INTRAVENOUS

## 2023-03-20 MED ORDER — SODIUM CHLORIDE 0.9 % IV SOLN: INTRAVENOUS | Status: DC

## 2023-03-20 MED ORDER — GLYCOPYRROLATE 0.2 MG/ML IJ SOLN
INTRAMUSCULAR | Status: AC
  Filled 2023-03-20: qty 1

## 2023-03-20 MED ORDER — LIDOCAINE HCL (PF) 2 % IJ SOLN
INTRAMUSCULAR | Status: AC
  Filled 2023-03-20: qty 5

## 2023-03-20 MED ORDER — EPHEDRINE 5 MG/ML INJ
INTRAVENOUS | Status: AC
  Filled 2023-03-20: qty 5

## 2023-03-20 MED ORDER — ONDANSETRON HCL 4 MG/2ML IJ SOLN
INTRAMUSCULAR | Status: AC
  Filled 2023-03-20: qty 2

## 2023-03-20 MED ORDER — PROPOFOL 1000 MG/100ML IV EMUL
INTRAVENOUS | Status: AC
  Filled 2023-03-20: qty 100

## 2023-03-20 MED ORDER — EPHEDRINE SULFATE (PRESSORS) 50 MG/ML IJ SOLN
INTRAMUSCULAR | Status: DC | PRN
  Administered 2023-03-20: 5 mg via INTRAVENOUS

## 2023-03-20 NOTE — Anesthesia Postprocedure Evaluation (Signed)
Anesthesia Post Note  Patient: Sandra Phillips  Procedure(s) Performed: COLONOSCOPY WITH PROPOFOL  Patient location during evaluation: Endoscopy Anesthesia Type: General Level of consciousness: awake and alert Pain management: pain level controlled Vital Signs Assessment: post-procedure vital signs reviewed and stable Respiratory status: spontaneous breathing, nonlabored ventilation, respiratory function stable and patient connected to nasal cannula oxygen Cardiovascular status: blood pressure returned to baseline and stable Postop Assessment: no apparent nausea or vomiting Anesthetic complications: no  No notable events documented.   Last Vitals:  Vitals:   03/20/23 0808 03/20/23 0818  BP: (!) 100/55 103/75  Pulse: 69   Resp: 17   Temp: (!) 36.1 C   SpO2: 98%     Last Pain:  Vitals:   03/20/23 0818  TempSrc:   PainSc: 0-No pain                 Stephanie Coup

## 2023-03-20 NOTE — Op Note (Signed)
[facility] Gastroenterology Patient Name: Sandra Phillips Procedure Date: 03/20/2023 7:48 AM MRN: [id number] Account #: [id number] Date of Birth: [birth year] Admit Type: Outpatient Age: 72 Room: [facility] ENDO ROOM 4 Gender: Female Note Status: Finalized Instrument Name: Prentice Docker [id number] Procedure:             Colonoscopy Indications:           Surveillance: Piecemeal removal of large sessile                         adenoma last colonoscopy (< 3 yrs), Last colonoscopy:                         June 2023 Providers:             Wyline Mood MD, MD Referring MD:          Duncan Dull, MD (Referring MD) Medicines:             Monitored Anesthesia Care Complications:         No immediate complications. Procedure:             Pre-Anesthesia Assessment:                        - Prior to the procedure, a History and Physical was                         performed, and patient medications, allergies and                         sensitivities were reviewed. The patient's tolerance                         of previous anesthesia was reviewed.                        - The risks and benefits of the procedure and the                         sedation options and risks were discussed with the                         patient. All questions were answered and informed                         consent was obtained.                        - ASA Grade Assessment: II - A patient with mild                         systemic disease.                        After obtaining informed consent, the colonoscope was                         passed under direct vision. Throughout the procedure,                         the patient's blood pressure,  pulse, and oxygen                         saturations were monitored continuously. The                         Colonoscope was introduced through the anus and                         advanced to the the cecum, identified by the                         appendiceal  orifice. The quality of the bowel                         preparation was excellent. The ileocecal valve,                         appendiceal orifice, and rectum were photographed. Findings:      The perianal and digital rectal examinations were normal.      The entire examined colon appeared normal on direct and retroflexion       views. Impression:            - The entire examined colon is normal on direct and                         retroflexion views.                        - No specimens collected. Recommendation:        - Discharge patient to home (with escort).                        - Resume previous diet.                        - Continue present medications.                        - Repeat colonoscopy in 3 years for surveillance. Procedure Code(s):     --- Professional ---                        [id number], Colonoscopy, flexible; diagnostic, including                         collection of specimen(s) by brushing or washing, when                         performed (separate procedure) Diagnosis Code(s):     --- Professional ---                        Z86.010, Personal history of colonic polyps CPT copyright 2022 American Medical Association. All rights reserved. The codes documented in this report are preliminary and upon coder review may  be revised to meet current compliance requirements. Wyline Mood, MD Wyline Mood MD, MD 03/20/2023 8:06:08 AM This report has been signed electronically. Number of Addenda: 0 Note Initiated On: 03/20/2023 7:48 AM Scope Withdrawal Time: 0 hours 7 minutes 10 seconds  [facility] Procedure  Duration: 0 hours 9 minutes 13 seconds  Estimated Blood Loss:  Estimated blood loss: none.      [facility]

## 2023-03-20 NOTE — Transfer of Care (Signed)
Immediate Anesthesia Transfer of Care Note  Patient: Sandra Phillips  Procedure(s) Performed: COLONOSCOPY WITH PROPOFOL  Patient Location: PACU  Anesthesia Type:MAC  Level of Consciousness: awake and patient cooperative  Airway & Oxygen Therapy: Patient Spontanous Breathing and Patient connected to face mask oxygen  Post-op Assessment: Report given to RN and Post -op Vital signs reviewed and stable  Post vital signs: Reviewed and stable  Last Vitals:  Vitals Value Taken Time  BP 100/55 03/20/23 0809  Temp 36.1 C 03/20/23 0808  Pulse 71 03/20/23 0809  Resp 16 03/20/23 0809  SpO2 97 % 03/20/23 0809  Vitals shown include unfiled device data.  Last Pain:  Vitals:   03/20/23 0808  TempSrc: Temporal  PainSc: 0-No pain         Complications: No notable events documented.

## 2023-03-20 NOTE — Anesthesia Preprocedure Evaluation (Signed)
Anesthesia Evaluation  Patient identified by MRN, date of birth, ID band Patient awake    Reviewed: Allergy & Precautions, NPO status , Patient's Chart, lab work & pertinent test results  Airway Mallampati: II  TM Distance: >3 FB Neck ROM: Full    Dental  (+) Teeth Intact   Pulmonary  Hx of bilateral PE post meniscal surgery 2019   Pulmonary exam normal breath sounds clear to auscultation       Cardiovascular Exercise Tolerance: Good hypertension, negative cardio ROS Normal cardiovascular exam Rhythm:Regular     Neuro/Psych negative neurological ROS  negative psych ROS   GI/Hepatic negative GI ROS, Neg liver ROS,,,  Endo/Other  negative endocrine ROS    Renal/GU      Musculoskeletal   Abdominal   Peds  Hematology negative hematology ROS (+)   Anesthesia Other Findings Past Medical History: No date: COVID-19     Comment:  12/13/21 No date: Current long-term use of anticoagulant medication with  history of deep venous thrombosis (DVT) No date: History of DVT (deep vein thrombosis) No date: Primary osteoarthritis  Past Surgical History: 06/12/2021: KNEE ARTHROSCOPY; Left     Comment:  Procedure: LEFT KNEE ARTHROSCOPY WITH DEBRIDEMENT AND               REPAIR OF A POSTERIOR MEDIAL ROOT TEAR AND ABRASION               CONDOPLASTY;  Surgeon: Christena Flake, MD;  Location: [facility]              ORS;  Service: Orthopedics;  Laterality: Left; 07/09/2021: PULMONARY THROMBECTOMY; N/A     Comment:  Procedure: PULMONARY THROMBECTOMY;  Surgeon: Annice Needy, MD;  Location: [facility] INVASIVE CV LAB;  Service:               Cardiovascular;  Laterality: N/A;  BMI    Body Mass Index: 34.93 kg/m      Reproductive/Obstetrics negative OB ROS                             Anesthesia Physical Anesthesia Plan  ASA: 3  Anesthesia Plan: General   Post-op Pain Management: Minimal or no pain  anticipated   Induction: Intravenous  PONV Risk Score and Plan: 3 and Ondansetron, Propofol infusion and TIVA  Airway Management Planned: Natural Airway and Nasal Cannula  Additional Equipment:   Intra-op Plan:   Post-operative Plan:   Informed Consent: I have reviewed the patients History and Physical, chart, labs and discussed the procedure including the risks, benefits and alternatives for the proposed anesthesia with the patient or authorized representative who has indicated his/her understanding and acceptance.     Dental Advisory Given  Plan Discussed with: Anesthesiologist, CRNA and Surgeon  Anesthesia Plan Comments: (Patient consented for risks of anesthesia including but not limited to:  - adverse reactions to medications - risk of airway placement if required - damage to eyes, teeth, lips or other oral mucosa - nerve damage due to positioning  - sore throat or hoarseness - Damage to heart, brain, nerves, lungs, other parts of body or loss of life  Patient voiced understanding.)       Anesthesia Quick Evaluation

## 2023-03-20 NOTE — H&P (Signed)
Wyline Mood, MD [address], [address], [place], [place], [address] [address], [place], [place], [address] Phone: [phone]  Fax: [phone]  Primary Care Physician:  Sherlene Shams, MD   Pre-Procedure History & Physical: HPI:  Sandra Phillips is a 72 y.o. female is here for an colonoscopy.   Past Medical History:  Diagnosis Date   COVID-19    12/13/21   Current long-term use of anticoagulant medication with history of deep venous thrombosis (DVT)    History of DVT (deep vein thrombosis)    History of radiation therapy    Vagina (HDR) 06/26/22-07/10/22- Dr. Antony Blackbird   Hypertension    Primary osteoarthritis     Past Surgical History:  Procedure Laterality Date   COLONOSCOPY WITH PROPOFOL N/A 01/11/2022   Procedure: COLONOSCOPY WITH PROPOFOL;  Surgeon: Wyline Mood, MD;  Location: [facility] ENDOSCOPY;  Service: Gastroenterology;  Laterality: N/A;   KNEE ARTHROSCOPY Left 06/12/2021   Procedure: LEFT KNEE ARTHROSCOPY WITH DEBRIDEMENT AND REPAIR OF A POSTERIOR MEDIAL ROOT TEAR AND ABRASION CONDOPLASTY;  Surgeon: Christena Flake, MD;  Location: [facility] ORS;  Service: Orthopedics;  Laterality: Left;   PULMONARY THROMBECTOMY N/A 07/09/2021   Procedure: PULMONARY THROMBECTOMY;  Surgeon: Annice Needy, MD;  Location: [facility] INVASIVE CV LAB;  Service: Cardiovascular;  Laterality: N/A;   ROBOTIC ASSISTED TOTAL HYSTERECTOMY  03/2022    Prior to Admission medications   Medication Sig Start Date End Date Taking? Authorizing Provider  metoprolol succinate (TOPROL-XL) 25 MG 24 hr tablet TAKE 1 TABLET BY MOUTH DAILY 12/18/22  Yes Sherlene Shams, MD  rosuvastatin (CRESTOR) 10 MG tablet Take 1 tablet (10 mg total) by mouth daily. 10/31/22  Yes Sherlene Shams, MD  telmisartan (MICARDIS) 40 MG tablet TAKE ONE TABLET BY MOUTH AT BEDTIME 12/18/22  Yes Sherlene Shams, MD  acetaminophen (TYLENOL) 500 MG tablet Take by mouth.    [provider]  alendronate (FOSAMAX) 70 MG tablet Take 1  tablet (70 mg total) by mouth every 7 (seven) days. Take with a full glass of water on an empty stomach. Patient not taking: Reported on 03/20/2023 04/29/22   Sherlene Shams, MD  amoxicillin-clavulanate (AUGMENTIN) 875-125 MG tablet Take 1 tablet by mouth 2 (two) times daily. Patient not taking: Reported on 03/20/2023 02/03/23   Sherlene Shams, MD  apixaban (ELIQUIS) 5 MG TABS tablet Take 1 tablet (5 mg total) by mouth 2 (two) times daily. 08/02/22   Annice Needy, MD  cetirizine (ZYRTEC) 10 MG tablet Take 1 tablet (10 mg total) by mouth daily. 09/03/22   Bethanie Dicker, NP  cholecalciferol (VITAMIN D3) 25 MCG (1000 UNIT) tablet Take 1,000 Units by mouth daily. Take 5 times a week    [provider]  ELIQUIS 5 MG TABS tablet TAKE ONE (1) TABLET BY MOUTH TWO TIMES PER DAY 03/19/22   Annice Needy, MD  ergocalciferol (DRISDOL) 1.25 MG (50000 UT) capsule Take 1 capsule (50,000 Units total) by mouth once a week. 05/08/22   Sherlene Shams, MD  predniSONE (DELTASONE) 10 MG tablet 6 tablets on Day 1 , then reduce by 1 tablet daily until gone Patient not taking: Reported on 03/20/2023 02/03/23   Sherlene Shams, MD    Allergies as of 02/07/2023 - Review Complete 02/03/2023  Allergen Reaction Noted   Penicillins  06/04/2021   Shrimp extract Swelling 06/11/2021   Shrimp flavor Swelling 06/11/2021    Family History  Problem Relation Age of  Onset   Breast cancer Mother 15       pt also has bone and bladder   Cancer Mother    Rheum arthritis Sister     Social History   Socioeconomic History   Marital status: Married    Spouse name: Not on file   Number of children: Not on file   Years of education: Not on file   Highest education level: Not on file  Occupational History   Not on file  Tobacco Use   Smoking status: Never   Smokeless tobacco: Never  Vaping Use   Vaping status: Never Used  Substance and Sexual Activity   Alcohol use: Yes    Alcohol/week: 1.0 standard drink of alcohol     Types: 1 Glasses of wine per week    Comment: a glass at dinner   Drug use: Never   Sexual activity: Not Currently  Other Topics Concern   Not on file  Social History Narrative   Married   Social Determinants of Health   Financial Resource Strain: Low Risk  (01/28/2023)   Overall Financial Resource Strain (CARDIA)    Difficulty of Paying Living Expenses: Not hard at all  Food Insecurity: No Food Insecurity (01/28/2023)   Hunger Vital Sign    Worried About Running Out of Food in the Last Year: Never true    Ran Out of Food in the Last Year: Never true  Transportation Needs: No Transportation Needs (01/28/2023)   PRAPARE - Administrator, Civil Service (Medical): No    Lack of Transportation (Non-Medical): No  Physical Activity: Insufficiently Active (01/28/2023)   Exercise Vital Sign    Days of Exercise per Week: 3 days    Minutes of Exercise per Session: 30 min  Stress: No Stress Concern Present (01/28/2023)   [employer] of Occupational Health - Occupational Stress Questionnaire    Feeling of Stress : Not at all  Social Connections: Socially Integrated (01/28/2023)   Social Connection and Isolation Panel [NHANES]    Frequency of Communication with Friends and Family: More than three times a week    Frequency of Social Gatherings with Friends and Family: More than three times a week    Attends Religious Services: More than 4 times per year    Active Member of [employer] or Organizations: Yes    Attends Engineer, structural: More than 4 times per year    Marital Status: Married  Catering manager Violence: Not At Risk (01/28/2023)   Humiliation, Afraid, Rape, and Kick questionnaire    Fear of Current or Ex-Partner: No    Emotionally Abused: No    Physically Abused: No    Sexually Abused: No    Review of Systems: See HPI, otherwise negative ROS  Physical Exam: BP 137/87   Pulse 81   Temp (!) 97.2 F (36.2 C) (Temporal)   Resp 16   Ht 5\' 7"  (1.702 m)   Wt  102.1 kg   SpO2 97%   BMI 35.24 kg/m  General:   Alert,  pleasant and cooperative in NAD Head:  Normocephalic and atraumatic. Neck:  Supple; no masses or thyromegaly. Lungs:  Clear throughout to auscultation, normal respiratory effort.    Heart:  +S1, +S2, Regular rate and rhythm, No edema. Abdomen:  Soft, nontender and nondistended. Normal bowel sounds, without guarding, and without rebound.   Neurologic:  Alert and  oriented x4;  grossly normal neurologically.  Impression/Plan: ABRIELLA STEAGALL is here for an  colonoscopy to be performed for surveillance due to prior history of colon polyps   Risks, benefits, limitations, and alternatives regarding  colonoscopy have been reviewed with the patient.  Questions have been answered.  All parties agreeable.   Wyline Mood, MD  03/20/2023, 7:47 AM

## 2023-03-21 ENCOUNTER — Encounter: Payer: Self-pay | Admitting: Gastroenterology

## 2023-04-10 DIAGNOSIS — C541 Malignant neoplasm of endometrium: Secondary | ICD-10-CM | POA: Diagnosis not present

## 2023-05-06 ENCOUNTER — Other Ambulatory Visit: Payer: Self-pay | Admitting: Internal Medicine

## 2023-07-10 DIAGNOSIS — C541 Malignant neoplasm of endometrium: Secondary | ICD-10-CM | POA: Diagnosis not present

## 2023-08-07 ENCOUNTER — Ambulatory Visit: Payer: Medicare Other | Admitting: Internal Medicine

## 2023-08-07 ENCOUNTER — Encounter: Payer: Self-pay | Admitting: Internal Medicine

## 2023-08-07 VITALS — BP 128/80 | HR 65 | Temp 98.5°F | Ht 67.0 in | Wt 233.8 lb

## 2023-08-07 DIAGNOSIS — E669 Obesity, unspecified: Secondary | ICD-10-CM | POA: Diagnosis not present

## 2023-08-07 DIAGNOSIS — E559 Vitamin D deficiency, unspecified: Secondary | ICD-10-CM

## 2023-08-07 DIAGNOSIS — Z23 Encounter for immunization: Secondary | ICD-10-CM

## 2023-08-07 DIAGNOSIS — I10 Essential (primary) hypertension: Secondary | ICD-10-CM | POA: Diagnosis not present

## 2023-08-07 DIAGNOSIS — E785 Hyperlipidemia, unspecified: Secondary | ICD-10-CM | POA: Diagnosis not present

## 2023-08-07 DIAGNOSIS — I2609 Other pulmonary embolism with acute cor pulmonale: Secondary | ICD-10-CM

## 2023-08-07 DIAGNOSIS — Z1231 Encounter for screening mammogram for malignant neoplasm of breast: Secondary | ICD-10-CM

## 2023-08-07 DIAGNOSIS — I2782 Chronic pulmonary embolism: Secondary | ICD-10-CM

## 2023-08-07 DIAGNOSIS — D126 Benign neoplasm of colon, unspecified: Secondary | ICD-10-CM

## 2023-08-07 DIAGNOSIS — Z6836 Body mass index (BMI) 36.0-36.9, adult: Secondary | ICD-10-CM

## 2023-08-07 DIAGNOSIS — Z0000 Encounter for general adult medical examination without abnormal findings: Secondary | ICD-10-CM | POA: Diagnosis not present

## 2023-08-07 DIAGNOSIS — E1169 Type 2 diabetes mellitus with other specified complication: Secondary | ICD-10-CM

## 2023-08-07 DIAGNOSIS — C541 Malignant neoplasm of endometrium: Secondary | ICD-10-CM

## 2023-08-07 LAB — LIPID PANEL
Cholesterol: 161 mg/dL (ref 0–200)
HDL: 60 mg/dL (ref >39.00)
LDL Cholesterol: 77 mg/dL (ref 0–99)
NonHDL: 101.28
Total CHOL/HDL Ratio: 3
Triglycerides: 120 mg/dL (ref 0.0–149.0)
VLDL: 24 mg/dL (ref 0.0–40.0)

## 2023-08-07 LAB — COMPREHENSIVE METABOLIC PANEL
ALT: 19 U/L (ref 0–35)
AST: 20 U/L (ref 0–37)
Albumin: 4.5 g/dL (ref 3.5–5.2)
Alkaline Phosphatase: 90 U/L (ref 39–117)
BUN: 13 mg/dL (ref 6–23)
CO2: 27 meq/L (ref 19–32)
Calcium: 9.2 mg/dL (ref 8.4–10.5)
Chloride: 103 meq/L (ref 96–112)
Creatinine, Ser: 0.89 mg/dL (ref 0.40–1.20)
GFR: 65.8 mL/min (ref >60.00)
Glucose, Bld: 97 mg/dL (ref 70–99)
Potassium: 3.9 meq/L (ref 3.5–5.1)
Sodium: 139 meq/L (ref 135–145)
Total Bilirubin: 0.7 mg/dL (ref 0.2–1.2)
Total Protein: 7.6 g/dL (ref 6.0–8.3)

## 2023-08-07 LAB — CBC WITH DIFFERENTIAL/PLATELET
Basophils Absolute: 0 10*3/uL (ref 0.0–0.1)
Basophils Relative: 0.2 % (ref 0.0–3.0)
Eosinophils Absolute: 0.2 10*3/uL (ref 0.0–0.7)
Eosinophils Relative: 2.9 % (ref 0.0–5.0)
HCT: 43.5 % (ref 36.0–46.0)
Hemoglobin: 14.8 g/dL (ref 12.0–15.0)
Lymphocytes Relative: 18 % (ref 12.0–46.0)
Lymphs Abs: 1 10*3/uL (ref 0.7–4.0)
MCHC: 34.1 g/dL (ref 30.0–36.0)
MCV: 95.9 fL (ref 78.0–100.0)
Monocytes Absolute: 0.5 10*3/uL (ref 0.1–1.0)
Monocytes Relative: 8.4 % (ref 3.0–12.0)
Neutro Abs: 4 10*3/uL (ref 1.4–7.7)
Neutrophils Relative %: 70.5 % (ref 43.0–77.0)
Platelets: 212 10*3/uL (ref 150.0–400.0)
RBC: 4.53 Mil/uL (ref 3.87–5.11)
RDW: 13.6 % (ref 11.5–15.5)
WBC: 5.7 10*3/uL (ref 4.0–10.5)

## 2023-08-07 LAB — MICROALBUMIN / CREATININE URINE RATIO
Creatinine,U: 60.9 mg/dL
Microalb Creat Ratio: 1.1 mg/g (ref 0.0–30.0)
Microalb, Ur: <0.7 mg/dL (ref 0.0–1.9)

## 2023-08-07 LAB — TSH: TSH: 3.45 u[IU]/mL (ref 0.35–5.50)

## 2023-08-07 LAB — VITAMIN D 25 HYDROXY (VIT D DEFICIENCY, FRACTURES): VITD: 26.06 ng/mL — ABNORMAL LOW (ref 30.00–100.00)

## 2023-08-07 LAB — LDL CHOLESTEROL, DIRECT: Direct LDL: 81 mg/dL

## 2023-08-07 LAB — HEMOGLOBIN A1C: Hgb A1c MFr Bld: 5.7 % (ref 4.6–6.5)

## 2023-08-07 MED ORDER — METOPROLOL SUCCINATE ER 25 MG PO TB24: 25.0000 mg | ORAL_TABLET | Freq: Every day | ORAL | 1 refills | Status: DC

## 2023-08-07 MED ORDER — TELMISARTAN 40 MG PO TABS: 40.0000 mg | ORAL_TABLET | Freq: Every day | ORAL | 1 refills | Status: DC

## 2023-08-07 NOTE — Assessment & Plan Note (Signed)
Normal colonscopy August 2024.  Follow up in 2027

## 2023-08-07 NOTE — Assessment & Plan Note (Addendum)
Lifelong anticoagulation recommended as long as she remains ambulatory without falls.  Currently using Eliqius,  which cost her $400 this month .  Reassured that the cos will return to $100 or less in Feb and to contact me if it does not.   Reminded that she will need suspension prior to oral surgery and other procedures

## 2023-08-07 NOTE — Assessment & Plan Note (Signed)
I have addressed  BMI and recommended wt loss of 10% of body weight over the next 6 months using a low fat, fruit/vegetable based [place] diet and regular exercise a minimum of 5 days per week.   

## 2023-08-07 NOTE — Assessment & Plan Note (Signed)
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 3 month follow up with Dr Clifton James was normal in Dec 2023.

## 2023-08-07 NOTE — Assessment & Plan Note (Signed)
She is tolerating  weekly alendronate .  Vitamin d supplementation  continue continue

## 2023-08-07 NOTE — Patient Instructions (Addendum)
Your annual mammogram has been ordered and is overdue.  (Last one was 2023)   Sandra Phillips will not allow [name] to schedule it for you,  so please  call to make your appointment [phone]    I applaud your determination to lose weight on your own.  Here are some prepared foods that will help keep you from blowing your diet  on larger portion sizes   Healthy Choice  "low carb power bowl"  entrees and  "Steamer" entrees are are great low carb entrees that microwave in 5 minutes      [name] yogurt is loaded with sugar unless you choose unflavored (NOT VANILLA ) .  OR YOU CAN buy one of these brands that use artificial or natural low glycemic index sweeteners:  Dannon Lt n Fit Greek 2 Good 2 Be True Oikos Triple Zero   You can also  a premixed protein drink L Premier Protein' Fairlife Muscle Milk Atkins Aadvantage   .    [name] and Veggies made [name] are two companies that make individual servings of  low carb frittata's (quiche without the crust)    Healthy /choice makes low cal/carb fudgsicles and brownies  , and KIND makes low carb mini sized bars  for snacks

## 2023-08-07 NOTE — Progress Notes (Signed)
Patient ID: Sandra Phillips, female    DOB: March 01, 1951  Age: 73 y.o. MRN: [id number]  The patient is here for annual preventive examination and management of other chronic and acute problems.   The risk factors are reflected in the social history.  The roster of all physicians providing medical care to patient - is listed in the Snapshot section of the chart.  Activities of daily living:  The patient is 100% independent in all ADLs: dressing, toileting, feeding as well as independent mobility  Home safety : The patient has smoke detectors in the home. They wear seatbelts.  There are no firearms at home. There is no violence in the home.   There is no risks for hepatitis, STDs or HIV. There is no   history of blood transfusion. They have no travel history to infectious disease endemic areas of the world.  The patient has seen their dentist in the last six month. They have seen their eye doctor in the last year. They admit to slight hearing difficulty with regard to whispered voices and some television programs.  They have deferred audiologic testing in the last year.  They do not  have excessive sun exposure. Discussed the need for sun protection: hats, long sleeves and use of sunscreen if there is significant sun exposure.   Diet: the importance of a healthy diet is discussed. They do have a healthy diet.  The benefits of regular aerobic exercise were discussed. She walks 4 times per week ,  20 minutes.   Depression screen: there are no signs or vegative symptoms of depression- irritability, change in appetite, anhedonia, sadness/tearfullness.  Cognitive assessment: the patient manages all their financial and personal affairs and is actively engaged. They could relate day,date,year and events; recalled 2/3 objects at 3 minutes; performed clock-face test normally.  The following portions of the patient's history were reviewed and updated as appropriate: allergies, current medications, past family  history, past medical history,  past surgical history, past social history  and problem list.  Visual acuity was not assessed per patient preference since she has regular follow up with her ophthalmologist. Hearing and body mass index were assessed and reviewed.   During the course of the visit the patient was educated and counseled about appropriate screening and preventive services including : fall prevention , diabetes screening, nutrition counseling, colorectal cancer screening, and recommended immunizations.    CC: The primary encounter diagnosis was Encounter for screening mammogram for malignant neoplasm of breast. 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  Husband  and patient are motivated to lose weight    History Sandra Phillips has a past medical history of Allergy, Cancer (HCC), COVID-19, Current long-term use of anticoagulant medication with history of deep venous thrombosis (DVT), History of DVT (deep vein thrombosis), History of radiation therapy, Hypertension, and Primary osteoarthritis.   She has a past surgical history that includes Knee arthroscopy (Left, 06/12/2021); PULMONARY THROMBECTOMY (N/A, 07/09/2021); Colonoscopy with propofol (N/A, 01/11/2022); Robotic assisted total hysterectomy (03/2022); Colonoscopy with propofol (N/A, 03/20/2023); and Abdominal hysterectomy.   Her family history includes Breast cancer (age of onset: 27) in  her mother; Cancer in her mother; Rheum arthritis in her sister.She reports that she has never  smoked. She has never used smokeless tobacco. She reports current alcohol use of about 1.0 standard drink of alcohol per week. She reports that she does not use drugs.  Outpatient Medications Prior to Visit  Medication Sig Dispense Refill  . acetaminophen (TYLENOL) 500 MG tablet Take by mouth.    Marland Kitchen apixaban (ELIQUIS) 5 MG TABS tablet Take 1 tablet (5 mg total) by mouth 2 (two) times daily. 60 tablet 11  . rosuvastatin (CRESTOR) 10 MG tablet TAKE 1 TABLET BY MOUTH ONCE DAILY 90 tablet 1  . alendronate (FOSAMAX) 70 MG tablet Take 1 tablet (70 mg total) by mouth every 7 (seven) days. Take with a full glass of water on an empty stomach. (Patient not taking: Reported on 03/20/2023) 4 tablet 11  . amoxicillin-clavulanate (AUGMENTIN) 875-125 MG tablet Take 1 tablet by mouth 2 (two) times daily. (Patient not taking: Reported on 03/20/2023) 14 tablet 0  . cetirizine (ZYRTEC) 10 MG tablet Take 1 tablet (10 mg total) by mouth daily. (Patient not taking: Reported on 08/07/2023) 30 tablet 0  . cholecalciferol (VITAMIN D3) 25 MCG (1000 UNIT) tablet Take 1,000 Units by mouth daily. Take 5 times a week (Patient not taking: Reported on 08/07/2023)    . ELIQUIS 5 MG TABS tablet TAKE ONE (1) TABLET BY MOUTH TWO TIMES PER DAY 60 tablet 6  . ergocalciferol (DRISDOL) 1.25 MG (50000 UT) capsule Take 1 capsule (50,000 Units total) by mouth once a week. 12 capsule 0  . predniSONE (DELTASONE) 10 MG tablet 6 tablets on Day 1 , then reduce by 1 tablet daily until gone (Patient not taking: Reported on 03/20/2023) 21 tablet 0  . metoprolol succinate (TOPROL-XL) 25 MG 24 hr tablet TAKE 1 TABLET BY MOUTH DAILY 90 tablet 1  . telmisartan (MICARDIS) 40 MG tablet TAKE ONE TABLET BY MOUTH AT BEDTIME 90 tablet 1   No facility-administered medications prior to visit.    Review of Systems  Patient denies headache, fevers, malaise, unintentional weight loss, skin rash, eye pain, sinus congestion and sinus pain, sore throat, dysphagia,   hemoptysis , cough, dyspnea, wheezing, chest pain, palpitations, orthopnea, edema, abdominal pain, nausea, melena, diarrhea, constipation, flank pain, dysuria, hematuria, urinary  Frequency, nocturia, numbness, tingling, seizures,  Focal weakness, Loss of consciousness,  Tremor, insomnia, depression, anxiety, and suicidal ideation.     Objective:  BP 128/80   Pulse 65   Temp 98.5 F (36.9 C) (Oral)   Ht 5\' 7"  (1.702 m)   Wt 233 lb 12.8 oz (106.1 kg)   SpO2 95%   BMI 36.62 kg/m   Physical Exam Vitals reviewed.  Constitutional:      General: She is not in acute distress.    Appearance: Normal appearance. She is well-developed and normal weight. She is not ill-appearing, toxic-appearing or diaphoretic.  HENT:     Head: Normocephalic.     Right Ear: Tympanic membrane, ear canal and external ear normal. There is no impacted cerumen.     Left Ear: Tympanic membrane, ear canal and external ear normal. There is no impacted cerumen.     Nose: Nose normal.     Mouth/Throat:     Mouth: Mucous membranes are moist.     Pharynx: Oropharynx is clear.  Eyes:     General: No scleral icterus.       Right eye: No discharge.  Left eye: No discharge.     Conjunctiva/sclera: Conjunctivae normal.     Pupils: Pupils are equal, round, and reactive to light.  Neck:     Thyroid: No thyromegaly.     Vascular: No carotid bruit or JVD.  Cardiovascular:     Rate and Rhythm: Normal rate and regular rhythm.     Heart sounds: Normal heart sounds.  Pulmonary:     Effort: Pulmonary effort is normal. No respiratory distress.     Breath sounds: Normal breath sounds.  Chest:  Breasts:    Breasts are symmetrical.     Right: Normal. No swelling, inverted nipple, mass, nipple discharge, skin change or tenderness.     Left: Normal. No swelling, inverted nipple, mass, nipple discharge, skin change or tenderness.  Abdominal:     General: Bowel sounds are normal.     Palpations: Abdomen is soft. There is no  mass.     Tenderness: There is no abdominal tenderness. There is no guarding or rebound.  Musculoskeletal:        General: Normal range of motion.     Cervical back: Normal range of motion and neck supple.  Lymphadenopathy:     Cervical: No cervical adenopathy.     Upper Body:     Right upper body: No supraclavicular, axillary or pectoral adenopathy.     Left upper body: No supraclavicular, axillary or pectoral adenopathy.  Skin:    General: Skin is warm and dry.  Neurological:     General: No focal deficit present.     Mental Status: She is alert and oriented to person, place, and time. Mental status is at baseline.  Psychiatric:        Mood and Affect: Mood normal.        Behavior: Behavior normal.        Thought Content: Thought content normal.        Judgment: Judgment normal.     Assessment & Plan:  Encounter for screening mammogram for malignant neoplasm of breast -     3D Screening Mammogram, Left and Right; Future  Essential hypertension Assessment & Plan: Well controlled on current regimen. Renal function stable, no changes today.   Orders: -     Comprehensive metabolic panel -     Microalbumin / creatinine urine ratio  Obesity (BMI 35.0-39.9 without comorbidity) Assessment & Plan: I have addressed  BMI and recommended wt loss of 10% of body weight over the next 6 months using a low fat, fruit/vegetable based [place] diet and regular exercise a minimum of 5 days per week.    Orders: -     Hemoglobin A1c -     CBC with Differential/Platelet -     TSH  Hyperlipidemia associated with type 2 diabetes mellitus (HCC) -     Lipid panel -     LDL cholesterol, direct  Need for influenza vaccination -     Flu Vaccine Trivalent High Dose (Fluad)  Vitamin D deficiency  Endometrial cancer (HCC) Assessment & Plan: She has completed   vaginal cuff brachytherapy by radiation oncology to reduce risk of recurrence and had no adverse effects. 3 month follow up with  Dr Clifton James was normal in Dec 2023.    Encounter for preventive health examination Assessment & Plan: age appropriate education and counseling updated, referrals for preventative services and immunizations addressed, dietary and smoking counseling addressed, most recent labs reviewed.  I have personally reviewed and have noted:   1) the patient's  medical and social history 2) The pt's use of alcohol, tobacco, and illicit drugs 3) The patient's current medications and supplements 4) Functional ability including ADL's, fall risk, home safety risk, hearing and visual impairment 5) Diet and physical activities 6) Evidence for depression or mood disorder 7) The patient's height, weight, and BMI have been recorded in the chart 8) I have ordered and reviewed a 12 lead EKG and find that there are no acute changes and patient is in sinus rhythm.     I have made referrals, and provided counseling and education based on review of the above    Tubular adenoma of colon Assessment & Plan: Normal colonscopy August 2024.  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  Currently using Eliqius,  which cost her $400 this month .  Reassured that the cos will return to $100 or less in Feb and to contact me if it does not.   Reminded that she will need suspension prior to oral surgery and other procedures   Other orders -     Metoprolol Succinate ER; Take 1 tablet (25 mg total) by mouth daily.  Dispense: 90 tablet; Refill: 1 -     Telmisartan; Take 1 tablet (40 mg total) by mouth at bedtime.  Dispense: 90 tablet; Refill: 1      I provided 40 minutes of  face-to-face time during this encounter reviewing patient's current problems and past surgeries,  recent labs and imaging studies, providing counseling on the above mentioned  problems , and coordination  of care .   Follow-up: Return in about 6 months (around 02/04/2024) for hypertension.   Sherlene Shams, MD

## 2023-08-07 NOTE — Assessment & Plan Note (Signed)

## 2023-08-07 NOTE — Assessment & Plan Note (Signed)
Well controlled on current regimen. Renal function stable, no changes today. 

## 2023-08-26 ENCOUNTER — Telehealth (INDEPENDENT_AMBULATORY_CARE_PROVIDER_SITE_OTHER): Payer: Self-pay

## 2023-08-26 ENCOUNTER — Other Ambulatory Visit (INDEPENDENT_AMBULATORY_CARE_PROVIDER_SITE_OTHER): Payer: Self-pay | Admitting: Vascular Surgery

## 2023-08-26 NOTE — Telephone Encounter (Signed)
Error while looking for a visit notes

## 2023-09-04 ENCOUNTER — Ambulatory Visit: Payer: Medicare Other | Admitting: Family

## 2023-09-04 VITALS — BP 120/78 | HR 71 | Temp 98.7°F | Ht 67.0 in | Wt 235.0 lb

## 2023-09-04 DIAGNOSIS — R6889 Other general symptoms and signs: Secondary | ICD-10-CM | POA: Diagnosis not present

## 2023-09-04 LAB — POCT INFLUENZA A/B
Influenza A, POC: NEGATIVE
Influenza B, POC: NEGATIVE

## 2023-09-04 MED ORDER — OSELTAMIVIR PHOSPHATE 75 MG PO CAPS
75.0000 mg | ORAL_CAPSULE | Freq: Two times a day (BID) | ORAL | 0 refills | Status: DC
Start: 2023-09-04 — End: 2024-02-04

## 2023-09-04 MED ORDER — GUAIFENESIN-CODEINE 100-10 MG/5ML PO SOLN
5.0000 mL | Freq: Every evening | ORAL | 0 refills | Status: DC | PRN
Start: 2023-09-04 — End: 2024-02-02

## 2023-09-04 NOTE — Patient Instructions (Signed)
Start tamiflu Most common side effects are nausea, vomiting, diarrhea. I have also provided to with a codeine-based cough syrup.  Please take cough medication at night only as needed. As we discussed, I do not recommend dosing throughout the day as coughing is a protective mechanism . It also helps to break up thick mucous.  Do not take cough suppressants with alcohol as can lead to trouble breathing. Advise caution if taking cough suppressant and operating machinery ( i.e driving a car) as you may feel very tired.  It is imperative that you store this medication away from children, pets.  Please store this in a safe place so that no one else is able to use     Flu may be able to be spread to others 1 day prior to symptom onset and up to 5 to 7 days.  Please remain in quarantine until you are 24 hours without fever and WITHOUT medication such as Tylenol or ibuprofen.  You may return to work/school once you are fever free per above for 24 hours and all symptoms nearly resolved.  It is reasonable to take further precautions especially if you are around young children, or anyone that is immunocompromised, including wearing a mask, informing  those around you, and extending quarantine.   If you develop shortness of breath, chest pain, palpitations, worsening fever, or inability to stay hydrated, please call 911 or report to the nearest emergency room.  Influenza, Adult Influenza is also called "the flu." It is an infection in the lungs, nose, and throat (respiratory tract). It spreads easily from person to person (is contagious). The flu causes symptoms that are like a cold, along with high fever and body aches. What are the causes? This condition is caused by the influenza virus. You can get the virus by: Breathing in droplets that are in the air after a person infected with the flu coughed or sneezed. Touching something that has the virus on it and then touching your mouth, nose, or eyes. What  increases the risk? Certain things may make you more likely to get the flu. These include: Not washing your hands often. Having close contact with many people during cold and flu season. Touching your mouth, eyes, or nose without first washing your hands. Not getting a flu shot every year. You may have a higher risk for the flu, and serious problems, such as a lung infection (pneumonia), if you: Are older than 65. Are pregnant. Have a weakened disease-fighting system (immune system) because of a disease or because you are taking certain medicines. Have a long-term (chronic) condition, such as: Heart, kidney, or lung disease. Diabetes. Asthma. Have a liver disorder. Are very overweight (morbidly obese). Have anemia. What are the signs or symptoms? Symptoms usually begin suddenly and last 4-14 days. They may include: Fever and chills. Headaches, body aches, or muscle aches. Sore throat. Cough. Runny or stuffy (congested) nose. Feeling discomfort in your chest. Not wanting to eat as much as normal. Feeling weak or tired. Feeling dizzy. Feeling sick to your stomach or throwing up. How is this treated? If the flu is found early, you can be treated with antiviral medicine. This can help to reduce how bad the illness is and how long it lasts. This may be given by mouth or through an IV tube. Taking care of yourself at home can help your symptoms get better. Your doctor may want you to: Take over-the-counter medicines. Drink plenty of fluids. The flu often goes away  on its own. If you have very bad symptoms or other problems, you may be treated in a hospital. Follow these instructions at home:   Activity Rest as needed. Get plenty of sleep. Stay home from work or school as told by your doctor. Do not leave home until you do not have a fever for 24 hours without taking medicine. Leave home only to go to your doctor. Eating and drinking Take an ORS (oral rehydration solution). This  is a drink that is sold at pharmacies and stores. Drink enough fluid to keep your pee pale yellow. Drink clear fluids in small amounts as you are able. Clear fluids include: Water. Ice chips. Fruit juice mixed with water. Low-calorie sports drinks. Eat bland foods that are easy to digest. Eat small amounts as you are able. These foods include: Bananas. Applesauce. Rice. Lean meats. Toast. Crackers. Do not eat or drink: Fluids that have a lot of sugar or caffeine. Alcohol. Spicy or fatty foods. General instructions Take over-the-counter and prescription medicines only as told by your doctor. Use a cool mist humidifier to add moisture to the air in your home. This can make it easier for you to breathe. When using a cool mist humidifier, clean it daily. Empty water and replace with clean water. Cover your mouth and nose when you cough or sneeze. Wash your hands with soap and water often and for at least 20 seconds. This is also important after you cough or sneeze. If you cannot use soap and water, use alcohol-based hand sanitizer. Keep all follow-up visits. How is this prevented?  Get a flu shot every year. You may get the flu shot in late summer, fall, or winter. Ask your doctor when you should get your flu shot. Avoid contact with people who are sick during fall and winter. This is cold and flu season. Contact a doctor if: You get new symptoms. You have: Chest pain. Watery poop (diarrhea). A fever. Your cough gets worse. You start to have more mucus. You feel sick to your stomach. You throw up. Get help right away if you: Have shortness of breath. Have trouble breathing. Have skin or nails that turn a bluish color. Have very bad pain or stiffness in your neck. Get a sudden headache. Get sudden pain in your face or ear. Cannot eat or drink without throwing up. These symptoms may represent a serious problem that is an emergency. Get medical help right away. Call your local  emergency services (911 in the [place] Do not wait to see if the symptoms will go away. Do not drive yourself to the hospital. Summary Influenza is also called "the flu." It is an infection in the lungs, nose, and throat. It spreads easily from person to person. Take over-the-counter and prescription medicines only as told by your doctor. Getting a flu shot every year is the best way to not get the flu. This information is not intended to replace advice given to you by your health care provider. Make sure you discuss any questions you have with your health care provider. Document Revised: 02/25/2020 Document Reviewed: 02/25/2020 Elsevier Patient Education  2022 [employer] Oseltamivir Capsules What is this medication? OSELTAMIVIR (os el TAM i vir) prevents and treats infections caused by the flu virus (influenza). It works by slowing the spread of the flu virus in your body and reducing how long your symptoms last. It will not treat colds or infections caused by bacteria or other viruses. It will not replace the  annual flu vaccine. This medicine may be used for other purposes; ask your health care provider or pharmacist if you have questions. COMMON BRAND NAME(S): Tamiflu What should I tell my care team before I take this medication? They need to know if you have any of the following conditions: Difficulty swallowing Kidney disease An unusual or allergic reaction to oseltamivir, other medications, foods, dyes, or preservatives Pregnant or trying to get pregnant Breast-feeding How should I use this medication? Take this medication by mouth with water. Take it as directed on the prescription label at the same time every day. You can take it with or without food. If it upsets your stomach, take it with food. Take all of this medication unless your care team tells you to stop it early. Keep taking it even if you think you are better. When taking whole capsules: Do not cut, crush or chew this  medication. Swallow the capsules whole. When mixing capsule contents with liquid: Open the capsule and mix the contents in a small bowl with a small amount of a sweetened liquid such as chocolate syrup, corn syrup, caramel topping, or light brown sugar (dissolved in water). Stir the mixture and take the dose right away after mixing. Talk to your care team about the use of this medication in children. While it may be prescribed for selected conditions, precautions do apply. Overdosage: If you think you have taken too much of this medicine contact a poison control center or emergency room at once. NOTE: This medicine is only for you. Do not share this medicine with others. What if I miss a dose? If you miss a dose, take it as soon as you remember. If it is almost time for your next dose (within 2 hours), take only that dose. Do not take double or extra doses. What may interact with this medication? Intranasal influenza vaccine This list may not describe all possible interactions. Give your health care provider a list of all the medicines, herbs, non-prescription drugs, or dietary supplements you use. Also tell them if you smoke, drink alcohol, or use illegal drugs. Some items may interact with your medicine. What should I watch for while using this medication? Visit your care team for regular checks on your progress. Tell your care team if your symptoms do not start to get better or if they get worse. If you have the flu, you may be at an increased risk of developing seizures, confusion, or abnormal behavior. This occurs early in the illness, and more frequently in children and teens. These events are not common, but may result in accidental injury to the patient. Families and caregivers of patients should watch for signs of unusual behavior and contact a care team right away if the patient shows signs of unusual behavior. To treat the flu, start taking this medication within 2 days of getting flu  symptoms. This medication is not a substitute for the flu shot. Talk to your care team each year about an annual flu shot. What side effects may I notice from receiving this medication? Side effects that you should report to your care team as soon as possible: Allergic reactions--skin rash, itching, hives, swelling of the face, lips, tongue, or throat Confusion Hallucinations Redness, blistering, peeling, or loosening of the skin, including inside the mouth Seizures Tremors or shaking Trouble speaking Unusual changes in behavior Side effects that usually do not require medical attention (report to your care team if they continue or are bothersome): Headache Nausea Vomiting This  list may not describe all possible side effects. Call your doctor for medical advice about side effects. You may report side effects to FDA at 1-800-FDA-1088. Where should I keep my medication? Keep out of the reach of children and pets. Store at room temperature between 15 and 30 degrees C (59 and 86 degrees F). Throw away any unused medication after the expiration date. NOTE: This sheet is a summary. It may not cover all possible information. If you have questions about this medicine, talk to your doctor, pharmacist, or health care provider.  2022 Elsevier/Gold Standard (2021-03-27 00:00:00)

## 2023-09-04 NOTE — Progress Notes (Signed)
Assessment & Plan:  Influenza-like symptoms Assessment & Plan: Afebrile.  Nontoxic in appearance. influenza testing office negative.  Close exposure with husband at home and symptoms started yesterday evening.  Advised to start Tamiflu treatment dose.  Provided guaifenesin codeine for cough.  She will let me know how she is doing.  Orders: -     guaiFENesin-Codeine; Take 5 mLs by mouth at bedtime as needed for cough.  Dispense: 60 mL; Refill: 0 -     Oseltamivir Phosphate; Take 1 capsule (75 mg total) by mouth 2 (two) times daily.  Dispense: 10 capsule; Refill: 0  Flu-like symptoms -     POCT Influenza A/B     Return precautions given.   Risks, benefits, and alternatives of the medications and treatment plan prescribed today were discussed, and patient expressed understanding.   Education regarding symptom management and diagnosis given to patient on AVS either electronically or printed.  No follow-ups on file.  Rennie Plowman, FNP  Subjective:    Patient ID: Sandra Phillips, female    DOB: 07-12-1951, 73 y.o.   MRN: [id number]  CC: Sandra Phillips is a 73 y.o. female who presents today for an acute visit.    HPI: Husband tested positive for influenza yesterday  Last night she started to have cough and chills and slight HA.   She had been taking Tylenol  No fever.  She has been on codeine cough syrup in the past     No history of CKD.  Never smoker.   History of chronic pulmonary embolism, aortic atherosclerosis, osteoporosis, endometrial cancer Allergies: Penicillins, Shrimp extract, and Shrimp flavor agent (non-screening) Current Outpatient Medications on File Prior to Visit  Medication Sig Dispense Refill   acetaminophen (TYLENOL) 500 MG tablet Take by mouth.     alendronate (FOSAMAX) 70 MG tablet Take 1 tablet (70 mg total) by mouth every 7 (seven) days. Take with a full glass of water on an empty stomach. 4 tablet 11   cetirizine (ZYRTEC) 10 MG tablet Take 1 tablet  (10 mg total) by mouth daily. 30 tablet 0   cholecalciferol (VITAMIN D3) 25 MCG (1000 UNIT) tablet Take 1,000 Units by mouth daily. Take 5 times a week     ELIQUIS 5 MG TABS tablet TAKE ONE (1) TABLET BY MOUTH TWO TIMES PER DAY 60 tablet 6   metoprolol succinate (TOPROL-XL) 25 MG 24 hr tablet Take 1 tablet (25 mg total) by mouth daily. 90 tablet 1   rosuvastatin (CRESTOR) 10 MG tablet TAKE 1 TABLET BY MOUTH ONCE DAILY 90 tablet 1   telmisartan (MICARDIS) 40 MG tablet Take 1 tablet (40 mg total) by mouth at bedtime. 90 tablet 1   amoxicillin-clavulanate (AUGMENTIN) 875-125 MG tablet Take 1 tablet by mouth 2 (two) times daily. (Patient not taking: Reported on 03/20/2023) 14 tablet 0   ELIQUIS 5 MG TABS tablet TAKE 1 TABLET BY MOUTH TWICE DAILY 60 tablet 11   ergocalciferol (DRISDOL) 1.25 MG (50000 UT) capsule Take 1 capsule (50,000 Units total) by mouth once a week. 12 capsule 0   predniSONE (DELTASONE) 10 MG tablet 6 tablets on Day 1 , then reduce by 1 tablet daily until gone (Patient not taking: Reported on 03/20/2023) 21 tablet 0   No current facility-administered medications on file prior to visit.    Review of Systems  Constitutional:  Positive for chills. Negative for fever.  Respiratory:  Positive for cough. Negative for shortness of breath and wheezing.   Cardiovascular:  Negative for chest pain and palpitations.  Gastrointestinal:  Negative for nausea and vomiting.      Objective:    BP 120/78   Pulse 71   Temp 98.7 F (37.1 C) (Oral)   Ht 5\' 7"  (1.702 m)   Wt 235 lb (106.6 kg)   SpO2 98%   BMI 36.81 kg/m   BP Readings from Last 3 Encounters:  09/04/23 120/78  08/07/23 128/80  03/20/23 103/75   Wt Readings from Last 3 Encounters:  09/04/23 235 lb (106.6 kg)  08/07/23 233 lb 12.8 oz (106.1 kg)  03/20/23 225 lb (102.1 kg)    Physical Exam Vitals reviewed.  Constitutional:      Appearance: She is well-developed.  HENT:     Head: Normocephalic and atraumatic.      Right Ear: Hearing, tympanic membrane, ear canal and external ear normal. No decreased hearing noted. No drainage, swelling or tenderness. No middle ear effusion. No foreign body. Tympanic membrane is not erythematous or bulging.     Left Ear: Hearing, tympanic membrane, ear canal and external ear normal. No decreased hearing noted. No drainage, swelling or tenderness.  No middle ear effusion. No foreign body. Tympanic membrane is not erythematous or bulging.     Nose: Nose normal. No rhinorrhea.     Right Sinus: No maxillary sinus tenderness or frontal sinus tenderness.     Left Sinus: No maxillary sinus tenderness or frontal sinus tenderness.     Mouth/Throat:     Pharynx: Uvula midline. No oropharyngeal exudate or posterior oropharyngeal erythema.     Tonsils: No tonsillar abscesses.  Eyes:     Conjunctiva/sclera: Conjunctivae normal.  Cardiovascular:     Rate and Rhythm: Normal rate and regular rhythm.     Pulses: Normal pulses.     Heart sounds: Normal heart sounds.  Pulmonary:     Effort: Pulmonary effort is normal.     Breath sounds: Normal breath sounds. No wheezing, rhonchi or rales.  Lymphadenopathy:     Head:     Right side of head: No submental, submandibular, tonsillar, preauricular, posterior auricular or occipital adenopathy.     Left side of head: No submental, submandibular, tonsillar, preauricular, posterior auricular or occipital adenopathy.     Cervical: No cervical adenopathy.  Skin:    General: Skin is warm and dry.  Neurological:     Mental Status: She is alert.  Psychiatric:        Speech: Speech normal.        Behavior: Behavior normal.        Thought Content: Thought content normal.

## 2023-09-04 NOTE — Assessment & Plan Note (Addendum)
Afebrile.  Nontoxic in appearance. influenza testing office negative.  Close exposure with husband at home and symptoms started yesterday evening.  Advised to start Tamiflu treatment dose.  Provided guaifenesin codeine for cough.  She will let me know how she is doing.

## 2023-09-25 ENCOUNTER — Ambulatory Visit: Payer: Medicare Other | Admitting: Internal Medicine

## 2023-10-23 DIAGNOSIS — C541 Malignant neoplasm of endometrium: Secondary | ICD-10-CM | POA: Diagnosis not present

## 2023-10-24 DIAGNOSIS — S83232S Complex tear of medial meniscus, current injury, left knee, sequela: Secondary | ICD-10-CM | POA: Diagnosis not present

## 2023-10-24 DIAGNOSIS — M25561 Pain in right knee: Secondary | ICD-10-CM | POA: Diagnosis not present

## 2023-10-24 DIAGNOSIS — M25562 Pain in left knee: Secondary | ICD-10-CM | POA: Diagnosis not present

## 2023-10-24 DIAGNOSIS — M1712 Unilateral primary osteoarthritis, left knee: Secondary | ICD-10-CM | POA: Diagnosis not present

## 2023-11-06 ENCOUNTER — Other Ambulatory Visit: Payer: Self-pay | Admitting: Internal Medicine

## 2023-12-09 ENCOUNTER — Encounter (INDEPENDENT_AMBULATORY_CARE_PROVIDER_SITE_OTHER): Payer: Self-pay

## 2024-01-05 ENCOUNTER — Other Ambulatory Visit: Payer: Self-pay | Admitting: Internal Medicine

## 2024-01-17 ENCOUNTER — Other Ambulatory Visit: Payer: Self-pay | Admitting: Internal Medicine

## 2024-01-21 ENCOUNTER — Other Ambulatory Visit: Payer: Self-pay | Admitting: Internal Medicine

## 2024-02-02 ENCOUNTER — Ambulatory Visit: Payer: Medicare Other | Admitting: *Deleted

## 2024-02-02 VITALS — Ht 67.0 in | Wt 219.0 lb

## 2024-02-02 DIAGNOSIS — Z0000 Encounter for general adult medical examination without abnormal findings: Secondary | ICD-10-CM | POA: Diagnosis not present

## 2024-02-02 DIAGNOSIS — R6889 Other general symptoms and signs: Secondary | ICD-10-CM

## 2024-02-02 NOTE — Patient Instructions (Signed)
 Sandra Phillips , Thank you for taking time out of your busy schedule to complete your Annual Wellness Visit with me. I enjoyed our conversation and look forward to speaking with you again next year. I, as well as your care team,  appreciate your ongoing commitment to your health goals. Please review the following plan we discussed and let me know if I can assist you in the future. Your Game plan/ To Do List    Referrals: If you haven't heard from the office you've been referred to, please reach out to them at the phone provided.  Remember to update your shingles vaccines at your pharmacy. Call and schedule your mammogram that was ordered 08/07/23. Follow up Visits: Next Medicare AWV with our clinical staff: 02/04/25 @ 8:10   Have you seen your provider in the last 6 months (3 months if uncontrolled diabetes)? Yes Next Office Visit with your provider: 02/04/24  Clinician Recommendations:  Aim for 30 minutes of exercise or brisk walking, 6-8 glasses of water, and 5 servings of fruits and vegetables each day.       This is a list of the screening recommended for you and due dates:  Health Maintenance  Topic Date Due   Yearly kidney health urinalysis for diabetes  Never done   Zoster (Shingles) Vaccine (1 of 2) Never done   COVID-19 Vaccine (5 - 2024-25 season) 03/23/2023   Mammogram  04/04/2023   Flu Shot  02/20/2024   Yearly kidney function blood test for diabetes  08/06/2024   Medicare Annual Wellness Visit  02/01/2025   Colon Cancer Screening  03/19/2026   DTaP/Tdap/Td vaccine (2 - Td or Tdap) 01/30/2032   Pneumococcal Vaccine for age over 54  Completed   DEXA scan (bone density measurement)  Completed   Hepatitis C Screening  Completed   Hepatitis B Vaccine  Aged Out   HPV Vaccine  Aged Out   Meningitis B Vaccine  Aged Out    Advanced directives: (Copy Requested) Please bring a copy of your health care power of attorney and living will to the office to be added to your chart at your  convenience. You can mail to [facility] [address] [place], [place] [address] or email to [email or web address] Advance Care Planning is important because it:  [x]  Makes sure you receive the medical care that is consistent with your values, goals, and preferences  [x]  It provides guidance to your family and loved ones and reduces their decisional burden about whether or not they are making the right decisions based on your wishes.

## 2024-02-02 NOTE — Progress Notes (Signed)
 Subjective:   Sandra Phillips is a 73 y.o. who presents for a Medicare Wellness preventive visit.  As a reminder, Annual Wellness Visits don't include a physical exam, and some assessments may be limited, especially if this visit is performed virtually. We may recommend an in-person follow-up visit with your provider if needed.  Visit Complete: Virtual I connected with  [name] on 02/02/24 by a audio enabled telemedicine application and verified that I am speaking with the correct person using two identifiers.  Patient Location: Home  Provider Location: Home Office  I discussed the limitations of evaluation and management by telemedicine. The patient expressed understanding and agreed to proceed.  Vital Signs: Because this visit was a virtual/telehealth visit, some criteria may be missing or patient reported. Any vitals not documented were not able to be obtained and vitals that have been documented are patient reported.  VideoDeclined- This patient declined Librarian, academic. Therefore the visit was completed with audio only.  Persons Participating in Visit: Patient.  AWV Questionnaire: Yes: Patient Medicare AWV questionnaire was completed by the patient on 01/29/24; I have confirmed that all information answered by patient is correct and no changes since this date.  Cardiac Risk Factors include: advanced age (>36men, >63 women);hypertension;obesity (BMI >30kg/m2)     Objective:    Today's Vitals   02/02/24 0810  Weight: 219 lb (99.3 kg)  Height: 5' 7 (1.702 m)   Body mass index is 34.3 kg/m.     02/02/2024    8:23 AM 03/20/2023    7:05 AM 01/28/2023    2:54 PM 08/15/2022    9:31 AM 06/26/2022    8:59 AM 06/17/2022    7:44 AM 01/16/2022    2:44 PM  Advanced Directives  Does Patient Have a Medical Advance Directive? Yes Yes Yes No Yes Yes Yes  Type of Estate agent of [place];Living will Healthcare Power of [place];Living  will Healthcare Power of [place];Living will   Healthcare Power of [place];Living will Healthcare Power of [place];Living will  Does patient want to make changes to medical advance directive?     No - Patient declined No - Patient declined No - Patient declined  Copy of Healthcare Power of Attorney in Chart? No - copy requested  No - copy requested   No - copy requested No - copy requested  Would patient like information on creating a medical advance directive?    No - Patient declined  No - Patient declined     Current Medications (verified) Outpatient Encounter Medications as of 02/02/2024  Medication Sig   acetaminophen  (TYLENOL ) 500 MG tablet Take by mouth.   cholecalciferol (VITAMIN D3) 25 MCG (1000 UNIT) tablet Take 1,000 Units by mouth daily. Take 5 times a week   ELIQUIS  5 MG TABS tablet TAKE ONE (1) TABLET BY MOUTH TWO TIMES PER DAY   ELIQUIS  5 MG TABS tablet TAKE 1 TABLET BY MOUTH TWICE DAILY   metoprolol  succinate (TOPROL -XL) 25 MG 24 hr tablet TAKE 1 TABLET BY MOUTH DAILY.   rosuvastatin  (CRESTOR ) 10 MG tablet TAKE 1 TABLET BY MOUTH ONCE DAILY   telmisartan  (MICARDIS ) 40 MG tablet TAKE 1 TABLET BY MOUTH AT BEDTIME.   alendronate  (FOSAMAX ) 70 MG tablet Take 1 tablet (70 mg total) by mouth every 7 (seven) days. Take with a full glass of water on an empty stomach. (Patient not taking: Reported on 02/02/2024)   amoxicillin -clavulanate (AUGMENTIN ) 875-125 MG tablet Take 1 tablet by mouth  2 (two) times daily. (Patient not taking: Reported on 02/02/2024)   ergocalciferol  (DRISDOL ) 1.25 MG (50000 UT) capsule Take 1 capsule (50,000 Units total) by mouth once a week. (Patient not taking: Reported on 02/02/2024)   oseltamivir  (TAMIFLU ) 75 MG capsule Take 1 capsule (75 mg total) by mouth 2 (two) times daily.   [DISCONTINUED] cetirizine  (ZYRTEC ) 10 MG tablet Take 1 tablet (10 mg total) by mouth daily.   [DISCONTINUED] guaiFENesin -codeine  100-10 MG/5ML syrup Take 5 mLs by mouth at bedtime as needed  for cough. (Patient not taking: Reported on 02/02/2024)   [DISCONTINUED] predniSONE  (DELTASONE ) 10 MG tablet 6 tablets on Day 1 , then reduce by 1 tablet daily until gone (Patient not taking: Reported on 03/20/2023)   No facility-administered encounter medications on file as of 02/02/2024.    Allergies (verified) Penicillins, Shrimp extract, and Shrimp flavor agent (non-screening)   History: Past Medical History:  Diagnosis Date   Allergy    shrimp   Cancer (HCC)    endometrial   COVID-19    12/13/21   Current long-term use of anticoagulant medication with history of deep venous thrombosis (DVT)    History of DVT (deep vein thrombosis)    History of radiation therapy    Vagina (HDR) 06/26/22-07/10/22- Dr. Lynwood Nasuti   Hypertension    Primary osteoarthritis    Past Surgical History:  Procedure Laterality Date   ABDOMINAL HYSTERECTOMY     COLONOSCOPY WITH PROPOFOL  N/A 01/11/2022   Procedure: COLONOSCOPY WITH PROPOFOL ;  Surgeon: Therisa Bi, MD;  Location: [facility] ENDOSCOPY;  Service: Gastroenterology;  Laterality: N/A;   COLONOSCOPY WITH PROPOFOL  N/A 03/20/2023   Procedure: COLONOSCOPY WITH PROPOFOL ;  Surgeon: Therisa Bi, MD;  Location: [facility] ENDOSCOPY;  Service: Gastroenterology;  Laterality: N/A;   KNEE ARTHROSCOPY Left 06/12/2021   Procedure: LEFT KNEE ARTHROSCOPY WITH DEBRIDEMENT AND REPAIR OF A POSTERIOR MEDIAL ROOT TEAR AND ABRASION CONDOPLASTY;  Surgeon: Edie Norleen PARAS, MD;  Location: [facility] ORS;  Service: Orthopedics;  Laterality: Left;   PULMONARY THROMBECTOMY N/A 07/09/2021   Procedure: PULMONARY THROMBECTOMY;  Surgeon: Marea Selinda RAMAN, MD;  Location: [facility] INVASIVE CV LAB;  Service: Cardiovascular;  Laterality: N/A;   ROBOTIC ASSISTED TOTAL HYSTERECTOMY  03/2022   Family History  Problem Relation Age of Onset   Breast cancer Mother 31       pt also has bone and bladder   Cancer Mother    Rheum arthritis Sister    Social History   Socioeconomic History   Marital status:  Married    Spouse name: Not on file   Number of children: Not on file   Years of education: Not on file   Highest education level: Bachelor's degree (e.g., BA, AB, BS)  Occupational History   Not on file  Tobacco Use   Smoking status: Never   Smokeless tobacco: Never  Vaping Use   Vaping status: Never Used  Substance and Sexual Activity   Alcohol use: Yes    Alcohol/week: 1.0 standard drink of alcohol    Types: 1 Glasses of wine per week    Comment: a glass at dinner   Drug use: Never   Sexual activity: Not Currently  Other Topics Concern   Not on file  Social History Narrative   Married   Social Drivers of Health   Financial Resource Strain: Low Risk  (01/29/2024)   Overall Financial Resource Strain (CARDIA)    Difficulty of Paying Living Expenses: Not hard at all  Food Insecurity: No Food Insecurity (  01/29/2024)   Hunger Vital Sign    Worried About Running Out of Food in the Last Year: Never true    Ran Out of Food in the Last Year: Never true  Transportation Needs: No Transportation Needs (01/29/2024)   PRAPARE - Administrator, Civil Service (Medical): No    Lack of Transportation (Non-Medical): No  Physical Activity: Insufficiently Active (01/29/2024)   Exercise Vital Sign    Days of Exercise per Week: 3 days    Minutes of Exercise per Session: 30 min  Stress: No Stress Concern Present (01/29/2024)   [employer] of Occupational Health - Occupational Stress Questionnaire    Feeling of Stress: Not at all  Social Connections: Socially Integrated (01/29/2024)   Social Connection and Isolation Panel    Frequency of Communication with Friends and Family: More than three times a week    Frequency of Social Gatherings with Friends and Family: More than three times a week    Attends Religious Services: More than 4 times per year    Active Member of [employer] or Organizations: Yes    Attends Engineer, structural: More than 4 times per year    Marital  Status: Married    Tobacco Counseling Counseling given: Not Answered    Clinical Intake:  Pre-visit preparation completed: Yes  Pain : No/denies pain     BMI - recorded: 34.3 Nutritional Status: BMI > 30  Obese Nutritional Risks: None Diabetes: No  Lab Results  Component Value Date   HGBA1C 5.7 08/07/2023   HGBA1C 5.6 02/03/2023   HGBA1C 5.5 12/05/2021     How often do you need to have someone help you when you read instructions, pamphlets, or other written materials from your doctor or pharmacy?: 1 - Never  Interpreter Needed?: No  Information entered by :: R. Nechuma Boven LPN   Activities of Daily Living     01/29/2024   12:09 PM  In your present state of health, do you have any difficulty performing the following activities:  Hearing? 0  Vision? 0  Difficulty concentrating or making decisions? 0  Walking or climbing stairs? 0  Dressing or bathing? 0  Doing errands, shopping? 0  Preparing Food and eating ? N  Using the Toilet? N  In the past six months, have you accidently leaked urine? N  Do you have problems with loss of bowel control? N  Managing your Medications? N  Managing your Finances? N  Housekeeping or managing your Housekeeping? N    Patient Care Team: Marylynn Verneita CROME, MD as PCP - General (Internal Medicine) Therisa Bi, MD as Consulting Physician (Gastroenterology) Arlee Almarie SAILOR, MD as Referring Physician (Obstetrics and Gynecology)  I have updated your Care Teams any recent Medical Services you may have received from other providers in the past year.     Assessment:   This is a routine wellness examination for Karris.  Hearing/Vision screen Hearing Screening - Comments:: No issues Vision Screening - Comments:: glasses   Goals Addressed             This Visit's Progress    Patient Stated       Wants to continue to loss weight       Depression Screen     02/02/2024    8:19 AM 08/07/2023    8:43 AM 02/03/2023    8:00 AM  01/28/2023    2:49 PM 09/03/2022    2:58 PM 08/01/2022    8:07 AM 04/29/2022  8:13 AM  PHQ 2/9 Scores  PHQ - 2 Score 0 0 0 0 0 0 0  PHQ- 9 Score 0 0 0 0       Fall Risk     01/29/2024   12:09 PM 08/07/2023    8:43 AM 02/03/2023    8:00 AM 01/28/2023    2:55 PM 01/24/2023    1:24 PM  Fall Risk   Falls in the past year? 0 0 0 0 0  Number falls in past yr: 0 0 0 0 0  Injury with Fall? 0 0 0 0 0  Risk for fall due to : No Fall Risks  History of fall(s) No Fall Risks   Follow up Falls evaluation completed;Falls prevention discussed Falls evaluation completed Falls evaluation completed Falls prevention discussed;Falls evaluation completed     MEDICARE RISK AT HOME:  Medicare Risk at Home Any stairs in or around the home?: (Patient-Rptd) No If so, are there any without handrails?: No Home free of loose throw rugs in walkways, pet beds, electrical cords, etc?: (Patient-Rptd) Yes Adequate lighting in your home to reduce risk of falls?: (Patient-Rptd) Yes Life alert?: (Patient-Rptd) No Use of a cane, walker or w/c?: (Patient-Rptd) No Grab bars in the bathroom?: (Patient-Rptd) Yes Shower chair or bench in shower?: (Patient-Rptd) No Elevated toilet seat or a handicapped toilet?: (Patient-Rptd) Yes  TIMED UP AND GO:  Was the test performed?  No  Cognitive Function: 6CIT completed        02/02/2024    8:23 AM 01/28/2023    2:56 PM  6CIT Screen  What Year? 0 points 0 points  What month? 0 points 0 points  What time? 0 points 0 points  Count back from 20 0 points 0 points  Months in reverse 0 points 0 points  Repeat phrase 0 points 2 points  Total Score 0 points 2 points    Immunizations Immunization History  Administered Date(s) Administered   Fluad Quad(high Dose 65+) 04/10/2022   Fluad Trivalent(High Dose 65+) 08/07/2023   Moderna Sars-Covid-2 Vaccination 09/02/2019, 10/01/2019, 05/23/2020   PFIZER(Purple Top)SARS-COV-2 Vaccination 01/25/2021   PNEUMOCOCCAL CONJUGATE-20  04/29/2022   Tdap 01/29/2022    Screening Tests Health Maintenance  Topic Date Due   Diabetic kidney evaluation - Urine ACR  Never done   Zoster Vaccines- Shingrix (1 of 2) Never done   COVID-19 Vaccine (5 - 2024-25 season) 03/23/2023   MAMMOGRAM  04/04/2023   INFLUENZA VACCINE  02/20/2024   Diabetic kidney evaluation - eGFR measurement  08/06/2024   Medicare Annual Wellness (AWV)  02/01/2025   Colonoscopy  03/19/2026   DTaP/Tdap/Td (2 - Td or Tdap) 01/30/2032   Pneumococcal Vaccine: 50+ Years  Completed   DEXA SCAN  Completed   Hepatitis C Screening  Completed   Hepatitis B Vaccines  Aged Out   HPV VACCINES  Aged Out   Meningococcal B Vaccine  Aged Out    Health Maintenance  Health Maintenance Due  Topic Date Due   Diabetic kidney evaluation - Urine ACR  Never done   Zoster Vaccines- Shingrix (1 of 2) Never done   COVID-19 Vaccine (5 - 2024-25 season) 03/23/2023   MAMMOGRAM  04/04/2023   Health Maintenance Items Addressed: Discussed the need to update shingles vaccines. Patient declines covid vaccine. Patient reminded to call and schedule her mammogram that was ordered 08/07/23.  Additional Screening:  Vision Screening: Recommended annual ophthalmology exams for early detection of glaucoma and other disorders of the eye. Up to  date [employer]	[place] Would you like a referral to an eye doctor? No    Dental Screening: Recommended annual dental exams for proper oral hygiene  Community Resource Referral / Chronic Care Management: CRR required this visit?  No   CCM required this visit?  No   Plan:    I have personally reviewed and noted the following in the patient's chart:   Medical and social history Use of alcohol, tobacco or illicit drugs  Current medications and supplements including opioid prescriptions. Patient is not currently taking opioid prescriptions. Functional ability and status Nutritional status Physical activity Advanced directives List of other  physicians Hospitalizations, surgeries, and ER visits in previous 12 months Vitals Screenings to include cognitive, depression, and falls Referrals and appointments  In addition, I have reviewed and discussed with patient certain preventive protocols, quality metrics, and best practice recommendations. A written personalized care plan for preventive services as well as general preventive health recommendations were provided to patient.   Angeline Fredericks, LPN   2/85/7974   After Visit Summary: (MyChart) Due to this being a telephonic visit, the after visit summary with patients personalized plan was offered to patient via MyChart   Notes: Nothing significant to report at this time.

## 2024-02-04 ENCOUNTER — Ambulatory Visit: Payer: Medicare Other | Admitting: Internal Medicine

## 2024-02-04 ENCOUNTER — Encounter: Payer: Self-pay | Admitting: Internal Medicine

## 2024-02-04 VITALS — BP 124/84 | HR 70 | Ht 67.0 in | Wt 219.4 lb

## 2024-02-04 DIAGNOSIS — E669 Obesity, unspecified: Secondary | ICD-10-CM

## 2024-02-04 DIAGNOSIS — I2782 Chronic pulmonary embolism: Secondary | ICD-10-CM

## 2024-02-04 DIAGNOSIS — E559 Vitamin D deficiency, unspecified: Secondary | ICD-10-CM | POA: Diagnosis not present

## 2024-02-04 DIAGNOSIS — R7301 Impaired fasting glucose: Secondary | ICD-10-CM

## 2024-02-04 DIAGNOSIS — M816 Localized osteoporosis [Lequesne]: Secondary | ICD-10-CM

## 2024-02-04 DIAGNOSIS — C541 Malignant neoplasm of endometrium: Secondary | ICD-10-CM

## 2024-02-04 DIAGNOSIS — E1169 Type 2 diabetes mellitus with other specified complication: Secondary | ICD-10-CM | POA: Diagnosis not present

## 2024-02-04 DIAGNOSIS — I700 Atherosclerosis of aorta: Secondary | ICD-10-CM

## 2024-02-04 DIAGNOSIS — I10 Essential (primary) hypertension: Secondary | ICD-10-CM | POA: Diagnosis not present

## 2024-02-04 DIAGNOSIS — E785 Hyperlipidemia, unspecified: Secondary | ICD-10-CM

## 2024-02-04 DIAGNOSIS — Z7901 Long term (current) use of anticoagulants: Secondary | ICD-10-CM

## 2024-02-04 DIAGNOSIS — Z6834 Body mass index (BMI) 34.0-34.9, adult: Secondary | ICD-10-CM

## 2024-02-04 LAB — COMPREHENSIVE METABOLIC PANEL WITH GFR
ALT: 21 U/L (ref 0–35)
AST: 20 U/L (ref 0–37)
Albumin: 4.4 g/dL (ref 3.5–5.2)
Alkaline Phosphatase: 85 U/L (ref 39–117)
BUN: 13 mg/dL (ref 6–23)
CO2: 28 meq/L (ref 19–32)
Calcium: 9.5 mg/dL (ref 8.4–10.5)
Chloride: 104 meq/L (ref 96–112)
Creatinine, Ser: 0.77 mg/dL (ref 0.40–1.20)
GFR: 78.01 mL/min (ref >60.00)
Glucose, Bld: 99 mg/dL (ref 70–99)
Potassium: 3.8 meq/L (ref 3.5–5.1)
Sodium: 139 meq/L (ref 135–145)
Total Bilirubin: 0.7 mg/dL (ref 0.2–1.2)
Total Protein: 7.5 g/dL (ref 6.0–8.3)

## 2024-02-04 LAB — MICROALBUMIN / CREATININE URINE RATIO
Creatinine,U: 61.3 mg/dL
Microalb Creat Ratio: 16.6 mg/g (ref 0.0–30.0)
Microalb, Ur: 1 mg/dL (ref 0.0–1.9)

## 2024-02-04 LAB — VITAMIN D 25 HYDROXY (VIT D DEFICIENCY, FRACTURES): VITD: 25.52 ng/mL — ABNORMAL LOW (ref 30.00–100.00)

## 2024-02-04 MED ORDER — ALENDRONATE SODIUM 70 MG PO TABS: 70.0000 mg | ORAL_TABLET | ORAL | 11 refills | Status: DC

## 2024-02-04 MED ORDER — METOPROLOL SUCCINATE ER 25 MG PO TB24: 25.0000 mg | ORAL_TABLET | Freq: Every day | ORAL | 1 refills | Status: DC

## 2024-02-04 NOTE — Assessment & Plan Note (Signed)
 Lifelong anticoagulation recommended as long as she remains ambulatory without falls.  Currently using Eliqius  Reminded that she will need suspension prior to oral surgery and other procedures

## 2024-02-04 NOTE — Progress Notes (Signed)
 Subjective:  Patient ID: Sandra Phillips, female    DOB: 06/23/1951  Age: 73 y.o. MRN: [id number]  CC: The primary encounter diagnosis was Essential hypertension. Diagnoses of Hyperlipidemia associated with type 2 diabetes mellitus (HCC), Impaired fasting glucose, Endometrial cancer (HCC), Chronic pulmonary embolism without acute cor pulmonale, unspecified pulmonary embolism type (HCC), Vitamin D  deficiency, Localized osteoporosis without current pathological fracture, Long term current use of anticoagulant, Obesity (BMI 35.0-39.9 without comorbidity), and Aortic atherosclerosis (HCC) were also pertinent to this visit.   HPI Sandra Phillips presents for  Chief Complaint  Patient presents with   Medical Management of Chronic Issues    6 month follow up    Osteoporosis: diagnosed by DEXA in 2023 Sept.  Started alendronate   but stopped after several months due to oral surgery that resulted in abscess.  has been taking vitamin D  2000 Iu's (but only taking it  5 days per week for unclear reasons ( and supplementing calcium  in diet.  Hypertension: patient checks blood pressure twice weekly at home.  Readings have been for the most part <130/80 at rest . Patient is following a reduced salt diet most days and is taking telmisartan  and metoprolol  ns as prescribed   Obesity:  she has had an intentional weight loss through dietary restriction nd exercise    Outpatient Medications Prior to Visit  Medication Sig Dispense Refill   acetaminophen  (TYLENOL ) 500 MG tablet Take by mouth.     cholecalciferol (VITAMIN D3) 25 MCG (1000 UNIT) tablet Take 1,000 Units by mouth daily. Take 5 times a week     ELIQUIS  5 MG TABS tablet TAKE 1 TABLET BY MOUTH TWICE DAILY 60 tablet 11   rosuvastatin  (CRESTOR ) 10 MG tablet TAKE 1 TABLET BY MOUTH ONCE DAILY 90 tablet 1   telmisartan  (MICARDIS ) 40 MG tablet TAKE 1 TABLET BY MOUTH AT BEDTIME. 90 tablet 1   metoprolol  succinate (TOPROL -XL) 25 MG 24 hr tablet TAKE 1 TABLET BY  MOUTH DAILY. 30 tablet 0   alendronate  (FOSAMAX ) 70 MG tablet Take 1 tablet (70 mg total) by mouth every 7 (seven) days. Take with a full glass of water on an empty stomach. (Patient not taking: Reported on 02/04/2024) 4 tablet 11   amoxicillin -clavulanate (AUGMENTIN ) 875-125 MG tablet Take 1 tablet by mouth 2 (two) times daily. (Patient not taking: Reported on 02/02/2024) 14 tablet 0   ELIQUIS  5 MG TABS tablet TAKE ONE (1) TABLET BY MOUTH TWO TIMES PER DAY 60 tablet 6   ergocalciferol  (DRISDOL ) 1.25 MG (50000 UT) capsule Take 1 capsule (50,000 Units total) by mouth once a week. (Patient not taking: Reported on 02/02/2024) 12 capsule 0   oseltamivir  (TAMIFLU ) 75 MG capsule Take 1 capsule (75 mg total) by mouth 2 (two) times daily. 10 capsule 0   No facility-administered medications prior to visit.    Review of Systems;  Patient denies headache, fevers, malaise, unintentional weight loss, skin rash, eye pain, sinus congestion and sinus pain, sore throat, dysphagia,  hemoptysis , cough, dyspnea, wheezing, chest pain, palpitations, orthopnea, edema, abdominal pain, nausea, melena, diarrhea, constipation, flank pain, dysuria, hematuria, urinary  Frequency, nocturia, numbness, tingling, seizures,  Focal weakness, Loss of consciousness,  Tremor, insomnia, depression, anxiety, and suicidal ideation.      Objective:  BP 124/84   Pulse 70   Ht 5' 7 (1.702 m)   Wt 219 lb 6.4 oz (99.5 kg)   SpO2 95%   BMI 34.36 kg/m   BP Readings from Last  3 Encounters:  02/04/24 124/84  09/04/23 120/78  08/07/23 128/80    Wt Readings from Last 3 Encounters:  02/04/24 219 lb 6.4 oz (99.5 kg)  02/02/24 219 lb (99.3 kg)  09/04/23 235 lb (106.6 kg)    Physical Exam Vitals reviewed.  Constitutional:      General: She is not in acute distress.    Appearance: Normal appearance. She is normal weight. She is not ill-appearing, toxic-appearing or diaphoretic.  HENT:     Head: Normocephalic.  Eyes:     General:  No scleral icterus.       Right eye: No discharge.        Left eye: No discharge.     Conjunctiva/sclera: Conjunctivae normal.  Cardiovascular:     Rate and Rhythm: Normal rate and regular rhythm.     Heart sounds: Normal heart sounds.  Pulmonary:     Effort: Pulmonary effort is normal. No respiratory distress.     Breath sounds: Normal breath sounds.  Musculoskeletal:        General: Normal range of motion.  Skin:    General: Skin is warm and dry.  Neurological:     General: No focal deficit present.     Mental Status: She is alert and oriented to person, place, and time. Mental status is at baseline.  Psychiatric:        Mood and Affect: Mood normal.        Behavior: Behavior normal.        Thought Content: Thought content normal.        Judgment: Judgment normal.     Lab Results  Component Value Date   HGBA1C 5.7 08/07/2023   HGBA1C 5.6 02/03/2023   HGBA1C 5.5 12/05/2021    Lab Results  Component Value Date   CREATININE 0.77 02/04/2024   CREATININE 0.89 08/07/2023   CREATININE 0.85 02/03/2023    Lab Results  Component Value Date   WBC 5.7 08/07/2023   HGB 14.8 08/07/2023   HCT 43.5 08/07/2023   PLT 212.0 08/07/2023   GLUCOSE 99 02/04/2024   CHOL 161 08/07/2023   TRIG 120.0 08/07/2023   HDL 60.00 08/07/2023   LDLDIRECT 81.0 08/07/2023   LDLCALC 77 08/07/2023   ALT 21 02/04/2024   AST 20 02/04/2024   NA 139 02/04/2024   K 3.8 02/04/2024   CL 104 02/04/2024   CREATININE 0.77 02/04/2024   BUN 13 02/04/2024   CO2 28 02/04/2024   TSH 3.45 08/07/2023   INR 1.1 07/07/2021   HGBA1C 5.7 08/07/2023   MICROALBUR 1.0 02/04/2024    No results found.  Assessment & Plan:  .Essential hypertension Assessment & Plan: Well controlled on current regimen. Renal function stable, no changes today.   Orders: -     Microalbumin / creatinine urine ratio -     Comprehensive metabolic panel with GFR  Hyperlipidemia associated with type 2 diabetes mellitus (HCC) -      Lipid Panel w/reflex Direct LDL; Future -     Comprehensive metabolic panel with GFR; Future  Impaired fasting glucose  Endometrial cancer [facility]) Assessment & Plan: She has completed   vaginal cuff brachytherapy by radiation oncology to reduce risk of recurrence and had no adverse effects. 3 month follow up with Dr Arlee was normal in Dec 2023.    Chronic pulmonary embolism without acute cor pulmonale, unspecified pulmonary embolism type [facility]) Assessment & Plan: Lifelong anticoagulation recommended as long as she remains ambulatory without falls.  Currently using  Eliqius  Reminded that she will need suspension prior to oral surgery and other procedures   Vitamin D  deficiency Assessment & Plan: Taking 10,000 IUs weekly , 600 mg calcium  tablet daily. .  Repeat level needed  Orders: -     VITAMIN D  25 Hydroxy (Vit-D Deficiency, Fractures) -     VITAMIN D  25 Hydroxy (Vit-D Deficiency, Fractures); Future  Localized osteoporosis without current pathological fracture Assessment & Plan: She is ready to resume  weekly alendronate  .  Vitamin d  supplementation  continue   Orders: -     DG Bone Density; Future  Long term current use of anticoagulant -     CBC with Differential/Platelet; Future  Obesity (BMI 35.0-39.9 without comorbidity) Assessment & Plan: I have congratulated her in reduction of   BMI and encouraged  Continued weight loss with goal of 10% of body weigh over the next 6 months using a low glycemic index diet and regular exercise a minimum of 5 days per week.     Aortic atherosclerosis (HCC) Assessment & Plan: Findings from CT reviewed.  she is tolerating rosuvastatin .    Other orders -     Metoprolol  Succinate ER; Take 1 tablet (25 mg total) by mouth daily.  Dispense: 90 tablet; Refill: 1 -     Alendronate  Sodium; Take 1 tablet (70 mg total) by mouth every 7 (seven) days. Take with a full glass of water on an empty stomach.  Dispense: 4 tablet; Refill: 11     I  spent 34 minutes on the day of this face to face encounter reviewing patient's  most recent visit with oncology,  relevant surgical and non surgical procedures, recent  labs and imaging studies, counseling on weight management,  reviewing the assessment and plan with patient, and post visit ordering and reviewing of  diagnostics and therapeutics with patient  .   Follow-up: Return in about 6 months (around 08/06/2024) for physical.   Verneita LITTIE Kettering, MD

## 2024-02-04 NOTE — Assessment & Plan Note (Signed)
 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 3 month follow up with Dr Clifton James was normal in Dec 2023.

## 2024-02-04 NOTE — Assessment & Plan Note (Addendum)
 Taking 10,000 IUs weekly , 600 mg calcium  tablet daily. .  Repeat level needed

## 2024-02-04 NOTE — Assessment & Plan Note (Signed)
Well controlled on current regimen. Renal function stable, no changes today. 

## 2024-02-04 NOTE — Assessment & Plan Note (Signed)
 She is ready to resume  weekly alendronate  .  Vitamin d  supplementation  continue

## 2024-02-04 NOTE — Assessment & Plan Note (Signed)
Findings from CT reviewed.  she is tolerating rosuvastatin.

## 2024-02-04 NOTE — Patient Instructions (Addendum)
  The new goals for optimal blood pressure management are 120/70. To 130/80  Please check your blood pressure a few times at home and send me the readings so I can determine if you need a change in medication     Your  DEXA scan  been ordered.  You are encouraged (required) to call to schedule your own  appointment at [facility]  , and their phone number is [phone]   RESTARTING ALENDRONATE  .  RX SENT   CONGRATULATIONS ON YOUR EXCELLENT WEIGHT LOSS!!!!!

## 2024-02-04 NOTE — Assessment & Plan Note (Signed)
 I have congratulated her in reduction of   BMI and encouraged  Continued weight loss with goal of 10% of body weigh over the next 6 months using a low glycemic index diet and regular exercise a minimum of 5 days per week.

## 2024-02-05 ENCOUNTER — Ambulatory Visit: Payer: Self-pay | Admitting: Internal Medicine

## 2024-02-05 DIAGNOSIS — C541 Malignant neoplasm of endometrium: Secondary | ICD-10-CM | POA: Diagnosis not present

## 2024-02-05 MED ORDER — ERGOCALCIFEROL 1.25 MG (50000 UT) PO CAPS: 50000.0000 [IU] | ORAL_CAPSULE | ORAL | 0 refills | Status: DC

## 2024-02-05 NOTE — Addendum Note (Signed)
 Addended by: MARYLYNN VERNEITA CROME on: 02/05/2024 07:58 AM   Modules accepted: Orders

## 2024-02-13 DIAGNOSIS — C541 Malignant neoplasm of endometrium: Secondary | ICD-10-CM | POA: Diagnosis not present

## 2024-02-13 DIAGNOSIS — K449 Diaphragmatic hernia without obstruction or gangrene: Secondary | ICD-10-CM | POA: Diagnosis not present

## 2024-02-13 DIAGNOSIS — K760 Fatty (change of) liver, not elsewhere classified: Secondary | ICD-10-CM | POA: Diagnosis not present

## 2024-02-13 DIAGNOSIS — Z9049 Acquired absence of other specified parts of digestive tract: Secondary | ICD-10-CM | POA: Diagnosis not present

## 2024-02-24 ENCOUNTER — Other Ambulatory Visit: Payer: Self-pay | Admitting: Internal Medicine

## 2024-03-23 ENCOUNTER — Other Ambulatory Visit: Payer: Self-pay | Admitting: Internal Medicine

## 2024-03-25 NOTE — Telephone Encounter (Signed)
 Refilled: 02/24/2024 Last OV: 02/04/2024 Next OV: 08/11/2024  Per last result note message from 02/04/2024. Pt was to take the vitamin d  for one month only and then resume taking OTC vitamin d .

## 2024-03-31 ENCOUNTER — Encounter: Payer: Self-pay | Admitting: Internal Medicine

## 2024-04-05 ENCOUNTER — Ambulatory Visit
Admission: RE | Admit: 2024-04-05 | Discharge: 2024-04-05 | Disposition: A | Discharge status: Home / Self Care | Source: Ambulatory Visit | Attending: Internal Medicine | Admitting: Internal Medicine

## 2024-04-05 DIAGNOSIS — Z1231 Encounter for screening mammogram for malignant neoplasm of breast: Secondary | ICD-10-CM | POA: Insufficient documentation

## 2024-04-21 ENCOUNTER — Other Ambulatory Visit: Payer: Self-pay | Admitting: Internal Medicine

## 2024-05-06 NOTE — Progress Notes (Signed)
 Sandra Phillips                                          MRN: [id number]   05/06/2024   The [facility]BCI Quality Team Specialist reviewed this patient medical record for the purposes of chart review for care gap closure. The following were reviewed: abstraction for care gap closure-kidney health evaluation for diabetes:eGFR  and uACR.    VBCI Quality Team

## 2024-05-18 DIAGNOSIS — K080 Exfoliation of teeth due to systemic causes: Secondary | ICD-10-CM | POA: Diagnosis not present

## 2024-05-19 ENCOUNTER — Encounter: Payer: Self-pay | Admitting: Internal Medicine

## 2024-06-24 DIAGNOSIS — C541 Malignant neoplasm of endometrium: Secondary | ICD-10-CM | POA: Diagnosis not present

## 2024-06-24 DIAGNOSIS — N898 Other specified noninflammatory disorders of vagina: Secondary | ICD-10-CM | POA: Diagnosis not present

## 2024-06-25 DIAGNOSIS — N898 Other specified noninflammatory disorders of vagina: Secondary | ICD-10-CM | POA: Diagnosis not present

## 2024-06-25 DIAGNOSIS — C541 Malignant neoplasm of endometrium: Secondary | ICD-10-CM | POA: Diagnosis not present

## 2024-07-20 ENCOUNTER — Ambulatory Visit (INDEPENDENT_AMBULATORY_CARE_PROVIDER_SITE_OTHER): Admitting: Vascular Surgery

## 2024-07-20 ENCOUNTER — Encounter (INDEPENDENT_AMBULATORY_CARE_PROVIDER_SITE_OTHER): Payer: Self-pay | Admitting: Vascular Surgery

## 2024-07-20 VITALS — BP 128/72 | HR 76 | Resp 17 | Ht 68.0 in | Wt 209.0 lb

## 2024-07-20 DIAGNOSIS — I10 Essential (primary) hypertension: Secondary | ICD-10-CM | POA: Diagnosis not present

## 2024-07-20 DIAGNOSIS — M150 Primary generalized (osteo)arthritis: Secondary | ICD-10-CM

## 2024-07-20 DIAGNOSIS — I2782 Chronic pulmonary embolism: Secondary | ICD-10-CM

## 2024-07-20 NOTE — Progress Notes (Signed)
 "   MRN : [id number]  Sandra Phillips is a 73 y.o. ([birth year]) female who presents with chief complaint of  Chief Complaint  Patient presents with   Follow-up    LS 08/03/23. Consult see JD. 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  History of Present Illness:   Discussed the use of AI scribe software for clinical note transcription with the patient, who gave verbal consent to proceed.  History of Present Illness Sandra Phillips is a 73 year old female with chronic pulmonary embolism who presents for vascular surgery consultation regarding perioperative thromboembolism prevention prior to upcoming orthopedic surgery.  She is scheduled for major orthopedic surgery on February 4th and is at elevated risk for perioperative venous thromboembolism due to previous pulmonary embolism and a high risk surgery.  She is maintained on apixaban , which her orthopedic surgeon plans to hold for 1-2 days preoperatively to reduce bleeding risk. She asks about enoxaparin  bridging and is advised this will likely not be needed with planned inferior vena cava filter placement.  Perioperative placement of a temporary inferior vena cava filter is discussed as prophylaxis. The procedure is minimally invasive via needle puncture, with planned removal about 4-6 weeks after surgery if no deep vein thrombosis is found on lower extremity ultrasound.    Results   Current Outpatient Medications  Medication Sig Dispense Refill   acetaminophen  (TYLENOL ) 500 MG tablet Take by mouth.     cholecalciferol (VITAMIN D3) 25 MCG (1000 UNIT) tablet Take 1,000 Units by mouth daily. Take 5 times a week     ELIQUIS  5 MG TABS tablet TAKE 1 TABLET BY MOUTH TWICE DAILY 60 tablet 11   metoprolol  succinate (TOPROL -XL) 25 MG 24 hr tablet Take 1 tablet (25 mg total) by mouth daily. 90 tablet 1   rosuvastatin  (CRESTOR ) 10 MG tablet TAKE 1 TABLET BY MOUTH ONCE DAILY 90 tablet 1   telmisartan  (MICARDIS )  40 MG tablet TAKE 1 TABLET BY MOUTH AT BEDTIME. 90 tablet 1   Vitamin D , Ergocalciferol , (DRISDOL ) 1.25 MG (50000 UNIT) CAPS capsule TAKE 1 CAPSULE BY MOUTH ONCE WEEKLY (Patient not taking: Reported on 07/20/2024) 4 capsule 0   No current facility-administered medications for this visit.    Past Medical History:  Diagnosis Date   Allergy    shrimp   Cancer (HCC)    endometrial   COVID-19    12/13/21   Current long-term use of anticoagulant medication with history of deep venous thrombosis (DVT)    History of DVT (deep vein thrombosis)    History of radiation therapy    Vagina (HDR) 06/26/22-07/10/22- Dr. Lynwood Nasuti   Hypertension    Primary osteoarthritis     Past Surgical History:  Procedure Laterality Date   ABDOMINAL HYSTERECTOMY     COLONOSCOPY WITH PROPOFOL  N/A 01/11/2022   Procedure: COLONOSCOPY WITH PROPOFOL ;  Surgeon: Therisa Bi, MD;  Location: [facility] ENDOSCOPY;  Service: Gastroenterology;  Laterality: N/A;   COLONOSCOPY WITH PROPOFOL  N/A 03/20/2023   Procedure: COLONOSCOPY WITH PROPOFOL ;  Surgeon: Therisa Bi, MD;  Location: [facility] ENDOSCOPY;  Service: Gastroenterology;  Laterality: N/A;   KNEE ARTHROSCOPY Left 06/12/2021   Procedure: LEFT KNEE ARTHROSCOPY WITH DEBRIDEMENT AND REPAIR OF A POSTERIOR MEDIAL ROOT TEAR AND ABRASION CONDOPLASTY;  Surgeon: Edie Norleen PARAS, MD;  Location: [facility] ORS;  Service: Orthopedics;  Laterality: Left;   PULMONARY THROMBECTOMY N/A 07/09/2021   Procedure: PULMONARY THROMBECTOMY;  Surgeon: Marea Selinda RAMAN, MD;  Location:  [facility] INVASIVE CV LAB;  Service: Cardiovascular;  Laterality: N/A;   ROBOTIC ASSISTED TOTAL HYSTERECTOMY  03/2022     Social History[1]    Family History  Problem Relation Age of Onset   Breast cancer Mother 17       pt also has bone and bladder   Cancer Mother    Rheum arthritis Sister      Allergies[2]   REVIEW OF SYSTEMS (Negative unless checked)  Constitutional: [] Weight loss  [] Fever  [] Chills Cardiac: [] Chest  pain   [] Chest pressure   [] Palpitations   [] Shortness of breath when laying flat   [] Shortness of breath at rest   [] Shortness of breath with exertion. Vascular:  [] Pain in legs with walking   [] Pain in legs at rest   [] Pain in legs when laying flat   [] Claudication   [] Pain in feet when walking  [] Pain in feet at rest  [] Pain in feet when laying flat   [x] History of DVT   [x] Phlebitis   [] Swelling in legs   [] Varicose veins   [] Non-healing ulcers Pulmonary:   [] Uses home oxygen   [] Productive cough   [] Hemoptysis   [] Wheeze  [] COPD   [] Asthma Neurologic:  [] Dizziness  [] Blackouts   [] Seizures   [] History of stroke   [] History of TIA  [] Aphasia   [] Temporary blindness   [] Dysphagia   [] Weakness or numbness in arms   [] Weakness or numbness in legs Musculoskeletal:  [] Arthritis   [] Joint swelling   [] Joint pain   [] Low back pain Hematologic:  [] Easy bruising  [] Easy bleeding   [] Hypercoagulable state   [x] Anemic   Gastrointestinal:  [] Blood in stool   [] Vomiting blood  [] Gastroesophageal reflux/heartburn   [] Abdominal pain Genitourinary:  [] Chronic kidney disease   [] Difficult urination  [] Frequent urination  [] Burning with urination   [] Hematuria Skin:  [] Rashes   [] Ulcers   [] Wounds Psychological:  [] History of anxiety   []  History of major depression.  Physical Examination  BP 128/72   Pulse 76   Resp 17   Ht 5' 8 (1.727 m)   Wt 209 lb (94.8 kg)   BMI 31.78 kg/m  Gen:  WD/WN, NAD.  Appears younger than stated age Head: [place]/AT, No temporalis wasting. Ear/Nose/Throat: Hearing grossly intact, nares w/o erythema or drainage Eyes: Conjunctiva clear. Sclera non-icteric Neck: Supple.  Trachea midline Pulmonary:  Good air movement, no use of accessory muscles.  Cardiac: RRR, no JVD Vascular:  Vessel Right Left  Radial Palpable Palpable               Musculoskeletal: M/S 5/5 throughout.  No deformity or atrophy.  No appreciable lower extremity edema. Neurologic: Sensation grossly intact  in extremities.  Symmetrical.  Speech is fluent.  Psychiatric: Judgment intact, Mood & affect appropriate for pt's clinical situation. Dermatologic: No rashes or ulcers noted.  No cellulitis or open wounds.  Physical Exam     Labs No results found for this or any previous visit (from the past 2160 hours).  Radiology No results found.  Assessment/Plan Assessment & Plan Chronic pulmonary embolism She was at elevated risk for recurrent venous thromboembolism perioperatively due to prior pulmonary embolism, anticipated immobility, and planned anticoagulation interruption for surgery. Prophylactic temporary IVC filter placement was recommended to prevent pulmonary embolism during anticoagulation hold. - Planned temporary IVC filter placement prior to orthopedic surgery. - Scheduled IVC filter placement for the week prior to or two days before surgery (February 4). - Arranged follow-up 4-6 weeks postoperatively, including lower extremity  ultrasound to assess for DVT prior to filter removal. - Planned removal of the IVC filter if postoperative ultrasound is negative for DVT. - Coordinated perioperative anticoagulation management with orthopedic surgery: Eliquis  to be discontinued two days prior to surgery and resumed postoperatively, typically on Thursday if surgery is Wednesday after surgery if no excessive bleeding.  Essential hypertension blood pressure control important in reducing the progression of atherosclerotic disease. On appropriate oral medications.    Selinda Gu, MD  07/20/2024 4:16 PM    This note was created with Dragon medical transcription system.  Any errors from dictation are purely unintentional     [1]  Social History Tobacco Use   Smoking status: Never   Smokeless tobacco: Never  Vaping Use   Vaping status: Never Used  Substance Use Topics   Alcohol use: Yes    Alcohol/week: 1.0 standard drink of alcohol    Types: 1 Glasses of wine per week     Comment: a glass at dinner   Drug use: Never  [2]  Allergies Allergen Reactions   Penicillins     Unknown childhood reaction    Alendronate  Itching   Shrimp Extract Swelling    Shrimp Shrimp    Shrimp Flavor Agent (Non-Screening) Swelling    Shrimp   "

## 2024-07-21 ENCOUNTER — Telehealth (INDEPENDENT_AMBULATORY_CARE_PROVIDER_SITE_OTHER): Payer: Self-pay

## 2024-07-21 NOTE — Telephone Encounter (Signed)
 Spoke with the patient and she has been scheduled for a IVC filter placement on 08/22/24 with a 6:45 am arrival time to the [facility]. Pre-procedure instructions were discussed and will be sent to Mychart and mailed.

## 2024-07-26 ENCOUNTER — Other Ambulatory Visit (INDEPENDENT_AMBULATORY_CARE_PROVIDER_SITE_OTHER): Payer: Self-pay | Admitting: Vascular Surgery

## 2024-07-30 ENCOUNTER — Other Ambulatory Visit: Payer: Self-pay | Admitting: Internal Medicine

## 2024-08-09 ENCOUNTER — Other Ambulatory Visit

## 2024-08-09 DIAGNOSIS — E559 Vitamin D deficiency, unspecified: Secondary | ICD-10-CM | POA: Diagnosis not present

## 2024-08-09 DIAGNOSIS — Z7901 Long term (current) use of anticoagulants: Secondary | ICD-10-CM | POA: Diagnosis not present

## 2024-08-09 DIAGNOSIS — E785 Hyperlipidemia, unspecified: Secondary | ICD-10-CM | POA: Diagnosis not present

## 2024-08-09 DIAGNOSIS — E1169 Type 2 diabetes mellitus with other specified complication: Secondary | ICD-10-CM | POA: Diagnosis not present

## 2024-08-09 LAB — COMPREHENSIVE METABOLIC PANEL WITH GFR
ALT: 16 U/L (ref 3–35)
AST: 16 U/L (ref 5–37)
Albumin: 4.3 g/dL (ref 3.5–5.2)
Alkaline Phosphatase: 69 U/L (ref 39–117)
BUN: 9 mg/dL (ref 6–23)
CO2: 28 meq/L (ref 19–32)
Calcium: 9.6 mg/dL (ref 8.4–10.5)
Chloride: 105 meq/L (ref 96–112)
Creatinine, Ser: 0.84 mg/dL (ref 0.40–1.20)
GFR: 70.02 mL/min
Glucose, Bld: 91 mg/dL (ref 70–99)
Potassium: 4.1 meq/L (ref 3.5–5.1)
Sodium: 142 meq/L (ref 135–145)
Total Bilirubin: 0.7 mg/dL (ref 0.2–1.2)
Total Protein: 6.7 g/dL (ref 6.0–8.3)

## 2024-08-09 LAB — CBC WITH DIFFERENTIAL/PLATELET
Basophils Absolute: 0 K/uL (ref 0.0–0.1)
Basophils Relative: 0.7 % (ref 0.0–3.0)
Eosinophils Absolute: 0.9 K/uL — ABNORMAL HIGH (ref 0.0–0.7)
Eosinophils Relative: 16.8 % — ABNORMAL HIGH (ref 0.0–5.0)
HCT: 38.5 % (ref 36.0–46.0)
Hemoglobin: 13.5 g/dL (ref 12.0–15.0)
Lymphocytes Relative: 24 % (ref 12.0–46.0)
Lymphs Abs: 1.2 K/uL (ref 0.7–4.0)
MCHC: 35.1 g/dL (ref 30.0–36.0)
MCV: 92.8 fl (ref 78.0–100.0)
Monocytes Absolute: 0.4 K/uL (ref 0.1–1.0)
Monocytes Relative: 7.8 % (ref 3.0–12.0)
Neutro Abs: 2.6 K/uL (ref 1.4–7.7)
Neutrophils Relative %: 50.7 % (ref 43.0–77.0)
Platelets: 168 K/uL (ref 150.0–400.0)
RBC: 4.15 Mil/uL (ref 3.87–5.11)
RDW: 13 % (ref 11.5–15.5)
WBC: 5.2 K/uL (ref 4.0–10.5)

## 2024-08-09 LAB — VITAMIN D 25 HYDROXY (VIT D DEFICIENCY, FRACTURES): VITD: 32.62 ng/mL (ref 30.00–100.00)

## 2024-08-11 ENCOUNTER — Other Ambulatory Visit (HOSPITAL_COMMUNITY): Payer: Self-pay

## 2024-08-11 ENCOUNTER — Encounter: Payer: Self-pay | Admitting: Internal Medicine

## 2024-08-11 ENCOUNTER — Telehealth: Payer: Self-pay

## 2024-08-11 ENCOUNTER — Ambulatory Visit: Admitting: Internal Medicine

## 2024-08-11 VITALS — BP 140/84 | HR 65 | Ht 68.0 in | Wt 207.4 lb

## 2024-08-11 DIAGNOSIS — E782 Mixed hyperlipidemia: Secondary | ICD-10-CM

## 2024-08-11 DIAGNOSIS — Z86711 Personal history of pulmonary embolism: Secondary | ICD-10-CM

## 2024-08-11 DIAGNOSIS — E1169 Type 2 diabetes mellitus with other specified complication: Secondary | ICD-10-CM

## 2024-08-11 DIAGNOSIS — M816 Localized osteoporosis [Lequesne]: Secondary | ICD-10-CM

## 2024-08-11 DIAGNOSIS — Z0000 Encounter for general adult medical examination without abnormal findings: Secondary | ICD-10-CM | POA: Diagnosis not present

## 2024-08-11 DIAGNOSIS — I10 Essential (primary) hypertension: Secondary | ICD-10-CM

## 2024-08-11 DIAGNOSIS — E669 Obesity, unspecified: Secondary | ICD-10-CM | POA: Diagnosis not present

## 2024-08-11 DIAGNOSIS — C541 Malignant neoplasm of endometrium: Secondary | ICD-10-CM

## 2024-08-11 DIAGNOSIS — I2782 Chronic pulmonary embolism: Secondary | ICD-10-CM

## 2024-08-11 DIAGNOSIS — D721 Eosinophilia, unspecified: Secondary | ICD-10-CM | POA: Diagnosis not present

## 2024-08-11 DIAGNOSIS — E785 Hyperlipidemia, unspecified: Secondary | ICD-10-CM

## 2024-08-11 DIAGNOSIS — Z1231 Encounter for screening mammogram for malignant neoplasm of breast: Secondary | ICD-10-CM

## 2024-08-11 MED ORDER — ROSUVASTATIN CALCIUM 10 MG PO TABS: 10.0000 mg | ORAL_TABLET | Freq: Every day | ORAL | 1 refills | Status: AC

## 2024-08-11 MED ORDER — TELMISARTAN 40 MG PO TABS: 40.0000 mg | ORAL_TABLET | Freq: Every morning | ORAL | 1 refills | Status: AC

## 2024-08-11 MED ORDER — DENOSUMAB 60 MG/ML ~~LOC~~ SOSY: 60.0000 mg | PREFILLED_SYRINGE | Freq: Once | SUBCUTANEOUS | Status: AC

## 2024-08-11 NOTE — Telephone Encounter (Signed)
 Prolia  VOB initiated via [email or web address]  Next Prolia  inj DUE: NEW START

## 2024-08-11 NOTE — Assessment & Plan Note (Signed)
 Secondary to post knee surgery DVT Dec 2022.  For IVC filter placement feb 2 (prior to right knee replacement Hooten)

## 2024-08-11 NOTE — Assessment & Plan Note (Addendum)
 She stopped alendronate  prior to July 2025 visit due to  .rash.   Evistat c/I due to DVT/PE  will petition for Prolia 

## 2024-08-11 NOTE — Patient Instructions (Addendum)
 Consider adding colace or Benefiber daily to help move bowels more easily without causing  diarrhea     Please check your blood pressure  ONCE A MONTH at home and send me the readings  in a few months  so I can determine if you need to start an additional  medication to lower your blood pressure .  Goal is 130/80 or less  Increase your vitamin D  dose to 4000 units daily until the spring..  then reduce dose to 2000   Return at end of February for labs (3-4 hour fast is plenty)  YOUR MAMMOGRAM Ihas been ordered , PLEASE CALL AND GET THIS SCHEDULED! [facility] - call [phone]  Sandra Phillips does  the scheduling for [facility] as well

## 2024-08-11 NOTE — Progress Notes (Unsigned)
 Patient ID: Sandra Phillips, female    DOB: April 28, 1951  Age: 74 y.o. MRN: [id number]  The patient is here for annual preventive examination and management of other chronic and acute problems.   The risk factors are reflected in the social history.  The roster of all physicians providing medical care to patient - is listed in the Snapshot section of the chart.  Activities of daily living:  The patient is 100% independent in all ADLs: dressing, toileting, feeding as well as independent mobility  Home safety : The patient has smoke detectors in the home. They wear seatbelts.  There are no firearms at home. There is no violence in the home.   There is no risks for hepatitis, STDs or HIV. There is no   history of blood transfusion. They have no travel history to infectious disease endemic areas of the world.  The patient has seen their dentist in the last six month. They have seen their eye doctor in the last year. They admit to slight hearing difficulty with regard to whispered voices and some television programs.  They have deferred audiologic testing in the last year.  They do not  have excessive sun exposure. Discussed the need for sun protection: hats, long sleeves and use of sunscreen if there is significant sun exposure.   Diet: the importance of a healthy diet is discussed. They do have a healthy diet.  The benefits of regular aerobic exercise were discussed. She walks 4 times per week ,  20 minutes.   Depression screen: there are no signs or vegative symptoms of depression- irritability, change in appetite, anhedonia, sadness/tearfullness.  Cognitive assessment: the patient manages all their financial and personal affairs and is actively engaged. They could relate day,date,year and events; recalled 2/3 objects at 3 minutes; performed clock-face test normally.  The following portions of the patient's history were reviewed and updated as appropriate: allergies, current medications, past family  history, past medical history,  past surgical history, past social history  and problem list.  Visual acuity was not assessed per patient preference since she has regular follow up with her ophthalmologist. Hearing and body mass index were assessed and reviewed.   During the course of the visit the patient was educated and counseled about appropriate screening and preventive services including : fall prevention , diabetes screening, nutrition counseling, colorectal cancer screening, and recommended immunizations.    CC: The primary encounter diagnosis was Encounter for screening mammogram for malignant neoplasm of breast. Diagnoses of Encounter for preventive health examination, Endometrial cancer (HCC), History of pulmonary embolism, Eosinophilia, unspecified type, Essential hypertension, Mixed hyperlipidemia, Localized osteoporosis without current pathological fracture, Chronic pulmonary embolism without acute cor pulmonale, unspecified pulmonary embolism type (HCC), Hyperlipidemia associated with type 2 diabetes mellitus (HCC), and Obesity (BMI 35.0-39.9 without comorbidity) were also pertinent to this visit.  Not exercising /walking due to persistent right knee pain except pre op leg exercises.  For TKR Feb 4 and IVC Feb 2  Intermittent discomfort left side abdomen (site of former tumor )  after eating  feels bloated   History Sandra Phillips has a past medical history of Allergy, Cancer (HCC), COVID-19, Current long-term use of anticoagulant medication with history of deep venous thrombosis (DVT), History of DVT (deep vein thrombosis), History of radiation therapy, Hypertension, and Primary osteoarthritis.   She has a past surgical history that includes Knee arthroscopy (Left, 06/12/2021); PULMONARY THROMBECTOMY (N/A, 07/09/2021); Colonoscopy with propofol  (N/A, 01/11/2022); Robotic assisted total hysterectomy (03/2022); Colonoscopy with propofol  (N/A,  03/20/2023); and Abdominal hysterectomy.   Her family  history includes Breast cancer (age of onset: 72) in her mother; Cancer in her mother; Rheum arthritis in her sister.She reports that she has never smoked. She has never used smokeless tobacco. She reports current alcohol use of about 1.0 standard drink of alcohol per week. She reports that she does not use drugs.  Outpatient Medications Prior to Visit  Medication Sig Dispense Refill   acetaminophen  (TYLENOL ) 500 MG tablet Take 500 mg by mouth every 6 (six) hours as needed for mild pain (pain score 1-3) or moderate pain (pain score 4-6).     cholecalciferol (VITAMIN D3) 25 MCG (1000 UNIT) tablet Take 4,000 Units by mouth in the morning.     ELIQUIS  5 MG TABS tablet TAKE 1 TABLET TWICE A DAY 60 tablet 11   metoprolol  succinate (TOPROL -XL) 25 MG 24 hr tablet TAKE 1 TABLET BY MOUTH ONCE DAILY (Patient taking differently: Take 25 mg by mouth at bedtime.) 90 tablet 1   Cholecalciferol (VITAMIN D3) 50 MCG (2000 UT) CAPS Take 1 capsule by mouth daily.     rosuvastatin  (CRESTOR ) 10 MG tablet TAKE 1 TABLET BY MOUTH ONCE DAILY 90 tablet 1   telmisartan  (MICARDIS ) 40 MG tablet TAKE 1 TABLET BY MOUTH AT BEDTIME. 90 tablet 1   Vitamin D , Ergocalciferol , (DRISDOL ) 1.25 MG (50000 UNIT) CAPS capsule TAKE 1 CAPSULE BY MOUTH ONCE WEEKLY (Patient not taking: Reported on 07/20/2024) 4 capsule 0   No facility-administered medications prior to visit.    Review of Systems  Patient denies headache, fevers, malaise, unintentional weight loss, skin rash, eye pain, sinus congestion and sinus pain, sore throat, dysphagia,  hemoptysis , cough, dyspnea, wheezing, chest pain, palpitations, orthopnea, edema, abdominal pain, nausea, melena, diarrhea, constipation, flank pain, dysuria, hematuria, urinary  Frequency, nocturia, numbness, tingling, seizures,  Focal weakness, Loss of consciousness,  Tremor, insomnia, depression, anxiety, and suicidal ideation.     Objective:  BP (!) 140/84   Pulse 65   Ht 5' 8 (1.727 m)   Wt  207 lb 6.4 oz (94.1 kg)   SpO2 95%   BMI 31.54 kg/m   Physical Exam Vitals reviewed.  Constitutional:      General: She is not in acute distress.    Appearance: Normal appearance. She is well-developed and normal weight. She is not ill-appearing, toxic-appearing or diaphoretic.  HENT:     Head: Normocephalic.     Right Ear: Tympanic membrane, ear canal and external ear normal. There is no impacted cerumen.     Left Ear: Tympanic membrane, ear canal and external ear normal. There is no impacted cerumen.     Nose: Nose normal.     Mouth/Throat:     Mouth: Mucous membranes are moist.     Pharynx: Oropharynx is clear.  Eyes:     General: No scleral icterus.       Right eye: No discharge.        Left eye: No discharge.     Conjunctiva/sclera: Conjunctivae normal.     Pupils: Pupils are equal, round, and reactive to light.  Neck:     Thyroid : No thyromegaly.     Vascular: No carotid bruit or JVD.  Cardiovascular:     Rate and Rhythm: Normal rate and regular rhythm.     Heart sounds: Normal heart sounds.  Pulmonary:     Effort: Pulmonary effort is normal. No respiratory distress.     Breath sounds: Normal breath sounds.  Chest:  Breasts:    Breasts are symmetrical.     Right: Normal. No swelling, inverted nipple, mass, nipple discharge, skin change or tenderness.     Left: Normal. No swelling, inverted nipple, mass, nipple discharge, skin change or tenderness.  Abdominal:     General: Bowel sounds are normal.     Palpations: Abdomen is soft. There is no mass.     Tenderness: There is no abdominal tenderness. There is no guarding or rebound.  Musculoskeletal:        General: Normal range of motion.     Cervical back: Normal range of motion and neck supple.  Lymphadenopathy:     Cervical: No cervical adenopathy.     Upper Body:     Right upper body: No supraclavicular, axillary or pectoral adenopathy.     Left upper body: No supraclavicular, axillary or pectoral adenopathy.   Skin:    General: Skin is warm and dry.  Neurological:     General: No focal deficit present.     Mental Status: She is alert and oriented to person, place, and time. Mental status is at baseline.  Psychiatric:        Mood and Affect: Mood normal.        Behavior: Behavior normal.        Thought Content: Thought content normal.        Judgment: Judgment normal.     Assessment & Plan:  Encounter for screening mammogram for malignant neoplasm of breast -     3D Screening Mammogram, Left and Right; Future  Encounter for preventive health examination Assessment & Plan: age appropriate education and counseling updated, referrals for preventative services and immunizations addressed, dietary and smoking counseling addressed, most recent labs reviewed.  I have personally reviewed and have noted:   1) the patient's medical and social history 2) The pt's use of alcohol, tobacco, and illicit drugs 3) The patient's current medications and supplements 4) Functional ability including ADL's, fall risk, home safety risk, hearing and visual impairment 5) Diet and physical activities 6) Evidence for depression or mood disorder 7) The patient's height, weight, and BMI have been recorded in the chart   Endometrial cancer [facility]) Assessment & Plan: No recurrence per GYN jan 2026    History of pulmonary embolism Assessment & Plan: Secondary to post knee surgery DVT Dec 2022.  For IVC filter placement feb 2 (prior to right knee replacement [facility])   Eosinophilia, unspecified type -     CBC with Differential/Platelet; Future  Essential hypertension Assessment & Plan: she reports compliance with medication regimen  but has an elevated reading today in office.  She is not using NSAIDs daily.  Discussed goal of 120/70  (130/80 for patients over 70)  to preserve renal function.  She has been asked to check her  BP  at home and  submit readings for evaluation. Renal function, electrolytes and screen  for proteinuria are all normal    Lab Results  Component Value Date   CREATININE 0.84 08/09/2024   Lab Results  Component Value Date   NA 142 08/09/2024   K 4.1 08/09/2024   CL 105 08/09/2024   CO2 28 08/09/2024     Orders: -     Basic metabolic panel with GFR; Future  Mixed hyperlipidemia -     Lipid panel; Future  Localized osteoporosis without current pathological fracture Assessment & Plan: She stopped alendronate  prior to July 2025 visit due to  .rash.   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  Currently using Eliqius  Reminded that she will need suspension prior to oral surgery and other procedures   Hyperlipidemia associated with type 2 diabetes mellitus (HCC)  Obesity (BMI 35.0-39.9 without comorbidity) Assessment & Plan: I have congratulated her in reduction of   BMI and encouraged  Continued weight loss with goal of 10% of body weigh over the next 6 months using a low glycemic index diet and regular exercise a minimum of 5 days per week.     Other orders -     Rosuvastatin  Calcium ; Take 1 tablet (10 mg total) by mouth daily. (Patient taking differently: Take 10 mg by mouth at bedtime.)  Dispense: 90 tablet; Refill: 1 -     Telmisartan ; Take 1 tablet (40 mg total) by mouth every morning.  Dispense: 90 tablet; Refill: 1      I provided 40 minutes of  face-to-face time during this encounter reviewing patient's current problems and past surgeries,  recent labs and imaging studies, providing counseling on the above mentioned problems , and coordination  of care .   Follow-up: Return in about 6 months (around 02/08/2025).   Sandra LITTIE Kettering, MD

## 2024-08-11 NOTE — Telephone Encounter (Signed)
 Requesting new start for Prolia , patient seen in office today.  Per Dr. Marylynn, patient has h/o rash to alendronate  and cannot take evista due to history of DVT/PE.

## 2024-08-11 NOTE — Assessment & Plan Note (Signed)
 No recurrence per GYN jan 2026

## 2024-08-12 DIAGNOSIS — E1169 Type 2 diabetes mellitus with other specified complication: Secondary | ICD-10-CM | POA: Insufficient documentation

## 2024-08-12 NOTE — Assessment & Plan Note (Signed)

## 2024-08-12 NOTE — Assessment & Plan Note (Addendum)
 she reports compliance with medication regimen  but has an elevated reading today in office.  She is not using NSAIDs daily.  Discussed goal of 120/70  (130/80 for patients over 70)  to preserve renal function.  She has been asked to check her  BP  at home and  submit readings for evaluation. Renal function, electrolytes and screen for proteinuria are all normal    Lab Results  Component Value Date   CREATININE 0.84 08/09/2024   Lab Results  Component Value Date   NA 142 08/09/2024   K 4.1 08/09/2024   CL 105 08/09/2024   CO2 28 08/09/2024

## 2024-08-12 NOTE — Assessment & Plan Note (Signed)
 I have congratulated her in reduction of   BMI and encouraged  Continued weight loss with goal of 10% of body weigh over the next 6 months using a low glycemic index diet and regular exercise a minimum of 5 days per week.

## 2024-08-12 NOTE — Assessment & Plan Note (Signed)
 Lifelong anticoagulation recommended as long as she remains ambulatory without falls.  Currently using Eliqius  Reminded that she will need suspension prior to oral surgery and other procedures

## 2024-08-13 ENCOUNTER — Other Ambulatory Visit (HOSPITAL_COMMUNITY): Payer: Self-pay

## 2024-08-13 ENCOUNTER — Ambulatory Visit (INDEPENDENT_AMBULATORY_CARE_PROVIDER_SITE_OTHER): Admitting: Vascular Surgery

## 2024-08-14 ENCOUNTER — Other Ambulatory Visit: Payer: Self-pay

## 2024-08-14 ENCOUNTER — Other Ambulatory Visit (HOSPITAL_COMMUNITY): Payer: Self-pay

## 2024-08-14 ENCOUNTER — Encounter: Payer: Self-pay | Admitting: Urgent Care

## 2024-08-16 ENCOUNTER — Other Ambulatory Visit (HOSPITAL_COMMUNITY): Payer: Self-pay

## 2024-08-16 NOTE — Discharge Instructions (Signed)

## 2024-08-17 ENCOUNTER — Other Ambulatory Visit: Payer: Self-pay

## 2024-08-17 ENCOUNTER — Other Ambulatory Visit (HOSPITAL_COMMUNITY): Payer: Self-pay

## 2024-08-18 ENCOUNTER — Other Ambulatory Visit (HOSPITAL_COMMUNITY): Payer: Self-pay

## 2024-08-18 NOTE — Telephone Encounter (Signed)
 [facility]

## 2024-08-18 NOTE — Telephone Encounter (Signed)
 Benefit verification started in new encounter. Will update referral once complete.

## 2024-08-18 NOTE — Telephone Encounter (Signed)
 MEDICAL PA SUBMITTED VIA LATENT. KEY: [id number]   [facility] PREFERRED FOR PHARMACY. PA SUBMITTED VIA LATENT. KEY: [id number]

## 2024-08-19 ENCOUNTER — Other Ambulatory Visit (HOSPITAL_COMMUNITY): Payer: Self-pay

## 2024-08-19 ENCOUNTER — Other Ambulatory Visit: Payer: Self-pay

## 2024-08-19 ENCOUNTER — Encounter
Admission: RE | Admit: 2024-08-19 | Discharge: 2024-08-19 | Disposition: A | Discharge status: Home / Self Care | Source: Ambulatory Visit | Attending: Orthopedic Surgery | Admitting: Orthopedic Surgery

## 2024-08-19 ENCOUNTER — Inpatient Hospital Stay: Admission: RE | Admit: 2024-08-19 | Source: Ambulatory Visit

## 2024-08-19 ENCOUNTER — Other Ambulatory Visit: Payer: Self-pay | Admitting: Internal Medicine

## 2024-08-19 VITALS — BP 142/86 | HR 63 | Resp 14 | Ht 68.0 in | Wt 208.2 lb

## 2024-08-19 DIAGNOSIS — M1711 Unilateral primary osteoarthritis, right knee: Secondary | ICD-10-CM | POA: Diagnosis not present

## 2024-08-19 DIAGNOSIS — R8290 Unspecified abnormal findings in urine: Secondary | ICD-10-CM | POA: Insufficient documentation

## 2024-08-19 DIAGNOSIS — R001 Bradycardia, unspecified: Secondary | ICD-10-CM | POA: Diagnosis not present

## 2024-08-19 DIAGNOSIS — Z79899 Other long term (current) drug therapy: Secondary | ICD-10-CM | POA: Insufficient documentation

## 2024-08-19 DIAGNOSIS — R8281 Pyuria: Secondary | ICD-10-CM | POA: Insufficient documentation

## 2024-08-19 DIAGNOSIS — Z01812 Encounter for preprocedural laboratory examination: Secondary | ICD-10-CM

## 2024-08-19 DIAGNOSIS — Z01818 Encounter for other preprocedural examination: Secondary | ICD-10-CM | POA: Diagnosis present

## 2024-08-19 DIAGNOSIS — Z01810 Encounter for preprocedural cardiovascular examination: Secondary | ICD-10-CM

## 2024-08-19 DIAGNOSIS — R8271 Bacteriuria: Secondary | ICD-10-CM | POA: Insufficient documentation

## 2024-08-19 DIAGNOSIS — R9431 Abnormal electrocardiogram [ECG] [EKG]: Secondary | ICD-10-CM | POA: Diagnosis not present

## 2024-08-19 DIAGNOSIS — Z880 Allergy status to penicillin: Secondary | ICD-10-CM

## 2024-08-19 HISTORY — DX: Tachycardia, unspecified: R00.0

## 2024-08-19 HISTORY — DX: Benign neoplasm of colon, unspecified: D12.6

## 2024-08-19 HISTORY — DX: Malignant neoplasm of endometrium: C54.1

## 2024-08-19 HISTORY — DX: Long term (current) use of anticoagulants: Z79.01

## 2024-08-19 HISTORY — DX: Vitamin D deficiency, unspecified: E55.9

## 2024-08-19 HISTORY — DX: Unilateral primary osteoarthritis, right knee: M17.11

## 2024-08-19 HISTORY — DX: Atherosclerosis of aorta: I70.0

## 2024-08-19 HISTORY — DX: Hyperlipidemia, unspecified: E78.5

## 2024-08-19 LAB — SEDIMENTATION RATE: Sed Rate: 10 mm/h (ref 0–30)

## 2024-08-19 LAB — URINALYSIS, ROUTINE W REFLEX MICROSCOPIC
Bilirubin Urine: NEGATIVE
Glucose, UA: NEGATIVE mg/dL
Hgb urine dipstick: NEGATIVE
Ketones, ur: NEGATIVE mg/dL
Nitrite: NEGATIVE
Protein, ur: NEGATIVE mg/dL
Specific Gravity, Urine: 1.01 (ref 1.005–1.030)
pH: 6 (ref 5.0–8.0)

## 2024-08-19 LAB — BASIC METABOLIC PANEL WITH GFR
Anion gap: 11 (ref 5–15)
BUN: 11 mg/dL (ref 8–23)
CO2: 26 mmol/L (ref 22–32)
Calcium: 9.6 mg/dL (ref 8.9–10.3)
Chloride: 105 mmol/L (ref 98–111)
Creatinine, Ser: 0.76 mg/dL (ref 0.44–1.00)
GFR, Estimated: >60 mL/min
Glucose, Bld: 103 mg/dL — ABNORMAL HIGH (ref 70–99)
Potassium: 3.9 mmol/L (ref 3.5–5.1)
Sodium: 143 mmol/L (ref 135–145)

## 2024-08-19 LAB — TSH: TSH: 1.66 u[IU]/mL (ref 0.350–4.500)

## 2024-08-19 LAB — SURGICAL PCR SCREEN
MRSA, PCR: NEGATIVE
Staphylococcus aureus: NEGATIVE

## 2024-08-19 LAB — C-REACTIVE PROTEIN: CRP: <0.5 mg/dL

## 2024-08-19 LAB — MAGNESIUM: Magnesium: 2.3 mg/dL (ref 1.7–2.4)

## 2024-08-19 NOTE — Patient Instructions (Addendum)
 Your procedure is scheduled on:08-25-24 Wednesday Report to the Registration Desk on the 1st floor of the [facility].Then proceed to the 2nd floor Surgery Desk To find out your arrival time, please call [phone] between 1PM - 3PM on:08-24-24 Tuesday If your arrival time is 6:00 am, do not arrive before that time as the [facility] entrance doors do not open until 6:00 am.  REMEMBER: Instructions that are not followed completely may result in serious medical risk, up to and including death; or upon the discretion of your surgeon and anesthesiologist your surgery may need to be rescheduled.  Do not eat food after midnight the night before surgery.  No gum chewing or hard candies.  You may however, drink CLEAR liquids up to 2 hours before you are scheduled to arrive for your surgery. Do not drink anything within 2 hours of your scheduled arrival time.  Clear liquids include: - water  - apple juice without pulp - gatorade (not RED colors) - black coffee or tea (Do NOT add milk or creamers to the coffee or tea) Do NOT drink anything that is not on this list.  In addition, your doctor has ordered for you to drink the provided:  Ensure Pre-Surgery Clear Carbohydrate Drink  Drinking this carbohydrate drink up to two hours before surgery helps to reduce insulin resistance and improve patient outcomes. Please complete drinking 2 hours before scheduled arrival time.  One week prior to surgery:Stop NOW (08-19-24) Stop Anti-inflammatories (NSAIDS) such as Advil, Aleve, Ibuprofen, Motrin, Naproxen, Naprosyn and Aspirin based products such as Excedrin, Goody's Powder, BC Powder. Stop ANY OVER THE COUNTER supplements until after surgery (Vitamin D3)  You may however, continue to take Tylenol  if needed for pain up until the day of surgery.  Stop ELIQUIS  3 days prior to surgery-Last dose will be on 08-21-24 Saturday  Continue taking all of your other prescription medications up until the day of  surgery.  Do NOT take any medication the day of surgery  No Alcohol for 24 hours before or after surgery.  No Smoking including e-cigarettes for 24 hours before surgery.  No chewable tobacco products for at least 6 hours before surgery.  No nicotine patches on the day of surgery.  Do not use any recreational drugs for at least a week (preferably 2 weeks) before your surgery.  Please be advised that the combination of cocaine and anesthesia may have negative outcomes, up to and including death. If you test positive for cocaine, your surgery will be cancelled.  On the morning of surgery brush your teeth with toothpaste and water, you may rinse your mouth with mouthwash if you wish. Do not swallow any toothpaste or mouthwash.  Use CHG Soap as directed on instruction sheet.  Do not wear jewelry, make-up, hairpins, clips or nail polish.  For welded (permanent) jewelry: bracelets, anklets, waist bands, etc.  Please have this removed prior to surgery.  If it is not removed, there is a chance that hospital personnel will need to cut it off on the day of surgery.  Do not wear lotions, powders, or perfumes.   Do not shave body hair from the neck down 48 hours before surgery.  Contact lenses, hearing aids and dentures may not be worn into surgery.  Do not bring valuables to the hospital. [facility] is not responsible for any missing/lost belongings or valuables.    Notify your doctor if there is any change in your medical condition (cold, fever, infection).  Wear comfortable  clothing (specific to your surgery type) to the hospital.  After surgery, you can help prevent lung complications by doing breathing exercises.  Take deep breaths and cough every 1-2 hours. Your doctor may order a device called an Incentive Spirometer to help you take deep breaths. When coughing or sneezing, hold a pillow firmly against your incision with both hands. This is called splinting. Doing this helps  protect your incision. It also decreases belly discomfort.  If you are being admitted to the hospital overnight, leave your suitcase in the car. After surgery it may be brought to your room.  In case of increased patient census, it may be necessary for you, the patient, to continue your postoperative care in the Same Day Surgery department.  If you are being discharged the day of surgery, you will not be allowed to drive home. You will need a responsible individual to drive you home and stay with you for 24 hours after surgery.   If you are taking public transportation, you will need to have a responsible individual with you.  Please call the Pre-admissions Testing Dept. at [phone] if you have any questions about these instructions.  Surgery Visitation Policy:  Patients having surgery or a procedure may have two visitors.  Children under the age of 61 must have an adult with them who is not the patient.  Inpatient Visitation:    Visiting hours are 7 a.m. to 8 p.m. Up to four visitors are allowed at one time in a patient room. The visitors may rotate out with other people during the day.  One visitor age 57 or older may stay with the patient overnight and must be in the room by 8 p.m.    Pre-operative 4 CHG Bath Instructions   You can play a key role in reducing the risk of infection after surgery. Your skin needs to be as free of germs as possible. You can reduce the number of germs on your skin by washing with CHG (chlorhexidine  gluconate) soap before surgery. CHG is an antiseptic soap that kills germs and continues to kill germs even after washing.   DO NOT use if you have an allergy to chlorhexidine /CHG or antibacterial soaps. If your skin becomes reddened or irritated, stop using the CHG and notify one of our RNs at [phone].   Please shower with the CHG soap starting 4 days before surgery using the following schedule:     Please keep in mind the following:  DO NOT  shave, including legs and underarms, starting the day of your first shower.   You may shave your face at any point before/day of surgery.  Place clean sheets on your bed the day you start using CHG soap. Use a clean washcloth (not used since being washed) for each shower. DO NOT sleep with pets once you start using the CHG.   CHG Shower Instructions:  If you choose to wash your hair and private area, wash first with your normal shampoo/soap.  After you use shampoo/soap, rinse your hair and body thoroughly to remove shampoo/soap residue.  Turn the water OFF and apply about 3 tablespoons (45 ml) of CHG soap to a CLEAN washcloth.  Apply CHG soap ONLY FROM YOUR NECK DOWN TO YOUR TOES (washing for 3-5 minutes)  DO NOT use CHG soap on face, private areas, open wounds, or sores.  Pay special attention to the area where your surgery is being performed.  If you are having back surgery, having someone wash  your back for you may be helpful. Wait 2 minutes after CHG soap is applied, then you may rinse off the CHG soap.  Pat dry with a clean towel  Put on clean clothes/pajamas   If you choose to wear lotion, please use ONLY the CHG-compatible lotions on the back of this paper.     Additional instructions for the day of surgery: DO NOT APPLY any lotions, deodorants, cologne, or perfumes.   Put on clean/comfortable clothes.  Brush your teeth.  Ask your nurse before applying any prescription medications to the skin.      CHG Compatible Lotions   Aveeno Moisturizing lotion  Cetaphil Moisturizing Cream  Cetaphil Moisturizing Lotion  Clairol Herbal Essence Moisturizing Lotion, Dry Skin  Clairol Herbal Essence Moisturizing Lotion, Extra Dry Skin  Clairol Herbal Essence Moisturizing Lotion, Normal Skin  Curel Age Defying Therapeutic Moisturizing Lotion with Alpha Hydroxy  Curel Extreme Care Body Lotion  Curel Soothing Hands Moisturizing Hand Lotion  Curel Therapeutic Moisturizing Cream,  Fragrance-Free  Curel Therapeutic Moisturizing Lotion, Fragrance-Free  Curel Therapeutic Moisturizing Lotion, Original Formula  Eucerin Daily Replenishing Lotion  Eucerin Dry Skin Therapy Plus Alpha Hydroxy Crme  Eucerin Dry Skin Therapy Plus Alpha Hydroxy Lotion  Eucerin Original Crme  Eucerin Original Lotion  Eucerin Plus Crme Eucerin Plus Lotion  Eucerin TriLipid Replenishing Lotion  Keri Anti-Bacterial Hand Lotion  Keri Deep Conditioning Original Lotion Dry Skin Formula Softly Scented  Keri Deep Conditioning Original Lotion, Fragrance Free Sensitive Skin Formula  Keri Lotion Fast Absorbing Fragrance Free Sensitive Skin Formula  Keri Lotion Fast Absorbing Softly Scented Dry Skin Formula  Keri Original Lotion  Keri Skin Renewal Lotion Keri Silky Smooth Lotion  Keri Silky Smooth Sensitive Skin Lotion  Nivea Body Creamy Conditioning Oil  Nivea Body Extra Enriched Lotion  Nivea Body Original Lotion  Nivea Body Sheer Moisturizing Lotion Nivea Crme  Nivea Skin Firming Lotion  NutraDerm 30 Skin Lotion  NutraDerm Skin Lotion  NutraDerm Therapeutic Skin Cream  NutraDerm Therapeutic Skin Lotion  ProShield Protective Hand Cream  Provon moisturizing lotion  How to Use an Incentive Spirometer An incentive spirometer is a tool that measures how well you are filling your lungs with each breath. Learning to take long, deep breaths using this tool can help you keep your lungs clear and active. This may help to reverse or lessen your chance of developing breathing (pulmonary) problems, especially infection. You may be asked to use a spirometer: After a surgery. If you have a lung problem or a history of smoking. After a long period of time when you have been unable to move or be active. If the spirometer includes an indicator to show the highest number that you have reached, your health care provider or respiratory therapist will help you set a goal. Keep a log of your progress as told by  your health care provider. What are the risks? Breathing too quickly may cause dizziness or cause you to pass out. Take your time so you do not get dizzy or light-headed. If you are in pain, you may need to take pain medicine before doing incentive spirometry. It is harder to take a deep breath if you are having pain. How to use your incentive spirometer  Sit up on the edge of your bed or on a chair. Hold the incentive spirometer so that it is in an upright position. Before you use the spirometer, breathe out normally. Place the mouthpiece in your mouth. Make sure your lips are closed  tightly around it. Breathe in slowly and as deeply as you can through your mouth, causing the piston or the ball to rise toward the top of the chamber. Hold your breath for 3-5 seconds, or for as long as possible. If the spirometer includes a coach indicator, use this to guide you in breathing. Slow down your breathing if the indicator goes above the marked areas. Remove the mouthpiece from your mouth and breathe out normally. The piston or ball will return to the bottom of the chamber. Rest for a few seconds, then repeat the steps 10 or more times. Take your time and take a few normal breaths between deep breaths so that you do not get dizzy or light-headed. Do this every 1-2 hours when you are awake. If the spirometer includes a goal marker to show the highest number you have reached (best effort), use this as a goal to work toward during each repetition. After each set of 10 deep breaths, cough a few times. This will help to make sure that your lungs are clear. If you have an incision on your chest or abdomen from surgery, place a pillow or a rolled-up towel firmly against the incision when you cough. This can help to reduce pain while taking deep breaths and coughing. General tips When you are able to get out of bed: Walk around often. Continue to take deep breaths and cough in order to clear your  lungs. Keep using the incentive spirometer until your health care provider says it is okay to stop using it. If you have been in the hospital, you may be told to keep using the spirometer at home. Contact a health care provider if: You are having difficulty using the spirometer. You have trouble using the spirometer as often as instructed. Your pain medicine is not giving enough relief for you to use the spirometer as told. You have a fever. Get help right away if: You develop shortness of breath. You develop a cough with bloody mucus from the lungs. You have fluid or blood coming from an incision site after you cough. Summary An incentive spirometer is a tool that can help you learn to take long, deep breaths to keep your lungs clear and active. You may be asked to use a spirometer after a surgery, if you have a lung problem or a history of smoking, or if you have been inactive for a long period of time. Use your incentive spirometer as instructed every 1-2 hours while you are awake. If you have an incision on your chest or abdomen, place a pillow or a rolled-up towel firmly against your incision when you cough. This will help to reduce pain. Get help right away if you have shortness of breath, you cough up bloody mucus, or blood comes from your incision when you cough. This information is not intended to replace advice given to you by your health care provider. Make sure you discuss any questions you have with your health care provider. Document Revised: 05/16/2023 Document Reviewed: 05/16/2023 Elsevier Patient Education  2024 Elsevier Inc.  Preoperative Educational Videos for Total Hip, Knee and Shoulder Replacements  To better prepare for surgery, please view our videos that explain the physical activity and discharge planning required to have the best surgical recovery at [facility].  [email or web address]  Questions? Call [phone] or email [email or web address]        Community Resource Directory to address health-related social needs:  [email or web address]

## 2024-08-19 NOTE — Progress Notes (Signed)
 Review of Systems  Constitutional:  Positive for activity change.  HENT:  Positive for postnasal drip.   Eyes: Negative.   Respiratory: Negative.    Cardiovascular: Negative.   Gastrointestinal: Negative.   Endocrine: Positive for cold intolerance.  Genitourinary: Negative.   Musculoskeletal:  Positive for arthralgias, gait problem, joint swelling and myalgias.  Skin: Negative.   Allergic/Immunologic: Positive for food allergies.  Hematological:  Bruises/bleeds easily.  Psychiatric/Behavioral: Negative.

## 2024-08-19 NOTE — Telephone Encounter (Signed)
 PHARMACY PA APPROVED    AUTH #: [id number] $502.25

## 2024-08-19 NOTE — Telephone Encounter (Signed)
 MEDICAL PA APPROVED   AUTH #: [id number]

## 2024-08-19 NOTE — Telephone Encounter (Signed)
 Pt ready for scheduling for PROLIA  on or after : 08/19/24  Option# 1: Buy/Bill (Office supplied medication)  Out-of-pocket cost due at time of clinic visit: $352  Number of injection/visits approved: 2  Primary: [employer] Prolia  co-insurance: 20% Admin fee co-insurance: 0%  Secondary: --- Prolia  co-insurance:  Admin fee co-insurance:   Medical Benefit Details: Date Benefits were checked: 08/18/24 Deductible: NO/ Coinsurance: 20%/ Admin Fee: 0%  Prior Auth: APPROVED PA# [id number] Expiration Date: 08/18/24-08/18/25  # of doses approved: 2 ----------------------------------------------------------------------- Option# 2- Med Obtained from pharmacy: [facility]   Pharmacy benefit: Copay $502.25 (Paid to pharmacy) Admin Fee: 0% (Pay at clinic)  Prior Auth: APPROVED PA# [id number] Expiration Date: 08/18/24-08/18/25  # of doses approved: 2   If patient wants fill through the pharmacy benefit please send prescription to: [facility], and include estimated need by date in rx notes. Pharmacy will ship medication directly to the office.  Patient NOT eligible for Prolia  Copay Card. Copay Card can make patient's cost as little as $25. Link to apply: [email or web address]://[email or web address]  ** This summary of benefits is an estimation of the patient's out-of-pocket cost. Exact cost may very based on individual plan coverage.

## 2024-08-19 NOTE — Progress Notes (Signed)
 " Surgical Admission History & Physical   NAME: Sandra Phillips     H&P Date: 08/19/2024  Procedure Date: 08/25/24  Chief Complaint: right knee pain  HPI  Sandra Phillips is a 74 y.o. female who has severe knee pain and has failed conservative treatment including NSAID's, injections, PT, HEP, and activity modification.  The patient states that the knee pain has progressed to the point that it is significantly interfering with her activities of daily living.  She has requested operative intervention for relief of her DJD symptoms.     She has history of DVT, will have IVC filter placement on 08/23/2024 by Dr. Marea. Patient denies other recent changes to medical history.   She reports no other relevant cardiac or pulmonary history. No previous joint surgery.  Social History: Denies smoking, alcohol, and illicit drug use  Medications & Allergies  Allergies: Allergies  Allergen Reactions   Penicillins Anaphylaxis   Shrimp Swelling    Shrimp   Alendronate  Itching   Oxycodone Nausea And Vomiting    Dizzy    Home Medicines: Current Outpatient Medications on File Prior to Visit  Medication Sig Dispense Refill   acetaminophen  (TYLENOL ) 500 MG tablet Take 1,000 mg by mouth every 6 (six) hours as needed for Pain     cholecalciferol (VITAMIN D3) 1000 unit tablet Take 4,000 Units by mouth once daily     [START ON 08/25/2024] denosumab  (PROLIA ) 60 mg/mL inj syringe Inject 60 mg subcutaneously once     docusate (COLACE) 100 MG capsule Take 100 mg by mouth at bedtime     metoprolol  SUCCinate (TOPROL -XL) 25 MG XL tablet Take 1 tablet by mouth once daily     rosuvastatin  (CRESTOR ) 10 MG tablet Take 1 tablet by mouth once daily     telmisartan  (MICARDIS ) 40 MG tablet Take 40 mg by mouth every morning     amoxicillin -clavulanate (AUGMENTIN ) 875-125 mg tablet Take 1 tablet by mouth 2 (two) times daily (Patient not taking: Reported on 08/19/2024)     apixaban  (ELIQUIS ) 5 mg tablet Take 5 mg by mouth  2 (two) times daily     clindamycin  (CLEOCIN ) 300 MG capsule Take 300 mg by mouth 3 (three) times daily (Patient not taking: Reported on 08/19/2024)     ergocalciferol , vitamin D2, 1,250 mcg (50,000 unit) capsule Take 50,000 Units by mouth every 7 (seven) days (Patient not taking: Reported on 08/19/2024)     No current facility-administered medications on file prior to visit.    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 The right atrium and right ventricle are dilated with RV-LV ratio measuring 1.6, consistent with right heart strain.     Osteoporosis 04/05/2022   T score -2.9 left Hip,  By Sept 2023 DEXA     Sinus tachycardia 07/07/2021   Vitamin D  deficiency 05/08/2022     Past Surgical History:  Procedure Laterality Date    Arthroscopic debridement/abrasion chondroplasty with arthroscopically-assisted repair of medial meniscus root tear, left knee Left 06/12/2021  Dr.Poggi   wisdom teeth       Physical Exam   Ht:172.7 cm (5' 8) Wt:94.2 kg (207 lb 9.6 oz) BMI: Body mass index is 31.57 kg/m. Vitals:   08/19/24 0830 08/19/24 0838  BP: 130/80   Weight: 94.2 kg (207 lb 9.6 oz)   Height: 172.7 cm (5' 8)   PainSc:   4   4  PainLoc: Knee Knee    General/Constitutional: No apparent distress: well-nourished and well developed. Eyes: Pupils equal, round with synchronous movement. Lymphatic: No palpable adenopathy. Respiratory: Non-labored breathing. Lungs clear to auscultation. No abnormal breath  sounds. Cardiac: RRR. S1 and S2 noted. No murmurs or gallops. Vascular: No edema, swelling or tenderness, except as noted in detailed exam. Integumentary: No impressive skin lesions present, except as noted in detailed exam. Neuro/Psych: Normal mood and affect, oriented to person, place and time. Musculoskeletal:   Right knee exam    Soft tissue swelling: minimal    Effusion:                   none    Erythema:                 none    Crepitance:               mild    Tenderness:             medial    Alignment:                relative varus    Mediolateral laxity:   medial pseudolaxity    Posterior sag:           negative    Patellar tracking:      Good tracking without evidence of subluxation or tilt    Atrophy:                    No significant atrophy.                                       Quadriceps tone was good.    Range of motion:     0/4/115 degrees Hamstrings are tight.  Imaging  No new imaging  Previous imaging on 07/09/2025:  There is significant narrowing of the medial cartilage space with near bone-on-bone articulation and associated varus alignment. Osteophyte formation is noted. Subchondral sclerosis is noted. No evidence of fracture or dislocation.   Assesment and Plan  Knee DJD  Treatment options were discussed today with the patient. Patient will stop taking NSAIDs, vitamins and supplements 7 days prior to surgery. She will stop Eliquis  3 days prior to surgery. The patient is instructed on the risk and benefits of a right total knee arthroplasty with Dr. Mardee on 08/25/2024. The patient was instructed on the risk and benefits of surgical intervention and wishes proceed at this time.  This document will serve as a surgical history and physical for the patient. The patient will follow-up per standard postop protocol.  They can call the clinic they have any questions, new symptoms develop or symptoms worsen.   The procedure was discussed with the patient, as were  the potential risks (including bleeding, infection, nerve and/or blood vessel injury, persistent or recurrent pain, failure of the repair, progression of arthritis, need for further surgery, blood clots, strokes, heart attacks and/or arhythmias, pneumonia, etc.) and benefits.  The patient  states his understanding and wishes to proceed.   JULIANA VOLPINI CASTANHEIRO DE C COSTA, PA    "

## 2024-08-19 NOTE — Progress Notes (Signed)
" °   [facility] Perioperative Services: Pre-Admission/Anesthesia Testing  Abnormal Testing Notification   Date: 08/19/24  Name: Sandra Phillips MRN:   [id number]  Re: Abnormal ECG noted during PAT appointment   Notified:    Provider Name Provider Role Notification Mode  Hooten, Lynwood SQUIBB, MD Orthopedics (Surgeon)} Routed and/or faxed via RANELL Marea Mayo, MD Vascular Surgery (Surgeon) Routed and/or faxed via RANELL Marylynn Niemann, MD Internal Medicine (PCP) Routed and/or faxed via RANELL Darliss Rogue, MD Cardiology (Consultant) Routed and/or faxed via [facility]   Clinical Information and Notes:  Sandra Phillips is scheduled for IVC FILTER INSERTION on 08/23/2024 followed by an elective RIGHT TOTAL KNEE ARTHROPLASTY on 08/25/2024.  In preparation for her procedure, patient presented to the PAT clinic on the afternoon of 08/19/2024 for preoperative testing.  Preoperative ECG showing sinus bradycardia at a rate of 56 bpm. There are diffuse nonspecific T wave changes observed. Critical QTc interval noted at 600 ms.  Previous ECGs reviewed and QTc interval not noted to be prolonged in the past.  Reviewed medication list.  Patient is not on any medications that would generally cause QTc prolongation.  That said, patient is on a beta-blocker (metoprolol  succinate), which would account for the bradycardia. Denosumab  could cause potentially cause hypocalcemia, however calcium  previously normal. Will plan on rechecking.   Patient is asymptomatic and reports that she feels fine; denies CP, SOB, palpitations, vertiginous symptoms.  ABNORMAL ECG:    Impression and Plan:  Sandra Phillips is scheduled for elective orthopedic surgery in the near future.  Preoperative ECG abnormal with a prolonged QTc of 600 ms.  Tracing was reviewed medicine Christophe, MD), who like myself,  has concerns with patient going under general anesthesia with prolonged QTc interval.  Medicine would like for patient to see  cardiology prior to undergoing elective procedure. Dr. Marylynn has placed the referral and reached out directly to cardiology to secure an urgent appointment. Cardiology to contact patient for expedited consult.  Beta blocker and denosumab  considered as being potentially contributory. Patient contacted and asked to hold her metoprolol  starting today. Patient will hold until advised to restart by either cardiology or Dr. Marylynn. In the interim, additional lab studies ordered today as follows:   BMP - Na 143, K+ 3.9, Ca2+ 9.6 Mg - 2.3 mg/dL  TSH - 8.339 uIU/mL   Will send copy of today's note to both Dr. Mardee and Dr. Marea to make them aware.  In speaking with the cardiology team, patient able to be tomorrow morning.  Appreciate expedited consult by our cardiology colleague, Dr. Darliss, MD. Will follow-up on documentation and plans following consult appointment.  If patient is not cleared to proceed, will follow-up with both Dr. Marea and Dr. Mardee to make them aware. No changes being made to the procedural/OR schedule at this time.  Sandra Pereyra, MSN, APRN, FNP-C, CEN [facility]  Perioperative Services Nurse Practitioner Phone: [phone] Fax: [phone] 08/19/24 3:07 PM "

## 2024-08-20 ENCOUNTER — Ambulatory Visit: Attending: Cardiology | Admitting: Cardiology

## 2024-08-20 VITALS — BP 130/80 | HR 75 | Ht 68.0 in | Wt 204.2 lb

## 2024-08-20 DIAGNOSIS — I10 Essential (primary) hypertension: Secondary | ICD-10-CM

## 2024-08-20 DIAGNOSIS — R9431 Abnormal electrocardiogram [ECG] [EKG]: Secondary | ICD-10-CM

## 2024-08-20 DIAGNOSIS — E7800 Pure hypercholesterolemia, unspecified: Secondary | ICD-10-CM | POA: Diagnosis not present

## 2024-08-20 DIAGNOSIS — Z01810 Encounter for preprocedural cardiovascular examination: Secondary | ICD-10-CM

## 2024-08-20 NOTE — Progress Notes (Signed)
 " Cardiology Office Note:    Date:  08/20/2024   ID:  Sandra Phillips, DOB Nov 24, 1950, MRN [id number]  PCP:  Marylynn Verneita LITTIE, MD   [facility] Providers Cardiologist:  None     Referring MD: Marylynn Verneita LITTIE, MD   Chief Complaint  Patient presents with   Follow-up    Doing fine, abnormal EKG from yesterday.     History of Present Illness:    Sandra Phillips is a 74 y.o. female with a hx of DVT on Eliquis , hypertension, hyperlipidemia presenting due to abnormal ECG.  Patient with right knee pain, diagnosed with knee DJD, right total knee arthroplasty being planned.  Preprocedural EKG yesterday noted to be abnormal with a reported reading of long QT.  She denies chest pain, shortness of breath, any history of heart disease.  Takes Eliquis  due to history of DVTs.  Overall feels well, has no cardiac concerns at this time.  Past Medical History:  Diagnosis Date   Anticoagulated on apixaban     Aortic atherosclerosis    Current long-term use of anticoagulant medication with history of deep venous thrombosis (DVT)    Endometrial cancer (HCC)    History of 2019 novel coronavirus disease (COVID-19) 12/13/2021   History of DVT (deep vein thrombosis)    History of pulmonary embolus (PE) 2022   post knee surgery   History of radiation therapy    Vagina (HDR) 06/26/22-07/10/22- Dr. Lynwood Nasuti   Hyperlipidemia    Hypertension    Osteoarthritis of right knee    Primary osteoarthritis    Sinus tachycardia    Tubular adenoma of colon    Vitamin D  deficiency     Past Surgical History:  Procedure Laterality Date   ABDOMINAL HYSTERECTOMY     COLONOSCOPY WITH PROPOFOL  N/A 01/11/2022   Procedure: COLONOSCOPY WITH PROPOFOL ;  Surgeon: Therisa Bi, MD;  Location: [facility] ENDOSCOPY;  Service: Gastroenterology;  Laterality: N/A;   COLONOSCOPY WITH PROPOFOL  N/A 03/20/2023   Procedure: COLONOSCOPY WITH PROPOFOL ;  Surgeon: Therisa Bi, MD;  Location: [facility] ENDOSCOPY;  Service: Gastroenterology;   Laterality: N/A;   KNEE ARTHROSCOPY Left 06/12/2021   Procedure: LEFT KNEE ARTHROSCOPY WITH DEBRIDEMENT AND REPAIR OF A POSTERIOR MEDIAL ROOT TEAR AND ABRASION CONDOPLASTY;  Surgeon: Edie Norleen PARAS, MD;  Location: [facility] ORS;  Service: Orthopedics;  Laterality: Left;   PULMONARY THROMBECTOMY N/A 07/09/2021   Procedure: PULMONARY THROMBECTOMY;  Surgeon: Marea Selinda RAMAN, MD;  Location: [facility] INVASIVE CV LAB;  Service: Cardiovascular;  Laterality: N/A;   ROBOTIC ASSISTED TOTAL HYSTERECTOMY  03/2022    Current Medications: Active Medications[1]   Allergies:   Penicillins, Alendronate , Oxycodone , Shrimp extract, and Shrimp flavor agent (non-screening)   Social History   Socioeconomic History   Marital status: Married    Spouse name: Not on file   Number of children: Not on file   Years of education: Not on file   Highest education level: Bachelor's degree (e.g., BA, AB, BS)  Occupational History   Not on file  Tobacco Use   Smoking status: Never   Smokeless tobacco: Never  Vaping Use   Vaping status: Never Used  Substance and Sexual Activity   Alcohol use: Yes    Alcohol/week: 1.0 standard drink of alcohol    Types: 1 Glasses of wine per week    Comment: rare   Drug use: Never   Sexual activity: Not Currently  Other Topics Concern   Not on file  Social History Narrative   Married  Social Drivers of Health   Tobacco Use: Low Risk  (08/19/2024)   Received from [facility]   Patient History    Smoking Tobacco Use: Never    Smokeless Tobacco Use: Never    Passive Exposure: Not on file  Financial Resource Strain: Low Risk  (08/19/2024)   Received from [facility]   Overall Financial Resource Strain (CARDIA)    Difficulty of Paying Living Expenses: Not hard at all  Food Insecurity: No Food Insecurity (08/19/2024)   Received from [facility]   Epic    Within the past 12 months, you worried that your food would run out before you  got the money to buy more.: Never true    Within the past 12 months, the food you bought just didn't last and you didn't have money to get more.: Never true  Transportation Needs: No Transportation Needs (08/19/2024)   Received from [facility] - Transportation    In the past 12 months, has lack of transportation kept you from medical appointments or from getting medications?: No    Lack of Transportation (Non-Medical): No  Physical Activity: Insufficiently Active (02/02/2024)   Received from [facility]   Exercise Vital Sign    On average, how many days per week do you engage in moderate to strenuous exercise (like a brisk walk)?: 3 days    On average, how many minutes do you engage in exercise at this level?: 30 min  Stress: No Stress Concern Present (08/09/2024)   [employer] - Occupational Stress Questionnaire    Feeling of Stress: Not at all  Social Connections: Unknown (08/09/2024)   Social Connection and Isolation Panel    Frequency of Communication with Friends and Family: More than three times a week    Frequency of Social Gatherings with Friends and Family: More than three times a week    Attends Religious Services: Not on file    Active Member of Clubs or Organizations: Yes    Attends Banker Meetings: More than 4 times per year    Marital Status: Married  Depression (PHQ2-9): Low Risk (08/11/2024)   Depression (PHQ2-9)    PHQ-2 Score: 0  Alcohol Screen: Low Risk (08/09/2024)   Alcohol Screen    Last Alcohol Screening Score (AUDIT): 2  Housing: Low Risk  (08/19/2024)   Received from [facility]   Epic    In the last 12 months, was there a time when you were not able to pay the mortgage or rent on time?: No    In the past 12 months, how many times have you moved where you were living?: 0    At any time in the past 12 months, were you homeless or living in a shelter (including now)?: No   Utilities: Not At Risk (08/19/2024)   Received from [facility]   Epic    In the past 12 months has the electric, gas, oil, or water company threatened to shut off services in your home?: No  Health Literacy: Adequate Health Literacy (02/02/2024)   B1300 Health Literacy    Frequency of need for help with medical instructions: Never     Family History: The patient's family history includes Breast cancer (age of onset: 107) in her mother; Cancer in her mother; Rheum arthritis in her sister.  ROS:   Please see the history of present illness.  All other systems reviewed and are negative.  EKGs/Labs/Other Studies Reviewed:    The following studies were reviewed today:  EKG Interpretation Date/Time:  Friday August 20 2024 09:41:02 EST Ventricular Rate:  75 PR Interval:  158 QRS Duration:  74 QT Interval:  390 QTC Calculation: 435 R Axis:   46  Text Interpretation: Normal sinus rhythm T wave abnormality, consider anterolateral ischemia Confirmed by Darliss Rogue ([id number]) on 08/20/2024 9:43:34 AM    Recent Labs: 08/09/2024: ALT 16; Hemoglobin 13.5; Platelets 168.0 08/19/2024: BUN 11; Creatinine, Ser 0.76; Magnesium  2.3; Potassium 3.9; Sodium 143; TSH 1.660  Recent Lipid Panel    Component Value Date/Time   CHOL 161 08/07/2023 0920   TRIG 120.0 08/07/2023 0920   HDL 60.00 08/07/2023 0920   CHOLHDL 3 08/07/2023 0920   VLDL 24.0 08/07/2023 0920   LDLCALC 77 08/07/2023 0920   LDLCALC 74 02/03/2023 0858   LDLDIRECT 81.0 08/07/2023 0920     Risk Assessment/Calculations:             Physical Exam:    VS:  BP 130/80 (BP Location: Left Arm, Patient Position: Sitting, Cuff Size: Normal)   Pulse 75   Ht 5' 8 (1.727 m)   Wt 204 lb 3.2 oz (92.6 kg)   SpO2 97%   BMI 31.05 kg/m     Wt Readings from Last 3 Encounters:  08/20/24 204 lb 3.2 oz (92.6 kg)  08/19/24 208 lb 3.2 oz (94.4 kg)  08/11/24 207 lb 6.4 oz (94.1 kg)     GEN:  Well nourished, well  developed in no acute distress HEENT: Normal NECK: No JVD; No carotid bruits CARDIAC: RRR, no murmurs, rubs, gallops RESPIRATORY:  Clear to auscultation without rales, wheezing or rhonchi  ABDOMEN: Soft, non-tender, non-distended MUSCULOSKELETAL:  No edema; right knee discomfort with movement SKIN: Warm and dry NEUROLOGIC:  Alert and oriented x 3 PSYCHIATRIC:  Normal affect   ASSESSMENT:    1. Pre-procedural cardiovascular examination   2. Abnormal EKG   3. Primary hypertension   4. Pure hypercholesterolemia    PLAN:    In order of problems listed above:  Preprocedural exam, right knee arthroplasty being planned.  Denies chest pain or shortness of breath.  Okay for procedure from a cardiac perspective.  Patient does not have prolonged QT as explained in #2 below. EKG abnormality, long QT reported on EKG obtained yesterday.  EKG from yesterday 08/19/2024 reviewed, QT manually measured value was approx 480 ms, clearly lower than reported. QTc calculation on EKG today 435 ms. Machine reported value obviously was erroneous.  Patient does not have prolonged QT. Hypertension, BP controlled.  Continue telmisartan  40 mg daily. Hyperlipidemia, cholesterol controlled.  Continue Crestor  10 mg daily.  Follow-up as needed     Medication Adjustments/Labs and Tests Ordered: Current medicines are reviewed at length with the patient today.  Concerns regarding medicines are outlined above.  Orders Placed This Encounter  Procedures   EKG 12-Lead   No orders of the defined types were placed in this encounter.   Patient Instructions  Medication Instructions:  Your physician recommends that you continue on your current medications as directed. Please refer to the Current Medication list given to you today.   *If you need a refill on your cardiac medications before your next appointment, please call your pharmacy*  Lab Work: No labs ordered today  If you have labs (blood work) drawn today and  your tests are completely normal, you will receive your results only  by: MyChart Message (if you have MyChart) OR A paper copy in the mail If you have any lab test that is abnormal or we need to change your treatment, we will call you to review the results.  Testing/Procedures: No test ordered today   Follow-Up: At [facility], you and your health needs are our priority.  As part of our continuing mission to provide you with exceptional heart care, our providers are all part of one team.  This team includes your primary Cardiologist (physician) and Advanced Practice Providers or APPs (Physician Assistants and Nurse Practitioners) who all work together to provide you with the care you need, when you need it.  Your next appointment:   As needed              Signed, Redell Cave, MD  08/20/2024 10:55 AM    [facility]     [1]  Current Meds  Medication Sig   acetaminophen  (TYLENOL ) 500 MG tablet Take 500 mg by mouth every 6 (six) hours as needed for mild pain (pain score 1-3) or moderate pain (pain score 4-6).   cholecalciferol (VITAMIN D3) 25 MCG (1000 UNIT) tablet Take 4,000 Units by mouth in the morning.   docusate sodium  (COLACE) 100 MG capsule Take 100 mg by mouth at bedtime.   ELIQUIS  5 MG TABS tablet TAKE 1 TABLET TWICE A DAY   metoprolol  succinate (TOPROL -XL) 25 MG 24 hr tablet TAKE 1 TABLET BY MOUTH ONCE DAILY (Patient taking differently: Take 25 mg by mouth at bedtime.)   rosuvastatin  (CRESTOR ) 10 MG tablet Take 1 tablet (10 mg total) by mouth daily. (Patient taking differently: Take 10 mg by mouth at bedtime.)   telmisartan  (MICARDIS ) 40 MG tablet Take 1 tablet (40 mg total) by mouth every morning.   Current Facility-Administered Medications for the 08/20/24 encounter (Office Visit) with Cave Redell, MD  Medication   [START ON 08/25/2024] denosumab  (PROLIA ) injection 60 mg   "

## 2024-08-20 NOTE — Patient Instructions (Signed)

## 2024-08-20 NOTE — Progress Notes (Signed)
" °  Perioperative Services Pre-Admission/Anesthesia Testing    Date: 08/20/24  Name: Sandra Phillips DOB: [birth year] MRN:   [id number]  Re: Preoperative ECG  Clinical Notes:  Patient is scheduled for IVC filter placement followed by knee replacement next week. Preoperative ECG concerning for prolonged QTc. Electrolytes all normal. Reviewed with medicine possibility of true QTc prolongation versus machine reported interval error related to her beta blocker. Given elective procedures in the near future, medicine wished for patient to be seen by cardiology for formal evaluation in preparation for her procedures. STAT referral was placed by medicine.   Patient was seen in expedited consult today (08/20/2024) by Dr. Redell Cave, MD; notes reviewed. Patient with no cardiovascular complaints in clinic. ECG repeated today in the cardiology office and showed NSR with similar diffuse T-wave changes as noted yesterday; captured HR was 75 bpm. QTc was found to be normal at 435 ms on today's tracing. Cardiologist manually reviewed and calculated QTc interval from yesterday's tracing and found interval to by approximately 480 ms. Calculation of normal interval confirmed suspicion of erroneous machine reported value. Patient cleared to proceed with upcoming procedures without the need for further cardiovascular testing.   BP was controlled in the cardiology office today at 130/80 mmHg. As previosuly mentioned, her HR was also controlled in the 70s. Last dose of metoprolol  succinate was on 08/19/2024. Patient remains off beta blocker. She was advised to follow up with Dr. Marylynn regarding the need to restart her beta blocker therapy if ongoing use felt to be medically necessary.   Will update interdisciplinary team members (medicine, vascular, orthopedics, anesthesia) regarding outcomes of cardiology consult. Again, patient cleared to proceed with IVC filter placement and subsequent knee replacement next week.  Appreciate swift and expert consultation from cardiology service line for not only the patient, but also for the aforementioned service lines.   [name], MSN, APRN, FNP-C, CEN [facility]  Perioperative Services Nurse Practitioner Phone: [phone] Fax: [phone] 08/20/24 7:23 PM  NOTE: This note has been prepared using Dragon dictation software. Despite my best ability to proofread, there is always the potential that unintentional transcriptional errors may still occur from this process. "

## 2024-08-21 LAB — URINE CULTURE

## 2024-08-21 LAB — IGE: IgE (Immunoglobulin E), Serum: 116 [IU]/mL (ref 6–495)

## 2024-08-23 ENCOUNTER — Ambulatory Visit
Admission: RE | Admit: 2024-08-23 | Discharge: 2024-08-23 | Disposition: A | Attending: Vascular Surgery | Admitting: Vascular Surgery

## 2024-08-23 ENCOUNTER — Other Ambulatory Visit: Payer: Self-pay

## 2024-08-23 ENCOUNTER — Encounter
Admission: RE | Disposition: A | Discharge status: Home / Self Care | Payer: Self-pay | Source: Home / Self Care | Attending: Vascular Surgery

## 2024-08-23 ENCOUNTER — Encounter: Payer: Self-pay | Admitting: Vascular Surgery

## 2024-08-23 DIAGNOSIS — Z409 Encounter for prophylactic surgery, unspecified: Secondary | ICD-10-CM | POA: Insufficient documentation

## 2024-08-23 DIAGNOSIS — Z7901 Long term (current) use of anticoagulants: Secondary | ICD-10-CM | POA: Insufficient documentation

## 2024-08-23 DIAGNOSIS — I10 Essential (primary) hypertension: Secondary | ICD-10-CM | POA: Insufficient documentation

## 2024-08-23 DIAGNOSIS — I82409 Acute embolism and thrombosis of unspecified deep veins of unspecified lower extremity: Secondary | ICD-10-CM

## 2024-08-23 DIAGNOSIS — Z79899 Other long term (current) drug therapy: Secondary | ICD-10-CM | POA: Insufficient documentation

## 2024-08-23 DIAGNOSIS — Z86718 Personal history of other venous thrombosis and embolism: Secondary | ICD-10-CM | POA: Insufficient documentation

## 2024-08-23 MED ORDER — FENTANYL CITRATE (PF) 100 MCG/2ML IJ SOLN
INTRAMUSCULAR | Status: DC | PRN
  Administered 2024-08-23: 50 ug via INTRAVENOUS

## 2024-08-23 MED ORDER — CEFAZOLIN SODIUM-DEXTROSE 2-4 GM/100ML-% IV SOLN
INTRAVENOUS | Status: AC
  Filled 2024-08-23: qty 100

## 2024-08-23 MED ORDER — ONDANSETRON HCL 4 MG/2ML IJ SOLN: 4.0000 mg | Freq: Four times a day (QID) | INTRAMUSCULAR | Status: DC | PRN

## 2024-08-23 MED ORDER — MIDAZOLAM HCL (PF) 2 MG/2ML IJ SOLN
INTRAMUSCULAR | Status: DC | PRN
  Administered 2024-08-23: 2 mg via INTRAVENOUS

## 2024-08-23 MED ORDER — SODIUM CHLORIDE 0.9 % IV SOLN: INTRAVENOUS | Status: DC

## 2024-08-23 MED ORDER — FAMOTIDINE 20 MG PO TABS
ORAL_TABLET | ORAL | Status: AC
  Filled 2024-08-23: qty 2

## 2024-08-23 MED ORDER — HEPARIN SODIUM (PORCINE) 1000 UNIT/ML IJ SOLN
INTRAMUSCULAR | Status: AC
  Filled 2024-08-23: qty 10

## 2024-08-23 MED ORDER — CEFAZOLIN SODIUM-DEXTROSE 2-4 GM/100ML-% IV SOLN
2.0000 g | INTRAVENOUS | Status: AC
  Administered 2024-08-23: 2 g via INTRAVENOUS

## 2024-08-23 MED ORDER — LIDOCAINE-EPINEPHRINE (PF) 1 %-1:200000 IJ SOLN
INTRAMUSCULAR | Status: DC | PRN
  Administered 2024-08-23: 10 mL

## 2024-08-23 MED ORDER — METHYLPREDNISOLONE SODIUM SUCC 125 MG IJ SOLR
125.0000 mg | Freq: Once | INTRAMUSCULAR | Status: AC | PRN
  Administered 2024-08-23: 125 mg via INTRAVENOUS

## 2024-08-23 MED ORDER — DIPHENHYDRAMINE HCL 50 MG/ML IJ SOLN
50.0000 mg | Freq: Once | INTRAMUSCULAR | Status: AC | PRN
  Administered 2024-08-23: 50 mg via INTRAVENOUS

## 2024-08-23 MED ORDER — HEPARIN (PORCINE) IN NACL 1000-0.9 UT/500ML-% IV SOLN
INTRAVENOUS | Status: DC | PRN
  Administered 2024-08-23: 500 mL

## 2024-08-23 MED ORDER — MIDAZOLAM HCL 2 MG/ML PO SYRP: 8.0000 mg | ORAL_SOLUTION | Freq: Once | ORAL | Status: DC | PRN

## 2024-08-23 MED ORDER — HYDROMORPHONE HCL 1 MG/ML IJ SOLN: 1.0000 mg | Freq: Once | INTRAMUSCULAR | Status: DC | PRN

## 2024-08-23 MED ORDER — IODIXANOL 320 MG/ML IV SOLN
INTRAVENOUS | Status: DC | PRN
  Administered 2024-08-23: 15 mL

## 2024-08-23 MED ORDER — DIPHENHYDRAMINE HCL 50 MG/ML IJ SOLN
INTRAMUSCULAR | Status: AC
  Filled 2024-08-23: qty 1

## 2024-08-23 MED ORDER — FAMOTIDINE 20 MG PO TABS
40.0000 mg | ORAL_TABLET | Freq: Once | ORAL | Status: AC | PRN
  Administered 2024-08-23: 40 mg via ORAL

## 2024-08-23 MED ORDER — FENTANYL CITRATE (PF) 100 MCG/2ML IJ SOLN
INTRAMUSCULAR | Status: AC
  Filled 2024-08-23: qty 2

## 2024-08-23 MED ORDER — MIDAZOLAM HCL 2 MG/2ML IJ SOLN
INTRAMUSCULAR | Status: AC
  Filled 2024-08-23: qty 2

## 2024-08-23 MED ORDER — METHYLPREDNISOLONE SODIUM SUCC 125 MG IJ SOLR
INTRAMUSCULAR | Status: AC
  Filled 2024-08-23: qty 2

## 2024-08-23 NOTE — H&P (Signed)
 " [facility]ASC[facility] VEIN SPECIALISTS Admission History & Physical  MRN : [id number]  Sandra Phillips is a 74 y.o. (1951/04/08) female who presents with chief complaint of No chief complaint on file. SABRA  History of Present Illness: Patient presents for her IVC filter placement preliminary to orthopedic surgery.  No new complaints or issues since her office visit in December.  Current Facility-Administered Medications  Medication Dose Route Frequency Provider Last Rate Last Admin   0.9 %  sodium chloride  infusion   Intravenous Continuous Brown, Fallon E, NP 75 mL/hr at 08/23/24 0817 New Bag at 08/23/24 0817   ceFAZolin  (ANCEF ) IVPB 2g/100 mL premix  2 g Intravenous 30 min Pre-Op Brown, Fallon E, NP       diphenhydrAMINE  (BENADRYL ) injection 50 mg  50 mg Intravenous Once PRN Brown, Fallon E, NP       famotidine  (PEPCID ) tablet 40 mg  40 mg Oral Once PRN Brown, Fallon E, NP       HYDROmorphone  (DILAUDID ) injection 1 mg  1 mg Intravenous Once PRN Brown, Fallon E, NP       methylPREDNISolone  sodium succinate (SOLU-MEDROL ) 125 mg/2 mL injection 125 mg  125 mg Intravenous Once PRN Brown, Fallon E, NP       midazolam  (VERSED ) 2 MG/ML syrup 8 mg  8 mg Oral Once PRN Brown, Fallon E, NP       ondansetron  (ZOFRAN ) injection 4 mg  4 mg Intravenous Q6H PRN Brown, Fallon E, NP        Past Medical History:  Diagnosis Date   Anticoagulated on apixaban     Aortic atherosclerosis    Current long-term use of anticoagulant medication with history of deep venous thrombosis (DVT)    Endometrial cancer (HCC)    History of 2019 novel coronavirus disease (COVID-19) 12/13/2021   History of DVT (deep vein thrombosis)    History of pulmonary embolus (PE) 2022   post knee surgery   History of radiation therapy    Vagina (HDR) 06/26/22-07/10/22- Dr. Lynwood Nasuti   Hyperlipidemia    Hypertension    Osteoarthritis of right knee    Primary osteoarthritis    Sinus tachycardia    Tubular adenoma of colon    Vitamin D   deficiency     Past Surgical History:  Procedure Laterality Date   ABDOMINAL HYSTERECTOMY     COLONOSCOPY WITH PROPOFOL  N/A 01/11/2022   Procedure: COLONOSCOPY WITH PROPOFOL ;  Surgeon: Therisa Bi, MD;  Location: [facility] END[facility];  Service: Gastroenterology;  Laterality: N/A;   COLONOSCOPY WITH PROPOFOL  N/A 03/20/2023   Procedure: COLONOSCOPY WITH PROPOFOL ;  Surgeon: Therisa Bi, MD;  Location: [facility] ENDOSCOPY;  Service: Gastroenterology;  Laterality: N/A;   KNEE ARTHROSCOPY Left 06/12/2021   Procedure: LEFT KNEE ARTHROSCOPY WITH DEBRIDEMENT AND REPAIR OF A POSTERIOR MEDIAL ROOT TEAR AND ABRASION CONDOPLASTY;  Surgeon: Edie Norleen PARAS, MD;  Location: [facility] ORS;  Service: Orthopedics;  Laterality: Left;   PULMONARY THROMBECTOMY N/A 07/09/2021   Procedure: PULMONARY THROMBECTOMY;  Surgeon: Marea Selinda RAMAN, MD;  Location: [facility] INVASIVE CV LAB;  Service: Cardiovascular;  Laterality: N/A;   ROBOTIC ASSISTED TOTAL HYSTERECTOMY  03/2022     Social History[1]   Family History  Problem Relation Age of Onset   Breast cancer Mother 65       pt also has bone and bladder   Cancer Mother    Rheum arthritis Sister     Allergies[2]   REVIEW OF SYSTEMS (Negative unless checked)   Constitutional: [] Weight loss  []   Fever  [] Chills Cardiac: [] Chest pain   [] Chest pressure   [] Palpitations   [] Shortness of breath when laying flat   [] Shortness of breath at rest   [] Shortness of breath with exertion. Vascular:  [] Pain in legs with walking   [] Pain in legs at rest   [] Pain in legs when laying flat   [] Claudication   [] Pain in feet when walking  [] Pain in feet at rest  [] Pain in feet when laying flat   [x] History of DVT   [x] Phlebitis   [] Swelling in legs   [] Varicose veins   [] Non-healing ulcers Pulmonary:   [] Uses home oxygen   [] Productive cough   [] Hemoptysis   [] Wheeze  [] COPD   [] Asthma Neurologic:  [] Dizziness  [] Blackouts   [] Seizures   [] History of stroke   [] History of TIA  [] Aphasia   [] Temporary  blindness   [] Dysphagia   [] Weakness or numbness in arms   [] Weakness or numbness in legs Musculoskeletal:  [] Arthritis   [] Joint swelling   [] Joint pain   [] Low back pain Hematologic:  [] Easy bruising  [] Easy bleeding   [] Hypercoagulable state   [x] Anemic   Gastrointestinal:  [] Blood in stool   [] Vomiting blood  [] Gastroesophageal reflux/heartburn   [] Abdominal pain Genitourinary:  [] Chronic kidney disease   [] Difficult urination  [] Frequent urination  [] Burning with urination   [] Hematuria Skin:  [] Rashes   [] Ulcers   [] Wounds Psychological:  [] History of anxiety   []  History of major depression.  Physical Examination  Vitals:   08/23/24 0816  BP: (!) 152/84  Pulse: 64  Resp: 18  Temp: 98.1 F (36.7 C)  TempSrc: Temporal  SpO2: 97%  Weight: 92.5 kg  Height: 5' 8 (1.727 m)   Body mass index is 31.02 kg/m. Gen: WD/WN, NAD Head: [place]/AT, No temporalis wasting.  Ear/Nose/Throat: Hearing grossly intact, nares w/o erythema or drainage, oropharynx w/o Erythema/Exudate,  Eyes: Conjunctiva clear, sclera non-icteric Neck: Trachea midline.  No JVD.  Pulmonary:  Good air movement, respirations not labored, no use of accessory muscles.  Cardiac: RRR, normal S1, S2. Vascular:  Vessel Right Left  Radial Palpable Palpable           Musculoskeletal: M/S 5/5 throughout.  Extremities without ischemic changes.  No deformity or atrophy.  Neurologic: Sensation grossly intact in extremities.  Symmetrical.  Speech is fluent. Motor exam as listed above. Psychiatric: Judgment intact, Mood & affect appropriate for pt's clinical situation. Dermatologic: No rashes or ulcers noted.  No cellulitis or open wounds.      CBC Lab Results  Component Value Date   WBC 5.2 08/09/2024   HGB 13.5 08/09/2024   HCT 38.5 08/09/2024   MCV 92.8 08/09/2024   PLT 168.0 08/09/2024    BMET    Component Value Date/Time   NA 143 08/19/2024 1315   NA 141 09/23/2012 0735   K 3.9 08/19/2024 1315   K 3.9  09/23/2012 0735   CL 105 08/19/2024 1315   CL 106 09/23/2012 0735   CO2 26 08/19/2024 1315   CO2 25 09/23/2012 0735   GLUCOSE 103 (H) 08/19/2024 1315   GLUCOSE 142 (H) 09/23/2012 0735   BUN 11 08/19/2024 1315   BUN 11 09/23/2012 0735   CREATININE 0.76 08/19/2024 1315   CREATININE 0.98 09/23/2012 0735   CALCIUM  9.6 08/19/2024 1315   CALCIUM  9.0 09/23/2012 0735   GFRNONAA >60 08/19/2024 1315   GFRNONAA >60 09/23/2012 0735   GFRAA >60 09/23/2012 0735   Estimated Creatinine Clearance: 76.7 mL/min (by  C-G formula based on SCr of 0.76 mg/dL).  COAG Lab Results  Component Value Date   INR 1.1 07/07/2021    Radiology No results found.   Assessment/Plan Chronic pulmonary embolism She was at elevated risk for recurrent venous thromboembolism perioperatively due to prior pulmonary embolism, anticipated immobility, and planned anticoagulation interruption for surgery. Prophylactic temporary IVC filter placement was recommended to prevent pulmonary embolism during anticoagulation hold. - Planned temporary IVC filter placement prior to orthopedic surgery. - Scheduled IVC filter placement for the week prior to or two days before surgery (February 4). - Arranged follow-up 4-6 weeks postoperatively, including lower extremity ultrasound to assess for DVT prior to filter removal. - Planned removal of the IVC filter if postoperative ultrasound is negative for DVT. - Coordinated perioperative anticoagulation management with orthopedic surgery: Eliquis  to be discontinued two days prior to surgery and resumed postoperatively, typically on Thursday if surgery is Wednesday after surgery if no excessive bleeding.   Essential hypertension blood pressure control important in reducing the progression of atherosclerotic disease. On appropriate oral medications.   Selinda Gu, MD  08/23/2024 9:11 AM        [1]  Social History Tobacco Use   Smoking status: Never   Smokeless tobacco: Never   Vaping Use   Vaping status: Never Used  Substance Use Topics   Alcohol use: Yes    Alcohol/week: 1.0 standard drink of alcohol    Types: 1 Glasses of wine per week    Comment: rare   Drug use: Never  [2]  Allergies Allergen Reactions   Penicillins     Unknown childhood reaction    Alendronate  Itching   Oxycodone  Nausea And Vomiting    Dizzy   Shrimp Extract Swelling    Shrimp     Shrimp Flavor Agent (Non-Screening) Swelling    Shrimp   "

## 2024-08-23 NOTE — Op Note (Signed)
 [facility] VASCULAR SURGERY   OPERATIVE NOTE    PRE-OPERATIVE DIAGNOSIS: PE, upcoming major orthopedic surgery  POST-OPERATIVE DIAGNOSIS: same as above  PROCEDURE: 1.   Ultrasound guidance for vascular access to the right femoral vein 2.   Catheter placement into the inferior vena cava 3.   Inferior venacavogram 4.   Placement of a Option Elite IVC filter  SURGEON: Selinda Gu, MD  ASSISTANT(S): None  ANESTHESIA: local with Moderate Conscious Sedation for approximately 17 minutes using 2 mg of Versed  and 50 mcg of Fentanyl   ESTIMATED BLOOD LOSS: minimal  CONTRAST: 15 cc  FLUORO TIME: less than one minute  FINDING(S): 1.  Patent IVC  SPECIMEN(S):  none  INDICATIONS:   Sandra Phillips is a 74 y.o. female who presents with a PE and major orthopedic surgery coming up.  Inferior vena cava filter is indicated for this reason.  Risks and benefits including filter thrombosis, migration, fracture, bleeding, and infection were all discussed.  We discussed that all IVC filters that we place can be removed if desired from the patient once the need for the filter has passed.    DESCRIPTION: After obtaining full informed written consent, the patient was brought back to the vascular suite. The skin was sterilely prepped and draped in a sterile surgical field was created. Moderate conscious sedation was administered during a face to face encounter with the patient throughout the procedure with my supervision of the RN administering medicines and monitoring the patient's vital signs, pulse oximetry, telemetry and mental status throughout from the start of the procedure until the patient was taken to the recovery room. The right femoral vein was accessed under direct ultrasound guidance without difficulty with a Seldinger needle and a J-wire was then placed. After skin nick and dilatation, the delivery sheath was placed into the inferior vena cava and an inferior venacavogram was performed.  This demonstrated a patent IVC with the level of the renal veins at L1.  The filter was then deployed into the inferior vena cava at the level of L2 just below the renal veins. The delivery sheath was then removed. Pressure was held. Sterile dressings were placed. The patient tolerated the procedure well and was taken to the recovery room in stable condition.  COMPLICATIONS: None  CONDITION: Stable  Selinda Gu  08/23/2024, 9:47 AM   This note was created with Dragon Medical transcription system. Any errors in dictation are purely unintentional.

## 2024-08-24 ENCOUNTER — Encounter: Payer: Self-pay | Admitting: Orthopedic Surgery

## 2024-08-25 ENCOUNTER — Encounter: Payer: Self-pay | Admitting: Orthopedic Surgery

## 2024-08-25 ENCOUNTER — Observation Stay

## 2024-08-25 ENCOUNTER — Encounter: Payer: Self-pay | Admitting: Urgent Care

## 2024-08-25 ENCOUNTER — Encounter: Admission: RE | Payer: Self-pay | Source: Home / Self Care

## 2024-08-25 ENCOUNTER — Observation Stay
Admission: RE | Admit: 2024-08-25 | Discharge: 2024-08-26 | Disposition: A | Attending: Orthopedic Surgery | Admitting: Orthopedic Surgery

## 2024-08-25 ENCOUNTER — Other Ambulatory Visit: Payer: Self-pay

## 2024-08-25 DIAGNOSIS — Z96651 Presence of right artificial knee joint: Secondary | ICD-10-CM

## 2024-08-25 DIAGNOSIS — M1711 Unilateral primary osteoarthritis, right knee: Principal | ICD-10-CM

## 2024-08-25 DIAGNOSIS — Z880 Allergy status to penicillin: Secondary | ICD-10-CM

## 2024-08-25 LAB — POCT PREGNANCY, URINE: Preg Test, Ur: NEGATIVE

## 2024-08-25 MED ORDER — BUPIVACAINE HCL (PF) 0.25 % IJ SOLN
INTRAMUSCULAR | Status: DC | PRN
  Administered 2024-08-25: 60 mL

## 2024-08-25 MED ORDER — HYDROMORPHONE HCL 1 MG/ML IJ SOLN: 0.5000 mg | INTRAMUSCULAR | Status: DC | PRN

## 2024-08-25 MED ORDER — METOCLOPRAMIDE HCL 10 MG PO TABS
10.0000 mg | ORAL_TABLET | Freq: Three times a day (TID) | ORAL | Status: DC
  Administered 2024-08-25 – 2024-08-26 (×3): 10 mg via ORAL
  Filled 2024-08-25 (×3): qty 1

## 2024-08-25 MED ORDER — GABAPENTIN 300 MG PO CAPS
300.0000 mg | ORAL_CAPSULE | Freq: Once | ORAL | Status: AC
  Administered 2024-08-25: 300 mg via ORAL

## 2024-08-25 MED ORDER — CEFAZOLIN SODIUM-DEXTROSE 2-4 GM/100ML-% IV SOLN
2.0000 g | Freq: Four times a day (QID) | INTRAVENOUS | Status: AC
  Administered 2024-08-25 (×2): 2 g via INTRAVENOUS
  Filled 2024-08-25 (×2): qty 100

## 2024-08-25 MED ORDER — MIDAZOLAM HCL 2 MG/2ML IJ SOLN
INTRAMUSCULAR | Status: AC
  Filled 2024-08-25: qty 2

## 2024-08-25 MED ORDER — PHENYLEPHRINE HCL-NACL 20-0.9 MG/250ML-% IV SOLN
INTRAVENOUS | Status: DC | PRN
  Administered 2024-08-25: 25 ug/min via INTRAVENOUS

## 2024-08-25 MED ORDER — EPHEDRINE SULFATE-NACL 50-0.9 MG/10ML-% IV SOSY
PREFILLED_SYRINGE | INTRAVENOUS | Status: DC | PRN
  Administered 2024-08-25: 5 mg via INTRAVENOUS

## 2024-08-25 MED ORDER — METOPROLOL SUCCINATE ER 25 MG PO TB24
25.0000 mg | ORAL_TABLET | Freq: Every day | ORAL | Status: DC
  Administered 2024-08-25: 25 mg via ORAL
  Filled 2024-08-25: qty 1

## 2024-08-25 MED ORDER — OXYCODONE HCL 5 MG PO TABS: 5.0000 mg | ORAL_TABLET | Freq: Once | ORAL | Status: DC | PRN

## 2024-08-25 MED ORDER — TRANEXAMIC ACID-NACL 1000-0.7 MG/100ML-% IV SOLN
1000.0000 mg | Freq: Once | INTRAVENOUS | Status: AC
  Administered 2024-08-25: 1000 mg via INTRAVENOUS

## 2024-08-25 MED ORDER — SURGIPHOR WOUND IRRIGATION SYSTEM - OPTIME
TOPICAL | Status: DC | PRN
  Administered 2024-08-25: 450 mL

## 2024-08-25 MED ORDER — TRANEXAMIC ACID-NACL 1000-0.7 MG/100ML-% IV SOLN
INTRAVENOUS | Status: AC
  Filled 2024-08-25: qty 100

## 2024-08-25 MED ORDER — APIXABAN 2.5 MG PO TABS
5.0000 mg | ORAL_TABLET | Freq: Two times a day (BID) | ORAL | Status: DC
  Administered 2024-08-26: 5 mg via ORAL
  Filled 2024-08-25: qty 2

## 2024-08-25 MED ORDER — CEFAZOLIN SODIUM-DEXTROSE 2-4 GM/100ML-% IV SOLN
INTRAVENOUS | Status: AC
  Filled 2024-08-25: qty 100

## 2024-08-25 MED ORDER — CELECOXIB 200 MG PO CAPS
400.0000 mg | ORAL_CAPSULE | Freq: Once | ORAL | Status: AC
  Administered 2024-08-25: 400 mg via ORAL

## 2024-08-25 MED ORDER — CHLORHEXIDINE GLUCONATE 4 % EX SOLN: 60.0000 mL | Freq: Once | CUTANEOUS | Status: DC

## 2024-08-25 MED ORDER — SODIUM CHLORIDE 0.9 % IV SOLN: INTRAVENOUS | Status: DC

## 2024-08-25 MED ORDER — CELECOXIB 200 MG PO CAPS
ORAL_CAPSULE | ORAL | Status: AC
  Filled 2024-08-25: qty 2

## 2024-08-25 MED ORDER — TRAMADOL HCL 50 MG PO TABS
50.0000 mg | ORAL_TABLET | ORAL | Status: DC | PRN
  Administered 2024-08-25 – 2024-08-26 (×2): 50 mg via ORAL
  Filled 2024-08-25 (×2): qty 1

## 2024-08-25 MED ORDER — SODIUM CHLORIDE 0.9 % IV SOLN
INTRAVENOUS | Status: DC | PRN
  Administered 2024-08-25: 60 mL

## 2024-08-25 MED ORDER — BUPIVACAINE HCL (PF) 0.5 % IJ SOLN
INTRAMUSCULAR | Status: AC
  Filled 2024-08-25: qty 10

## 2024-08-25 MED ORDER — DEXAMETHASONE SOD PHOSPHATE PF 10 MG/ML IJ SOLN
INTRAMUSCULAR | Status: AC
  Filled 2024-08-25: qty 1

## 2024-08-25 MED ORDER — ACETAMINOPHEN 10 MG/ML IV SOLN
INTRAVENOUS | Status: AC
  Filled 2024-08-25: qty 100

## 2024-08-25 MED ORDER — BUPIVACAINE HCL (PF) 0.5 % IJ SOLN
INTRAMUSCULAR | Status: DC | PRN
  Administered 2024-08-25: 3 mL via INTRATHECAL

## 2024-08-25 MED ORDER — CHLORHEXIDINE GLUCONATE 0.12 % MT SOLN
15.0000 mL | Freq: Once | OROMUCOSAL | Status: AC
  Administered 2024-08-25: 15 mL via OROMUCOSAL

## 2024-08-25 MED ORDER — PHENYLEPHRINE HCL-NACL 20-0.9 MG/250ML-% IV SOLN
INTRAVENOUS | Status: AC
  Filled 2024-08-25: qty 250

## 2024-08-25 MED ORDER — FLEET ENEMA RE ENEM: 1.0000 | ENEMA | Freq: Once | RECTAL | Status: DC | PRN

## 2024-08-25 MED ORDER — FENTANYL CITRATE (PF) 100 MCG/2ML IJ SOLN: 25.0000 ug | INTRAMUSCULAR | Status: DC | PRN

## 2024-08-25 MED ORDER — PHENOL 1.4 % MT LIQD: 1.0000 | OROMUCOSAL | Status: DC | PRN

## 2024-08-25 MED ORDER — EPHEDRINE 5 MG/ML INJ
INTRAVENOUS | Status: AC
  Filled 2024-08-25: qty 5

## 2024-08-25 MED ORDER — LIDOCAINE HCL (CARDIAC) PF 100 MG/5ML IV SOSY
PREFILLED_SYRINGE | INTRAVENOUS | Status: DC | PRN
  Administered 2024-08-25: 60 mg via INTRAVENOUS

## 2024-08-25 MED ORDER — PANTOPRAZOLE SODIUM 40 MG PO TBEC
40.0000 mg | DELAYED_RELEASE_TABLET | Freq: Two times a day (BID) | ORAL | Status: DC
  Administered 2024-08-25 – 2024-08-26 (×3): 40 mg via ORAL
  Filled 2024-08-25 (×3): qty 1

## 2024-08-25 MED ORDER — MIDAZOLAM HCL 5 MG/5ML IJ SOLN
INTRAMUSCULAR | Status: DC | PRN
  Administered 2024-08-25: 2 mg via INTRAVENOUS

## 2024-08-25 MED ORDER — ONDANSETRON HCL 4 MG PO TABS: 4.0000 mg | ORAL_TABLET | Freq: Four times a day (QID) | ORAL | Status: DC | PRN

## 2024-08-25 MED ORDER — ORAL CARE MOUTH RINSE: 15.0000 mL | Freq: Once | OROMUCOSAL | Status: AC

## 2024-08-25 MED ORDER — PROPOFOL 1000 MG/100ML IV EMUL
INTRAVENOUS | Status: AC
  Filled 2024-08-25: qty 100

## 2024-08-25 MED ORDER — FENTANYL CITRATE (PF) 100 MCG/2ML IJ SOLN
INTRAMUSCULAR | Status: AC
  Filled 2024-08-25: qty 2

## 2024-08-25 MED ORDER — MAGNESIUM HYDROXIDE 400 MG/5ML PO SUSP
30.0000 mL | Freq: Every day | ORAL | Status: DC
  Administered 2024-08-26: 30 mL via ORAL
  Filled 2024-08-25: qty 30

## 2024-08-25 MED ORDER — ONDANSETRON HCL 4 MG/2ML IJ SOLN
INTRAMUSCULAR | Status: DC | PRN
  Administered 2024-08-25: 4 mg via INTRAVENOUS

## 2024-08-25 MED ORDER — CHLORHEXIDINE GLUCONATE 0.12 % MT SOLN
OROMUCOSAL | Status: AC
  Filled 2024-08-25: qty 15

## 2024-08-25 MED ORDER — PROPOFOL 10 MG/ML IV BOLUS
INTRAVENOUS | Status: AC
  Filled 2024-08-25: qty 20

## 2024-08-25 MED ORDER — BISACODYL 10 MG RE SUPP: 10.0000 mg | Freq: Every day | RECTAL | Status: DC | PRN

## 2024-08-25 MED ORDER — SENNOSIDES-DOCUSATE SODIUM 8.6-50 MG PO TABS
1.0000 | ORAL_TABLET | Freq: Two times a day (BID) | ORAL | Status: DC
  Administered 2024-08-25 – 2024-08-26 (×3): 1 via ORAL
  Filled 2024-08-25 (×3): qty 1

## 2024-08-25 MED ORDER — MENTHOL 3 MG MT LOZG: 1.0000 | LOZENGE | OROMUCOSAL | Status: DC | PRN

## 2024-08-25 MED ORDER — ACETAMINOPHEN 325 MG PO TABS: 325.0000 mg | ORAL_TABLET | Freq: Four times a day (QID) | ORAL | Status: DC | PRN

## 2024-08-25 MED ORDER — DEXAMETHASONE SOD PHOSPHATE PF 10 MG/ML IJ SOLN
8.0000 mg | Freq: Once | INTRAMUSCULAR | Status: AC
  Administered 2024-08-25: 8 mg via INTRAVENOUS

## 2024-08-25 MED ORDER — FERROUS SULFATE 325 (65 FE) MG PO TABS
325.0000 mg | ORAL_TABLET | Freq: Two times a day (BID) | ORAL | Status: DC
  Administered 2024-08-25 – 2024-08-26 (×2): 325 mg via ORAL
  Filled 2024-08-25 (×2): qty 1

## 2024-08-25 MED ORDER — ENSURE PRE-SURGERY PO LIQD
296.0000 mL | Freq: Once | ORAL | Status: DC
  Filled 2024-08-25: qty 296

## 2024-08-25 MED ORDER — LIDOCAINE HCL (PF) 2 % IJ SOLN
INTRAMUSCULAR | Status: AC
  Filled 2024-08-25: qty 5

## 2024-08-25 MED ORDER — ALUM & MAG HYDROXIDE-SIMETH 200-200-20 MG/5ML PO SUSP: 30.0000 mL | ORAL | Status: DC | PRN

## 2024-08-25 MED ORDER — ACETAMINOPHEN 10 MG/ML IV SOLN
INTRAVENOUS | Status: DC | PRN
  Administered 2024-08-25: 1000 mg via INTRAVENOUS

## 2024-08-25 MED ORDER — OXYCODONE HCL 5 MG PO TABS: 5.0000 mg | ORAL_TABLET | ORAL | Status: DC | PRN

## 2024-08-25 MED ORDER — OXYCODONE HCL 5 MG PO TABS: 10.0000 mg | ORAL_TABLET | ORAL | Status: DC | PRN

## 2024-08-25 MED ORDER — PROPOFOL 500 MG/50ML IV EMUL
INTRAVENOUS | Status: DC | PRN
  Administered 2024-08-25: 120 ug/kg/min via INTRAVENOUS

## 2024-08-25 MED ORDER — GABAPENTIN 300 MG PO CAPS
ORAL_CAPSULE | ORAL | Status: AC
  Filled 2024-08-25: qty 1

## 2024-08-25 MED ORDER — LACTATED RINGERS IV SOLN: INTRAVENOUS | Status: DC

## 2024-08-25 MED ORDER — ACETAMINOPHEN 10 MG/ML IV SOLN
1000.0000 mg | Freq: Four times a day (QID) | INTRAVENOUS | Status: DC
  Administered 2024-08-25 – 2024-08-26 (×3): 1000 mg via INTRAVENOUS
  Filled 2024-08-25 (×3): qty 100

## 2024-08-25 MED ORDER — IRBESARTAN 150 MG PO TABS
150.0000 mg | ORAL_TABLET | Freq: Every day | ORAL | Status: DC
  Administered 2024-08-25: 150 mg via ORAL
  Filled 2024-08-25 (×2): qty 1

## 2024-08-25 MED ORDER — ONDANSETRON HCL 4 MG/2ML IJ SOLN: 4.0000 mg | Freq: Four times a day (QID) | INTRAMUSCULAR | Status: DC | PRN

## 2024-08-25 MED ORDER — DIPHENHYDRAMINE HCL 12.5 MG/5ML PO ELIX: 12.5000 mg | ORAL_SOLUTION | ORAL | Status: DC | PRN

## 2024-08-25 MED ORDER — SODIUM CHLORIDE 0.9 % IR SOLN
Status: DC | PRN
  Administered 2024-08-25: 3000 mL

## 2024-08-25 MED ORDER — CELECOXIB 200 MG PO CAPS
200.0000 mg | ORAL_CAPSULE | Freq: Two times a day (BID) | ORAL | Status: DC
  Administered 2024-08-25 – 2024-08-26 (×2): 200 mg via ORAL
  Filled 2024-08-25 (×2): qty 1

## 2024-08-25 MED ORDER — ACETAMINOPHEN 10 MG/ML IV SOLN: 1000.0000 mg | Freq: Once | INTRAVENOUS | Status: DC | PRN

## 2024-08-25 MED ORDER — DROPERIDOL 2.5 MG/ML IJ SOLN: 0.6250 mg | Freq: Once | INTRAMUSCULAR | Status: DC | PRN

## 2024-08-25 MED ORDER — TRANEXAMIC ACID-NACL 1000-0.7 MG/100ML-% IV SOLN
1000.0000 mg | INTRAVENOUS | Status: AC
  Administered 2024-08-25: 1000 mg via INTRAVENOUS

## 2024-08-25 MED ORDER — CEFAZOLIN SODIUM-DEXTROSE 2-4 GM/100ML-% IV SOLN
2.0000 g | INTRAVENOUS | Status: AC
  Administered 2024-08-25: 2 g via INTRAVENOUS

## 2024-08-25 MED ORDER — FENTANYL CITRATE (PF) 100 MCG/2ML IJ SOLN
INTRAMUSCULAR | Status: DC | PRN
  Administered 2024-08-25 (×4): 25 ug via INTRAVENOUS

## 2024-08-25 MED ORDER — OXYCODONE HCL 5 MG/5ML PO SOLN: 5.0000 mg | Freq: Once | ORAL | Status: DC | PRN

## 2024-08-25 NOTE — TOC Initial Note (Signed)
 Transition of Care [facility]) - Initial/Assessment Note    Patient Details  Name: Sandra Phillips MRN: [id number] Date of Birth: September 20, 1950  [facility] CM/SW Contact:    Xitlali Kastens L Kawena Lyday, LCSW Phone Number: 08/25/2024, 4:27 PM  Clinical Narrative:                  Home Health pre-arranged by the surgeons office with [facility]. DME ordered through [employer]. DME will be processed if patient is eligible.        Patient Goals and CMS Choice            Expected Discharge Plan and Services                                              Prior Living Arrangements/Services                       Activities of Daily Living   ADL Screening (condition at time of admission) Independently performs ADLs?: Yes (appropriate for developmental age) Is the patient deaf or have difficulty hearing?: No Does the patient have difficulty seeing, even when wearing glasses/contacts?: No Does the patient have difficulty concentrating, remembering, or making decisions?: No  Permission Sought/Granted                  Emotional Assessment              Admission diagnosis:  Primary osteoarthritis of right knee [M17.11] History of total knee arthroplasty, right [Z96.651] Patient Active Problem List   Diagnosis Date Noted   History of total knee arthroplasty, right 08/25/2024   Tubular adenoma of colon 02/03/2023   Chronic pulmonary embolism (HCC) 08/02/2022   Vitamin D  deficiency 05/08/2022   Essential hypertension 04/29/2022   Uterine mass 04/11/2022   Osteoporosis 04/05/2022   Endometrial cancer (HCC) 03/13/2022   Encounter for preventive health examination 01/05/2022   Aortic atherosclerosis 12/08/2021   Obesity (BMI 35.0-39.9 without comorbidity) 12/08/2021   History of pulmonary embolism 07/07/2021   Sinus tachycardia 07/07/2021   Primary osteoarthritis 07/07/2021   History of deep venous thrombosis (DVT) of distal vein of left lower  extremity 07/07/2021   Primary osteoarthritis of left knee 06/11/2021   PCP:  Marylynn Verneita LITTIE, MD Pharmacy:   [facility] - [place], [place] - [address] [place] [address] Phone: [phone] Fax: [phone]  [facility] [address] [place] [address] Phone: [phone] Fax: [phone]     Social Drivers of Health (SDOH) Social History: SDOH Screenings   Food Insecurity: No Food Insecurity (08/25/2024)  Housing: Low Risk (08/25/2024)  Transportation Needs: No Transportation Needs (08/25/2024)  Utilities: Not At Risk (08/25/2024)  Alcohol Screen: Low Risk (08/09/2024)  Depression (PHQ2-9): Low Risk (08/11/2024)  Financial Resource Strain: Low Risk  (08/19/2024)   Received from [facility]  Physical Activity: Insufficiently Active (02/02/2024)   Received from [facility]  Social Connections: Socially Integrated (08/25/2024)  Stress: No Stress Concern Present (08/09/2024)  Tobacco Use: Low Risk (08/25/2024)  Health Literacy: Adequate Health Literacy (02/02/2024)   SDOH Interventions:     Readmission Risk Interventions     No data to display

## 2024-08-25 NOTE — Progress Notes (Signed)
 Noted:  weak dorsiflexion, moderate plantar flexion bilaterally.

## 2024-08-25 NOTE — Transfer of Care (Signed)
 Immediate Anesthesia Transfer of Care Note  Patient: Sandra Phillips  Procedure(s) Performed: ARTHROPLASTY, KNEE, TOTAL, USING IMAGELESS COMPUTER-ASSISTED NAVIGATION (Right: Knee)  Patient Location: PACU  Anesthesia Type:Spinal  Level of Consciousness: awake and alert   Airway & Oxygen Therapy: Patient Spontanous Breathing and Patient connected to face mask oxygen  Post-op Assessment: Report given to RN and Post -op Vital signs reviewed and stable  Post vital signs: Reviewed and stable  Last Vitals:  Vitals Value Taken Time  BP 103/63 08/25/24 12:45  Temp    Pulse 68 08/25/24 12:47  Resp 0 08/25/24 12:47  SpO2 98 % 08/25/24 12:47  Vitals shown include unfiled device data.  Last Pain:  Vitals:   08/25/24 0716  TempSrc: Temporal  PainSc: 0-No pain         Complications: No notable events documented.

## 2024-08-25 NOTE — Op Note (Signed)
 OPERATIVE NOTE  DATE OF SURGERY:  08/25/2024  PATIENT NAME:  Sandra Phillips   DOB: [birth year]  MRN: [id number]  PRE-OPERATIVE DIAGNOSIS: Degenerative arthrosis of the right knee, primary  POST-OPERATIVE DIAGNOSIS:  Same  PROCEDURE:  Right total knee arthroplasty using computer-assisted navigation  SURGEON:  Lynwood SHAUNNA Mardee Mickey. M.D.  ANESTHESIA: spinal  ESTIMATED BLOOD LOSS: 50 mL  FLUIDS REPLACED: 500 mL of crystalloid  TOURNIQUET TIME: 108 minutes  DRAINS: 2 medium Hemovac drains  SOFT TISSUE RELEASES: Anterior cruciate ligament, posterior cruciate ligament, deep medial collateral ligament, patellofemoral ligament  IMPLANTS UTILIZED: DePuy Attune size 6N posterior stabilized femoral component (cemented), size 5 rotating platform tibial component (cemented), 35 mm medialized dome patella (cemented), and a 5 mm stabilized rotating platform polyethylene insert.  INDICATIONS FOR SURGERY: Sandra Phillips is a 74 y.o. year old female with a long history of progressive knee pain. X-rays demonstrated severe degenerative changes in tricompartmental fashion. The patient had not seen any significant improvement despite conservative nonsurgical intervention. After discussion of the risks and benefits of surgical intervention, the patient expressed understanding of the risks benefits and agree with plans for total knee arthroplasty.   The risks, benefits, and alternatives were discussed at length including but not limited to the risks of infection, bleeding, nerve injury, stiffness, blood clots, the need for revision surgery, cardiopulmonary complications, among others, and they were willing to proceed.  PROCEDURE IN DETAIL: The patient was brought into the operating room and, after adequate spinal anesthesia was achieved, a tourniquet was placed on the patient's upper thigh. The patient's knee and leg were cleaned and prepped with alcohol and DuraPrep and draped in the usual sterile fashion. A  timeout was performed as per usual protocol. The lower extremity was exsanguinated using an Esmarch, and the tourniquet was inflated to 300 mmHg. An anterior longitudinal incision was made followed by a standard mid vastus approach. The deep fibers of the medial collateral ligament were elevated in a subperiosteal fashion off of the medial flare of the tibia so as to maintain a continuous soft tissue sleeve. The patella was subluxed laterally and the patellofemoral ligament was incised. Inspection of the knee demonstrated severe degenerative changes with full-thickness loss of articular cartilage. Osteophytes were debrided using a rongeur. Anterior and posterior cruciate ligaments were excised. Two 4.0 mm Schanz pins were inserted in the femur and into the tibia for attachment of the array of trackers used for computer-assisted navigation. Hip center was identified using a circumduction technique. Distal landmarks were mapped using the computer. The distal femur and proximal tibia were mapped using the computer. The distal femoral cutting guide was positioned using computer-assisted navigation so as to achieve a 5 distal valgus cut. The femur was sized and it was felt that a size 6N femoral component was appropriate. A size 6 femoral cutting guide was positioned and the anterior cut was performed and verified using the computer. This was followed by completion of the posterior and chamfer cuts. Femoral cutting guide for the central box was then positioned in the center box cut was performed.  Attention was then directed to the proximal tibia. Medial and lateral menisci were excised. The extramedullary tibial cutting guide was positioned using computer-assisted navigation so as to achieve a 0 varus-valgus alignment and 3 posterior slope. The cut was performed and verified using the computer. The proximal tibia was sized and it was felt that a size 5 tibial tray was appropriate. Tibial and femoral trials were  inserted followed  by insertion of a 5 mm polyethylene insert. This allowed for excellent mediolateral soft tissue balancing both in flexion and in full extension. Finally, the patella was cut and prepared so as to accommodate a 35 mm medialized dome patella. A patella trial was placed and the knee was placed through a range of motion with excellent patellar tracking appreciated. The femoral trial was removed after debridement of posterior osteophytes. The central post-hole for the tibial component was reamed followed by insertion of a keel punch. Tibial trials were then removed. Cut surfaces of bone were irrigated with copious amounts of normal saline using pulsatile lavage and then suctioned dry. Polymethylmethacrylate cement with gentamicin  was prepared in the usual fashion using a vacuum mixer. Cement was applied to the cut surface of the proximal tibia as well as along the undersurface of a size 5 rotating platform tibial component. Tibial component was positioned and impacted into place. Excess cement was removed using Personal assistant. Cement was then applied to the cut surfaces of the femur as well as along the posterior flanges of the size 6N femoral component. The femoral component was positioned and impacted into place. Excess cement was removed using Personal assistant. A 5 mm polyethylene trial was inserted and the knee was brought into full extension with steady axial compression applied. Finally, cement was applied to the backside of a 35 mm medialized dome patella and the patellar component was positioned and patellar clamp applied. Excess cement was removed using Personal assistant. After adequate curing of the cement, the tourniquet was deflated after a total tourniquet time of 108 minutes. Hemostasis was achieved using electrocautery. The knee was irrigated with copious amounts of normal saline using pulsatile lavage followed by 450 ml of Surgiphor and then suctioned dry. 20 mL of 1.3% Exparel  and 60 mL  of 0.25% Marcaine  in 40 mL of normal saline was injected along the posterior capsule, medial and lateral gutters, and along the arthrotomy site. A 5 mm stabilized rotating platform polyethylene insert was inserted and the knee was placed through a range of motion with excellent mediolateral soft tissue balancing appreciated and excellent patellar tracking noted. 2 medium drains were placed in the wound bed and brought out through separate stab incisions. The medial parapatellar portion of the incision was reapproximated using interrupted sutures of #1 Vicryl. Subcutaneous tissue was approximated in layers using first #0 Vicryl followed #2-0 Vicryl. The skin was approximated with skin staples. A sterile dressing was applied.  The patient tolerated the procedure well and was transported to the recovery room in stable condition.    Damen Windsor P. Guinevere Stephenson, Jr., M.D.

## 2024-08-25 NOTE — Anesthesia Procedure Notes (Signed)
 Spinal  Patient location during procedure: OR Start time: 08/25/2024 8:29 AM End time: 08/25/2024 8:32 AM Reason for block: surgical anesthesia  Staffing Performed: anesthesiologist  Authorized by: Vicci Camellia Glatter, MD   Performed by: Vicci Camellia Glatter, MD  Preanesthetic Checklist Completed: patient identified, IV checked, site marked, risks and benefits discussed, surgical consent, monitors and equipment checked, pre-op evaluation and timeout performed Spinal Block Patient position: sitting Prep: DuraPrep Patient monitoring: heart rate, cardiac monitor, continuous pulse ox and blood pressure Approach: midline Location: L3-4 Injection technique: single-shot Needle Needle type: Pencan  Needle gauge: 24 G Needle length: 9 cm Assessment Sensory level: T10 Events: CSF return

## 2024-08-25 NOTE — Evaluation (Signed)
 Physical Therapy Evaluation Patient Details Name: Sandra Phillips MRN: [id number] DOB: [birth year] Today's Date: 08/25/2024  History of Present Illness  Pt is a 74 y/o F admitted on 08/25/24 for scheduled R TKA. PMH: IVC filter placement 08/23/24, endometrial CA  Clinical Impression  Pt seen for PT evaluation with pt agreeable, spouse present. Pt reports prior to admission she was independent, ambulating without AD, denies falls. Provided pt with HEP handout. Pt reports urgent need to void, transfers bed<>BSC with RW & min assist. Pt requires cuing re: hand placement during transfers. Pt able to progress to gait in hallway with RW & CGA<>min assist, R knee instability as she fatigues. Recommend ongoing PT services to progress mobility as able.        If plan is discharge home, recommend the following: A little help with bathing/dressing/bathroom;Assistance with cooking/housework;A little help with walking and/or transfers;Assist for transportation;Help with stairs or ramp for entrance   Can travel by private vehicle        Equipment Recommendations BSC/3in1  Recommendations for Other Services       Functional Status Assessment Patient has had a recent decline in their functional status and demonstrates the ability to make significant improvements in function in a reasonable and predictable amount of time.     Precautions / Restrictions Precautions Precautions: Fall Restrictions Weight Bearing Restrictions Per Provider Order: Yes RLE Weight Bearing Per Provider Order: Weight bearing as tolerated      Mobility  Bed Mobility Overal bed mobility: Needs Assistance Bed Mobility: Supine to Sit     Supine to sit: Supervision, HOB elevated, Used rails (exit L side of bed)          Transfers Overall transfer level: Needs assistance Equipment used: Rolling walker (2 wheels) Transfers: Sit to/from Stand, Bed to chair/wheelchair/BSC Sit to Stand: Min assist, Contact guard assist   Step  pivot transfers: Min assist       General transfer comment: cuing re: hand placement during sit<>stand transfers with RW    Ambulation/Gait Ambulation/Gait assistance: Contact guard assist, Min assist Gait Distance (Feet): 75 Feet Assistive device: Rolling walker (2 wheels) Gait Pattern/deviations: Decreased step length - right, Decreased step length - left, Decreased stride length Gait velocity: decreased     General Gait Details: cuing for increased RLE terminal knee extension in RLE stance phase as pt with some anterior instability as pt fatigued  Stairs            Wheelchair Mobility     Tilt Bed    Modified Rankin (Stroke Patients Only)       Balance Overall balance assessment: Needs assistance Sitting-balance support: Feet supported Sitting balance-Leahy Scale: Good     Standing balance support: During functional activity, Bilateral upper extremity supported, Reliant on assistive device for balance Standing balance-Leahy Scale: Fair                               Pertinent Vitals/Pain Pain Assessment Pain Assessment: 0-10 Pain Score: 5  Pain Location: R knee Pain Descriptors / Indicators: Discomfort Pain Intervention(s): Limited activity within patient's tolerance, Monitored during session, Ice applied, Repositioned    Home Living Family/patient expects to be discharged to:: Private residence Living Arrangements: Spouse/significant other Available Help at Discharge: Family;Available 24 hours/day Type of Home: House Home Access: Level entry       Home Layout: One level Home Equipment: Cane - single [employer] (2 wheels);Tub bench  Prior Function Prior Level of Function : Independent/Modified Independent             Mobility Comments: independent without AD, driving, denies falls ADLs Comments: independent with bathing & dressing     Extremity/Trunk Assessment   Upper Extremity Assessment Upper  Extremity Assessment: Overall WFL for tasks assessed    Lower Extremity Assessment Lower Extremity Assessment: RLE deficits/detail RLE Deficits / Details: 2+/5 knee extension in sitting       Communication   Communication Communication: No apparent difficulties    Cognition Arousal: Alert Behavior During Therapy: WFL for tasks assessed/performed   PT - Cognitive impairments: No apparent impairments                         Following commands: Intact       Cueing Cueing Techniques: Verbal cues     General Comments General comments (skin integrity, edema, etc.): Pt with continent void on BSC. Educated pt on use of polar care, need to promote R knee extension, use of bone foam.    Exercises Total Joint Exercises Long Arc Quad: AROM, Seated, Strengthening, Right, 10 reps Knee Flexion: AAROM, AROM, Seated, Right, 10 reps   Assessment/Plan    PT Assessment Patient needs continued PT services  PT Problem List Decreased strength;Pain;Decreased balance;Decreased activity tolerance;Decreased range of motion;Decreased knowledge of use of DME;Decreased safety awareness;Decreased knowledge of precautions;Decreased mobility       PT Treatment Interventions DME instruction;Therapeutic exercise;Gait training;Balance training;Stair training;Neuromuscular re-education;Functional mobility training;Therapeutic activities;Patient/family education;Modalities;Manual techniques    PT Goals (Current goals can be found in the Care Plan section)  Acute Rehab PT Goals Patient Stated Goal: recover s/p TKA PT Goal Formulation: With patient Time For Goal Achievement: 09/08/24 Potential to Achieve Goals: Good    Frequency BID     Co-evaluation               AM-PAC PT 6 Clicks Mobility  Outcome Measure Help needed turning from your back to your side while in a flat bed without using bedrails?: None Help needed moving from lying on your back to sitting on the side of a flat  bed without using bedrails?: A Little Help needed moving to and from a bed to a chair (including a wheelchair)?: A Little Help needed standing up from a chair using your arms (e.g., wheelchair or bedside chair)?: A Little Help needed to walk in hospital room?: A Little Help needed climbing 3-5 steps with a railing? : A Little 6 Click Score: 19    End of Session   Activity Tolerance: Patient tolerated treatment well Patient left: in chair;with call bell/phone within reach (RLE polar care donned) Nurse Communication: Mobility status PT Visit Diagnosis: Unsteadiness on feet (R26.81);Muscle weakness (generalized) (M62.81);Pain;Other abnormalities of gait and mobility (R26.89);Difficulty in walking, not elsewhere classified (R26.2) Pain - Right/Left: Right Pain - part of body: Knee    Time: 8475-8440 PT Time Calculation (min) (ACUTE ONLY): 35 min   Charges:   PT Evaluation $PT Eval Low Complexity: 1 Low PT Treatments $Therapeutic Activity: 8-22 mins PT General Charges $$ ACUTE PT VISIT: 1 Visit         Sandra Phillips, PT, DPT 08/25/24, 3:56 PM   Sandra Phillips 08/25/2024, 3:55 PM

## 2024-08-25 NOTE — Anesthesia Preprocedure Evaluation (Addendum)
 "                                  Anesthesia Evaluation  Patient identified by MRN, date of birth, ID band Patient awake    Reviewed: Allergy & Precautions, H&P , NPO status , Patient's Chart, lab work & pertinent test results  Airway Mallampati: II  TM Distance: >3 FB Neck ROM: full    Dental  (+) Chipped   Pulmonary neg pulmonary ROS   Pulmonary exam normal        Cardiovascular Exercise Tolerance: Good hypertension, Normal cardiovascular exam  Echo 12/22: Performed peri-PE   1. Left ventricular ejection fraction, by estimation, is 60 to 65%. The  left ventricle has normal function. The left ventricle has no regional  wall motion abnormalities. Left ventricular diastolic parameters were  normal.   2. Right ventricular systolic function is mildly reduced. The right  ventricular size is severely enlarged. Mildly increased right ventricular  wall thickness. There is severely elevated pulmonary artery systolic  pressure.   3. Right atrial size was moderately dilated.   4. The mitral valve is normal in structure. Trivial mitral valve  regurgitation.   5. Tricuspid valve regurgitation is moderate to severe.   6. The aortic valve is normal in structure. Aortic valve regurgitation is  not visualized.     Neuro/Psych negative neurological ROS  negative psych ROS   GI/Hepatic negative GI ROS, Neg liver ROS,,,  Endo/Other  negative endocrine ROS    Renal/GU      Musculoskeletal  (+) Arthritis ,    Abdominal  (+) + obese  Peds  Hematology negative hematology ROS (+)   Anesthesia Other Findings history of DVT, will have IVC filter placement on 08/23/2024  Past Medical History: No date: Anticoagulated on apixaban  No date: Aortic atherosclerosis No date: Current long-term use of anticoagulant medication with  history of deep venous thrombosis (DVT) No date: Endometrial cancer (HCC) 12/13/2021: History of 2019 novel coronavirus disease (COVID-19) No  date: History of DVT (deep vein thrombosis) 2022: History of pulmonary embolus (PE)     Comment:  post knee surgery No date: History of radiation therapy     Comment:  Vagina (HDR) 06/26/22-07/10/22- Dr. Lynwood Nasuti No date: Hyperlipidemia No date: Hypertension No date: Osteoarthritis of right knee No date: Primary osteoarthritis No date: Sinus tachycardia No date: Tubular adenoma of colon No date: Vitamin D  deficiency  Past Surgical History: No date: ABDOMINAL HYSTERECTOMY 01/11/2022: COLONOSCOPY WITH PROPOFOL ; N/A     Comment:  Procedure: COLONOSCOPY WITH PROPOFOL ;  Surgeon: Therisa Bi, MD;  Location: [facility] ENDOSCOPY;  Service:               Gastroenterology;  Laterality: N/A; 03/20/2023: COLONOSCOPY WITH PROPOFOL ; N/A     Comment:  Procedure: COLONOSCOPY WITH PROPOFOL ;  Surgeon: Therisa Bi, MD;  Location: [facility] ENDOSCOPY;  Service:               Gastroenterology;  Laterality: N/A; 08/23/2024: IVC FILTER INSERTION; N/A     Comment:  Procedure: IVC FILTER INSERTION;  Surgeon: Marea Selinda RAMAN,              MD;  Location: [facility] INVASIVE CV LAB;  Service:  Cardiovascular;  Laterality: N/A; 06/12/2021: KNEE ARTHROSCOPY; Left     Comment:  Procedure: LEFT KNEE ARTHROSCOPY WITH DEBRIDEMENT AND               REPAIR OF A POSTERIOR MEDIAL ROOT TEAR AND ABRASION               CONDOPLASTY;  Surgeon: Edie Norleen PARAS, MD;  Location: [facility]              ORS;  Service: Orthopedics;  Laterality: Left; 07/09/2021: PULMONARY THROMBECTOMY; N/A     Comment:  Procedure: PULMONARY THROMBECTOMY;  Surgeon: Marea Selinda RAMAN, MD;  Location: [facility] INVASIVE CV LAB;  Service:               Cardiovascular;  Laterality: N/A; 03/2022: ROBOTIC ASSISTED TOTAL HYSTERECTOMY     Reproductive/Obstetrics negative OB ROS                              Anesthesia Physical Anesthesia Plan  ASA: 2  Anesthesia Plan: Spinal   Post-op Pain  Management: Regional block*, Ofirmev  IV (intra-op)* and Toradol  IV (intra-op)*   Induction: Intravenous  PONV Risk Score and Plan: Propofol  infusion  Airway Management Planned: Natural Airway  Additional Equipment:   Intra-op Plan:   Post-operative Plan:   Informed Consent: I have reviewed the patients History and Physical, chart, labs and discussed the procedure including the risks, benefits and alternatives for the proposed anesthesia with the patient or authorized representative who has indicated his/her understanding and acceptance.     Dental Advisory Given  Plan Discussed with: CRNA and Surgeon  Anesthesia Plan Comments:          Anesthesia Quick Evaluation  "

## 2024-08-25 NOTE — Progress Notes (Signed)
 Patient is not able to walk the distance required to go the bathroom, or he/she is unable to safely negotiate stairs required to access the bathroom.  A 3-in-1 BSC will alleviate this problem     Sandra Phillips, Jr. M.D.    Patient has mobility impairment for daily activities. A rolling walker will resolve this and the patient is safe to use it  [employer]. Sandra Phillips, Jr., M.D.

## 2024-08-25 NOTE — Discharge Summary (Incomplete)
 " Physician Discharge Summary  Patient ID: Sandra Phillips MRN: [id number] DOB/AGE: [birth year] 74 y.o.  Admit date: 08/25/2024 Discharge date: 08/25/2024  Admission Diagnoses:  Primary osteoarthritis of right knee [M17.11] History of total knee arthroplasty, right [Z96.651]  Discharge Diagnoses: Patient Active Problem List   Diagnosis Date Noted   History of total knee arthroplasty, right 08/25/2024   Tubular adenoma of colon 02/03/2023   Chronic pulmonary embolism (HCC) 08/02/2022   Vitamin D  deficiency 05/08/2022   Essential hypertension 04/29/2022   Uterine mass 04/11/2022   Osteoporosis 04/05/2022   Endometrial cancer (HCC) 03/13/2022   Encounter for preventive health examination 01/05/2022   Aortic atherosclerosis 12/08/2021   Obesity (BMI 35.0-39.9 without comorbidity) 12/08/2021   History of pulmonary embolism 07/07/2021   Sinus tachycardia 07/07/2021   Primary osteoarthritis 07/07/2021   History of deep venous thrombosis (DVT) of distal vein of left lower extremity 07/07/2021   Primary osteoarthritis of left knee 06/11/2021    Past Medical History:  Diagnosis Date   Anticoagulated on apixaban     Aortic atherosclerosis    Current long-term use of anticoagulant medication with history of deep venous thrombosis (DVT)    Endometrial cancer (HCC)    History of 2019 novel coronavirus disease (COVID-19) 12/13/2021   History of DVT (deep vein thrombosis)    History of pulmonary embolus (PE) 2022   post knee surgery   History of radiation therapy    Vagina (HDR) 06/26/22-07/10/22- Dr. Lynwood Nasuti   Hyperlipidemia    Hypertension    Osteoarthritis of right knee    Primary osteoarthritis    Sinus tachycardia    Tubular adenoma of colon    Vitamin D  deficiency      Transfusion: None.   Consultants (if any):   Discharged Condition: Improved  Hospital Course: Sandra Phillips is an 74 y.o. female who was admitted 08/25/2024 with a diagnosis of Primary osteoarthritis of the  right knee and went to the operating room on 08/25/2024 and underwent the above named procedures.    Surgeries: Procedures: ARTHROPLASTY, KNEE, TOTAL, USING IMAGELESS COMPUTER-ASSISTED NAVIGATION on 08/25/2024 Patient tolerated the surgery well. Taken to PACU where she was stabilized and then transferred to the post-op recovery area.  She was re-started on Eliquis  5mg  BID on POD1. Heels elevated on bed with rolled towels. No evidence of DVT. Negative Homan. Physical therapy started on day #1 for gait training and transfer. OT started day #1 for ADL and assisted devices.  Patient's IV and Hemovac were removed on POD1.  Foley was removed shortly after surgery.  Implants: DePuy Attune size 6N posterior stabilized femoral component (cemented), size 5 rotating platform tibial component (cemented), 35 mm medialized dome patella (cemented), and a 5 mm stabilized rotating platform polyethylene insert.   She was given perioperative antibiotics:  Anti-infectives (From admission, onward)    Start     Dose/Rate Route Frequency Ordered Stop   08/25/24 1430  ceFAZolin  (ANCEF ) IVPB 2g/100 mL premix        2 g 200 mL/hr over 30 Minutes Intravenous Every 6 hours 08/25/24 1316 08/25/24 2038   08/25/24 0715  ceFAZolin  (ANCEF ) IVPB 2g/100 mL premix        2 g 200 mL/hr over 30 Minutes Intravenous On call to O.R. 08/25/24 9296 08/25/24 9157     .  She was given sequential compression devices, early ambulation, and Eliquis  for DVT prophylaxis.  She benefited maximally from the hospital stay and there were no complications.    Recent  vital signs:  Vitals:   08/25/24 1554 08/25/24 1938  BP: 110/62 104/60  Pulse: 64 65  Resp: 16 16  Temp:  (!) 97 F (36.1 C)  SpO2: 100% 98%    Recent laboratory studies:  Lab Results  Component Value Date   HGB 13.5 08/09/2024   HGB 14.8 08/07/2023   HGB 14.2 02/03/2023   Lab Results  Component Value Date   WBC 5.2 08/09/2024   PLT 168.0 08/09/2024   Lab  Results  Component Value Date   INR 1.1 07/07/2021   Lab Results  Component Value Date   NA 143 08/19/2024   K 3.9 08/19/2024   CL 105 08/19/2024   CO2 26 08/19/2024   BUN 11 08/19/2024   CREATININE 0.76 08/19/2024   GLUCOSE 103 (H) 08/19/2024    Discharge Medications:   Allergies as of 08/25/2024       Reactions   Penicillins    IgE = 116 (WNL) on 08/19/2024 Unknown childhood reaction    Alendronate  Itching   Oxycodone  Nausea And Vomiting   Dizzy   Shrimp Extract Swelling   Shrimp   Shrimp Flavor Agent (non-screening) Swelling   Shrimp     Med Rec must be completed prior to using this [facility]***        Durable Medical Equipment  (From admission, onward)           Start     Ordered   08/25/24 1248  DME Walker rolling  Once       Question:  Patient needs a walker to treat with the following condition  Answer:  Total knee replacement status   08/25/24 1247   08/25/24 1248  DME Bedside commode  Once       Comments: Patient is not able to walk the distance required to go the bathroom, or he/she is unable to safely negotiate stairs required to access the bathroom.  A 3in1 BSC will alleviate this problem  Question:  Patient needs a bedside commode to treat with the following condition  Answer:  Total knee replacement status   08/25/24 1247            Diagnostic Studies: DG Knee Right Port Result Date: 08/25/2024 CLINICAL DATA:  Status post right total knee replacement. EXAM: PORTABLE RIGHT KNEE - 1-2 VIEW COMPARISON:  None Available. FINDINGS: The femoral and tibial components are well situated. Expected postoperative changes are seen in the soft tissues including surgical drain anteriorly. IMPRESSION: Status post right total knee arthroplasty. Electronically Signed   By: Lynwood Landy Raddle M.D.   On: 08/25/2024 13:02   PERIPHERAL VASCULAR CATHETERIZATION Result Date: 08/23/2024 See surgical note for result.   Disposition: Plan for discharge home today pending  progress with PT. There are no questions and answers to display.           Follow-up Information     Aron everitt [place], [place], [place] Follow up on 09/09/2024.   Specialty: Orthopedic Surgery Why: at 2:00pm Contact information: [address] [place] [address] [phone]         Mardee Lynwood SQUIBB, MD Follow up on 10/12/2024.   Specialty: Orthopedic Surgery Why: at 10:00am Contact information: [address] [facility] [place] [address] [phone]                  Signed: Lynwood LITTIE Level PA-C 08/25/2024, 8:50 PM  "

## 2024-08-26 ENCOUNTER — Other Ambulatory Visit: Payer: Self-pay

## 2024-08-26 ENCOUNTER — Encounter: Payer: Self-pay | Admitting: Orthopedic Surgery

## 2024-08-26 MED ORDER — TRAMADOL HCL 50 MG PO TABS
50.0000 mg | ORAL_TABLET | ORAL | 0 refills | Status: AC | PRN
  Filled 2024-08-26: qty 30, 4d supply, fill #0

## 2024-08-26 MED ORDER — CELECOXIB 200 MG PO CAPS
200.0000 mg | ORAL_CAPSULE | Freq: Two times a day (BID) | ORAL | 0 refills | Status: AC
  Filled 2024-08-26: qty 30, 15d supply, fill #0

## 2024-08-26 MED ORDER — ONDANSETRON HCL 4 MG PO TABS
4.0000 mg | ORAL_TABLET | Freq: Four times a day (QID) | ORAL | 0 refills | Status: AC | PRN
  Filled 2024-08-26: qty 30, 8d supply, fill #0

## 2024-08-26 MED ORDER — HYDROCODONE-ACETAMINOPHEN 5-325 MG PO TABS
1.0000 | ORAL_TABLET | Freq: Four times a day (QID) | ORAL | 0 refills | Status: AC | PRN
  Filled 2024-08-26: qty 30, 4d supply, fill #0

## 2024-08-26 NOTE — Progress Notes (Signed)
 DISCHARGE NOTE:  Pt given discharge instructions and verbalized understanding. TED hose on both legs. Beds to meds medications sent with pt, pt wheeled to car by staff. Husband providing transportation home.

## 2024-08-26 NOTE — Plan of Care (Signed)
" °  Problem: Education: Goal: Knowledge of the prescribed therapeutic regimen will improve Outcome: Progressing   Problem: Bowel/Gastric: Goal: Gastrointestinal status for postoperative course will improve Outcome: Progressing   Problem: Nutritional: Goal: Will attain and maintain optimal nutritional status Outcome: Progressing   Problem: Clinical Measurements: Goal: Ability to maintain clinical measurements within normal limits Outcome: Progressing   Problem: Respiratory: Goal: Will regain and/or maintain adequate ventilation Outcome: Progressing   "

## 2024-08-26 NOTE — Evaluation (Signed)
 Occupational Therapy Evaluation Patient Details Name: Sandra Phillips MRN: [id number] DOB: 09/11/1950 Today's Date: 08/26/2024   History of Present Illness   Pt is a 74 y/o F admitted on 08/25/24 for scheduled R TKA. PMH: IVC filter placement 08/23/24, endometrial CA     Clinical Impressions Patient was seen for OT evaluation this date. Prior to hospital admission, patient was active and independent. Patient lives in single story home with good support from spouse.  OT educated patient on energy conservation and fall prevention techniques to implement into ADL routine, patient able to return demo full dressing, toileting and grooming without any assist. OT educated on polar care ice with good return demo from spouse. All OT needs identified and patient is mod I, no further OT required. OT to sign off. Patient agreeable.      If plan is discharge home, recommend the following:   Help with stairs or ramp for entrance     Functional Status Assessment   Patient has had a recent decline in their functional status and demonstrates the ability to make significant improvements in function in a reasonable and predictable amount of time.     Equipment Recommendations   None recommended by OT     Recommendations for Other Services         Precautions/Restrictions   Precautions Precautions: Fall Recall of Precautions/Restrictions: Intact Restrictions Weight Bearing Restrictions Per Provider Order: Yes RLE Weight Bearing Per Provider Order: Weight bearing as tolerated     Mobility Bed Mobility               General bed mobility comments: OOB pre/post tx    Transfers Overall transfer level: Needs assistance Equipment used: Rolling walker (2 wheels) Transfers: Sit to/from Stand Sit to Stand: Supervision                  Balance Overall balance assessment: Needs assistance Sitting-balance support: Feet supported Sitting balance-Leahy Scale: Normal     Standing  balance support: During functional activity, Bilateral upper extremity supported, Reliant on assistive device for balance Standing balance-Leahy Scale: Good                             ADL either performed or assessed with clinical judgement   ADL Overall ADL's : Modified independent                                       General ADL Comments: able to dress, perform toileting and grooming with out physical A after education was provided, good return demo     Vision         Perception         Praxis         Pertinent Vitals/Pain Pain Assessment Pain Assessment: 0-10 Pain Score: 5  Pain Location: R knee Pain Descriptors / Indicators: Discomfort Pain Intervention(s): Patient requesting pain meds-RN notified     Extremity/Trunk Assessment Upper Extremity Assessment Upper Extremity Assessment: Overall WFL for tasks assessed   Lower Extremity Assessment Lower Extremity Assessment: Defer to PT evaluation       Communication Communication Communication: No apparent difficulties   Cognition Arousal: Alert Behavior During Therapy: WFL for tasks assessed/performed Cognition: No apparent impairments  Following commands: Intact       Cueing  General Comments   Cueing Techniques: Verbal cues      Exercises     Shoulder Instructions      Home Living Family/patient expects to be discharged to:: Private residence Living Arrangements: Spouse/significant other Available Help at Discharge: Family;Available 24 hours/day Type of Home: House Home Access: Level entry     Home Layout: One level     Bathroom Shower/Tub: Chief Strategy Officer: Handicapped height Bathroom Accessibility: Yes How Accessible: Accessible via walker Home Equipment: Cane - single Market Researcher (2 wheels);Tub bench          Prior Functioning/Environment Prior Level of Function :  Independent/Modified Independent             Mobility Comments: independent without AD, driving, denies falls ADLs Comments: independent with bathing & dressing    OT Problem List: Decreased activity tolerance   OT Treatment/Interventions:        OT Goals(Current goals can be found in the care plan section)   Acute Rehab OT Goals Patient Stated Goal: to go home Potential to Achieve Goals: Good   OT Frequency:       Co-evaluation              AM-PAC OT 6 Clicks Daily Activity     Outcome Measure Help from another person eating meals?: None Help from another person taking care of personal grooming?: None Help from another person toileting, which includes using toliet, bedpan, or urinal?: None Help from another person bathing (including washing, rinsing, drying)?: None Help from another person to put on and taking off regular upper body clothing?: None Help from another person to put on and taking off regular lower body clothing?: None 6 Click Score: 24   End of Session Equipment Utilized During Treatment: Rolling walker (2 wheels) Nurse Communication: Patient requests pain meds  Activity Tolerance: Patient tolerated treatment well Patient left: in chair;with call bell/phone within reach;with family/visitor present                   Time: 9068-8996 OT Time Calculation (min): 32 min Charges:  OT General Charges $OT Visit: 1 Visit OT Evaluation $OT Eval Low Complexity: 1 Low  Rogers Clause, OT/L MSOT, 08/26/2024

## 2024-08-26 NOTE — Anesthesia Postprocedure Evaluation (Signed)
"   Anesthesia Post Note  Patient: Sandra Phillips  Procedure(s) Performed: ARTHROPLASTY, KNEE, TOTAL, USING IMAGELESS COMPUTER-ASSISTED NAVIGATION (Right: Knee)  Patient location during evaluation: Short Stay Anesthesia Type: Spinal Level of consciousness: awake and alert and oriented Pain management: satisfactory to patient Vital Signs Assessment: post-procedure vital signs reviewed and stable Respiratory status: spontaneous breathing Cardiovascular status: stable Postop Assessment: patient able to bend at knees, no apparent nausea or vomiting, adequate PO intake and able to ambulate Anesthetic complications: no Comments: Has been able to void.   No notable events documented.   Last Vitals:  Vitals:   08/26/24 0100 08/26/24 0430  BP: 102/60 95/65  Pulse: 68 62  Resp: 16 16  Temp: (!) 36.3 C (!) 36.2 C  SpO2: 97% 97%    Last Pain:  Vitals:   08/26/24 0500  TempSrc:   PainSc: 1                  Abagale Boulos      "

## 2024-08-26 NOTE — Progress Notes (Signed)
" °  Subjective: 1 Day Post-Op Procedures (LRB): ARTHROPLASTY, KNEE, TOTAL, USING IMAGELESS COMPUTER-ASSISTED NAVIGATION (Right) Patient reports pain as mild.   Patient is well, and has had no acute complaints or problems Plan is to go Home after hospital stay. Negative for chest pain and shortness of breath Fever: no Gastrointestinal: Negative for nausea and vomiting  Objective: Vital signs in last 24 hours: Temp:  [97 F (36.1 C)-97.9 F (36.6 C)] 97.1 F (36.2 C) (02/05 0430) Pulse Rate:  [61-81] 62 (02/05 0430) Resp:  [13-20] 16 (02/05 0430) BP: (95-110)/(56-81) 95/65 (02/05 0430) SpO2:  [94 %-100 %] 97 % (02/05 0430)  Intake/Output from previous day:  Intake/Output Summary (Last 24 hours) at 08/26/2024 0818 Last data filed at 08/26/2024 0500 Gross per 24 hour  Intake 2916.67 ml  Output 570 ml  Net 2346.67 ml    Intake/Output this shift: No intake/output data recorded.  Labs: No results for input(s): HGB in the last 72 hours. No results for input(s): WBC, RBC, HCT, PLT in the last 72 hours. No results for input(s): NA, K, CL, CO2, BUN, CREATININE, GLUCOSE, CALCIUM  in the last 72 hours. No results for input(s): LABPT, INR in the last 72 hours.   EXAM General - Patient is Alert and Oriented Extremity - Neurovascular intact Sensation intact distally Dorsiflexion/Plantar flexion intact Compartment soft Dressing/Incision - clean, dry, with the Hemovac removed with no complication.  The Hemovac tubing was intact on removal. Motor Function - intact, moving foot and toes well on exam.  Ambulated 200 feet including stairs with physical therapy.  Past Medical History:  Diagnosis Date   Anticoagulated on apixaban     Aortic atherosclerosis    Current long-term use of anticoagulant medication with history of deep venous thrombosis (DVT)    Endometrial cancer (HCC)    History of 2019 novel coronavirus disease (COVID-19) 12/13/2021   History of DVT  (deep vein thrombosis)    History of pulmonary embolus (PE) 2022   post knee surgery   History of radiation therapy    Vagina (HDR) 06/26/22-07/10/22- Dr. Lynwood Nasuti   Hyperlipidemia    Hypertension    Osteoarthritis of right knee    Primary osteoarthritis    Sinus tachycardia    Tubular adenoma of colon    Vitamin D  deficiency     Assessment/Plan: 1 Day Post-Op Procedures (LRB): ARTHROPLASTY, KNEE, TOTAL, USING IMAGELESS COMPUTER-ASSISTED NAVIGATION (Right) Principal Problem:   History of total knee arthroplasty, right  Estimated body mass index is 31.02 kg/m as calculated from the following:   Height as of this encounter: 5' 8 (1.727 m).   Weight as of this encounter: 92.5 kg. Advance diet Up with therapy D/C IV fluids Discharge home with home health  DVT Prophylaxis -Eliquis  and TED support hose Weight-Bearing as tolerated to right leg  Krystal Doyne, PA-C Orthopaedic Surgery 08/26/2024, 8:18 AM  "

## 2024-08-26 NOTE — TOC Progression Note (Signed)
 Transition of Care [facility]) - Progression Note    Patient Details  Name: MARSHA GUNDLACH MRN: [id number] Date of Birth: [birth year]  Transition of [facility] CM/SW Contact  Leshawn Houseworth L Soliyana Mcchristian, [place] Phone Number: 08/26/2024, 9:30 AM  Clinical Narrative:     Patient received a BSC and a RW previously. Per [employer], her insurance will not cover a new order.                     Expected Discharge Plan and Services         Expected Discharge Date: 08/26/24                                     Social Drivers of Health (SDOH) Interventions SDOH Screenings   Food Insecurity: No Food Insecurity (08/25/2024)  Housing: Low Risk (08/25/2024)  Transportation Needs: No Transportation Needs (08/25/2024)  Utilities: Not At Risk (08/25/2024)  Alcohol Screen: Low Risk (08/09/2024)  Depression (PHQ2-9): Low Risk (08/11/2024)  Financial Resource Strain: Low Risk  (08/19/2024)   Received from [facility]  Physical Activity: Insufficiently Active (02/02/2024)   Received from [facility]  Social Connections: Socially Integrated (08/25/2024)  Stress: No Stress Concern Present (08/09/2024)  Tobacco Use: Low Risk (08/25/2024)  Health Literacy: Adequate Health Literacy (02/02/2024)    Readmission Risk Interventions     No data to display

## 2024-08-26 NOTE — Progress Notes (Signed)
 Physical Therapy Treatment Patient Details Name: Sandra Phillips MRN: [id number] DOB: Dec 04, 1950 Today's Date: 08/26/2024   History of Present Illness Pt is a 74 y/o F admitted on 08/25/24 for scheduled R TKA. PMH: IVC filter placement 08/23/24, endometrial CA    PT Comments  Pt was long sitting in bed upon arrival. She is A and O x 4. Agreeable and pleasant throughout. Endorses 4/10 pain at rest that elevated to 5-6/10 pain with wt bearing activity. She demonstrated safe abilities to exit bed, stand to RW, and tolerate ambulation > 200 ft. No LOB or safety concerns with use of RW. Author demonstrated proper stair performance and way to enter elevated bed. She also demonstrated good AAROM in sitting with flex/ext. Reviewed importance of routine mobility, polar care use, positioning, and car transfers. Pt states confidence in safe DC home and is cleared from an acute PT standpoint for safe DC home with HHPT to follow.    If plan is discharge home, recommend the following: A little help with bathing/dressing/bathroom;Assistance with cooking/housework;A little help with walking and/or transfers;Assist for transportation;Help with stairs or ramp for entrance     Equipment Recommendations  None recommended by PT (Pt endorses having all DME needs met)       Precautions / Restrictions Precautions Precautions: Fall Recall of Precautions/Restrictions: Intact Restrictions Weight Bearing Restrictions Per Provider Order: Yes RLE Weight Bearing Per Provider Order: Weight bearing as tolerated     Mobility  Bed Mobility Overal bed mobility: Needs Assistance Bed Mobility: Supine to Sit, Sit to Supine  Supine to sit: Supervision  General bed mobility comments: no physical assistance required to exit bed. Vcs for technique improvements only    Transfers Overall transfer level: Needs assistance Equipment used: Rolling walker (2 wheels) Transfers: Sit to/from Stand Sit to Stand: Contact guard assist    Ambulation/Gait Ambulation/Gait assistance: Supervision Gait Distance (Feet): 220 Feet Assistive device: Rolling walker (2 wheels) Gait Pattern/deviations: Decreased step length - right, Decreased step length - left, Decreased stride length Gait velocity: decreased  General Gait Details: Pt was able to ambulate ~ 220 ft with RW without LOB or safety concerns   Stairs Stairs: Yes  General stair comments: Pt does not have any stairs to enter home. author demonstrated proper stair performance and then demonstrated how pt could get in/out of elevated bed    Balance Overall balance assessment: Needs assistance Sitting-balance support: Feet supported Sitting balance-Leahy Scale: Good     Standing balance support: During functional activity, Bilateral upper extremity supported, Reliant on assistive device for balance Standing balance-Leahy Scale: Fair     Hotel Manager: No apparent difficulties  Cognition Arousal: Alert Behavior During Therapy: WFL for tasks assessed/performed   PT - Cognitive impairments: No apparent impairments    PT - Cognition Comments: Pt is A and O x 4 Following commands: Intact      Cueing Cueing Techniques: Verbal cues  Exercises Total Joint Exercises Straight Leg Raises: AROM, 5 reps Knee Flexion: AAROM, 5 reps, Seated Goniometric ROM: ~2-85 degrees AAROM        Pertinent Vitals/Pain Pain Assessment Pain Assessment: 0-10 Pain Score: 5  Pain Location: R knee Pain Descriptors / Indicators: Discomfort Pain Intervention(s): Limited activity within patient's tolerance, Monitored during session, Premedicated before session, Repositioned     PT Goals (current goals can now be found in the care plan section) Acute Rehab PT Goals Patient Stated Goal: recover s/p TKA Progress towards PT goals: Progressing toward goals  Frequency    BID       AM-PAC PT 6 Clicks Mobility   Outcome Measure  Help needed  turning from your back to your side while in a flat bed without using bedrails?: None Help needed moving from lying on your back to sitting on the side of a flat bed without using bedrails?: A Little Help needed moving to and from a bed to a chair (including a wheelchair)?: A Little Help needed standing up from a chair using your arms (e.g., wheelchair or bedside chair)?: A Little Help needed to walk in hospital room?: A Little Help needed climbing 3-5 steps with a railing? : A Little 6 Click Score: 19    End of Session   Activity Tolerance: Patient tolerated treatment well Patient left: in chair;with call bell/phone within reach Nurse Communication: Mobility status PT Visit Diagnosis: Unsteadiness on feet (R26.81);Muscle weakness (generalized) (M62.81);Pain;Other abnormalities of gait and mobility (R26.89);Difficulty in walking, not elsewhere classified (R26.2) Pain - Right/Left: Right Pain - part of body: Knee     Time: 9266-9183 PT Time Calculation (min) (ACUTE ONLY): 43 min  Charges:    $Gait Training: 8-22 mins $Therapeutic Exercise: 8-22 mins $Therapeutic Activity: 8-22 mins PT General Charges $$ ACUTE PT VISIT: 1 Visit                    Rankin Essex PTA 08/26/24, 8:35 AM

## 2024-08-26 NOTE — Discharge Summary (Signed)
 Physician Discharge Summary  Subjective: 1 Day Post-Op Procedures (LRB): ARTHROPLASTY, KNEE, TOTAL, USING IMAGELESS COMPUTER-ASSISTED NAVIGATION (Right) Patient reports pain as mild.   Patient seen in rounds with Dr. Mardee. Patient is well, and has had no acute complaints or problems Patient is ready to go home with home health physical therapy  Physician Discharge Summary  Patient ID: Sandra Phillips MRN: [id number] DOB/AGE: Mar 31, 1951 74 y.o.  Admit date: 08/25/2024 Discharge date: 08/26/2024  Admission Diagnoses:  Discharge Diagnoses:  Principal Problem:   History of total knee arthroplasty, right   Discharged Condition: fair  Hospital Course: The patient is postop day 1 from a right total knee arthroplasty.  She is doing well with pain management.  Her vitals are stable.  The patient has ambulated with physical therapy this morning.  She is ready to go home with home health physical therapy.  Treatments: surgery:    Right total knee arthroplasty using computer-assisted navigation   SURGEON:  Lynwood SHAUNNA Mardee Mickey. M.D.   ANESTHESIA: spinal   ESTIMATED BLOOD LOSS: 50 mL   FLUIDS REPLACED: 500 mL of crystalloid   TOURNIQUET TIME: 108 minutes   DRAINS: 2 medium Hemovac drains   SOFT TISSUE RELEASES: Anterior cruciate ligament, posterior cruciate ligament, deep medial collateral ligament, patellofemoral ligament   IMPLANTS UTILIZED: DePuy Attune size 6N posterior stabilized femoral component (cemented), size 5 rotating platform tibial component (cemented), 35 mm medialized dome patella (cemented), and a 5 mm stabilized rotating platform polyethylene insert.  Discharge Exam: Blood pressure 111/68, pulse 62, temperature 97.8 F (36.6 C), temperature source Oral, resp. rate 15, height 5' 8 (1.727 m), weight 92.5 kg, SpO2 98%.   Disposition: Discharge disposition: 01-Home or Self Care        Allergies as of 08/26/2024       Reactions   Penicillins    IgE = 116 (WNL)  on 08/19/2024 Unknown childhood reaction    Alendronate  Itching   Oxycodone  Nausea And Vomiting   Dizzy   Shrimp Extract Swelling   Shrimp   Shrimp Flavor Agent (non-screening) Swelling   Shrimp        Medication List     TAKE these medications    acetaminophen  500 MG tablet Commonly known as: TYLENOL  Take 500 mg by mouth every 6 (six) hours as needed for mild pain (pain score 1-3) or moderate pain (pain score 4-6).   celecoxib  200 MG capsule Commonly known as: CELEBREX  Take 1 capsule (200 mg total) by mouth 2 (two) times daily.   cholecalciferol 25 MCG (1000 UNIT) tablet Commonly known as: VITAMIN D3 Take 4,000 Units by mouth in the morning.   docusate sodium  100 MG capsule Commonly known as: COLACE Take 100 mg by mouth at bedtime.   Eliquis  5 MG Tabs tablet Generic drug: apixaban  TAKE 1 TABLET TWICE A DAY   HYDROcodone -acetaminophen  5-325 MG tablet Commonly known as: NORCO/VICODIN Take 1-2 tablets by mouth every 6 (six) hours as needed for up to 4 days for moderate pain (pain score 4-6).   metoprolol  succinate 25 MG 24 hr tablet Commonly known as: TOPROL -XL TAKE 1 TABLET BY MOUTH ONCE DAILY What changed: when to take this   ondansetron  4 MG tablet Commonly known as: ZOFRAN  Take 1 tablet (4 mg total) by mouth every 6 (six) hours as needed for nausea.   rosuvastatin  10 MG tablet Commonly known as: CRESTOR  Take 1 tablet (10 mg total) by mouth daily. What changed: when to take this   telmisartan  40 MG  tablet Commonly known as: MICARDIS  Take 1 tablet (40 mg total) by mouth every morning.   traMADol  50 MG tablet Commonly known as: ULTRAM  Take 1-2 tablets (50-100 mg total) by mouth every 4 (four) hours as needed for moderate pain (pain score 4-6).               Durable Medical Equipment  (From admission, onward)           Start     Ordered   08/25/24 1248  DME Walker rolling  Once       Question:  Patient needs a walker to treat with the  following condition  Answer:  Total knee replacement status   08/25/24 1247   08/25/24 1248  DME Bedside commode  Once       Comments: Patient is not able to walk the distance required to go the bathroom, or he/she is unable to safely negotiate stairs required to access the bathroom.  A 3in1 BSC will alleviate this problem  Question:  Patient needs a bedside commode to treat with the following condition  Answer:  Total knee replacement status   08/25/24 1247            Follow-up Information     [facility]anhe[facility] de Charlynn Dunk, [place]ick V, [place] Follow up on 09/09/2024.   Specialty: Orthopedic Surgery Why: at 2:00pm Contact information: [address] [place] [address] [phone]         Mardee Lynwood SQUIBB, MD Follow up on 10/12/2024.   Specialty: Orthopedic Surgery Why: at 10:00am Contact information: [address] [facility] [place] [address] [phone]                 Signed: VERLINDA, Haidyn Kilburg 08/26/2024, 8:36 AM   Objective: Vital signs in last 24 hours: Temp:  [97 F (36.1 C)-97.9 F (36.6 C)] 97.8 F (36.6 C) (02/05 0833) Pulse Rate:  [61-81] 62 (02/05 0833) Resp:  [13-20] 15 (02/05 0833) BP: (95-111)/(56-81) 111/68 (02/05 0833) SpO2:  [94 %-100 %] 98 % (02/05 0833)  Intake/Output from previous day:  Intake/Output Summary (Last 24 hours) at 08/26/2024 0836 Last data filed at 08/26/2024 0500 Gross per 24 hour  Intake 2916.67 ml  Output 570 ml  Net 2346.67 ml    Intake/Output this shift: No intake/output data recorded.  Labs: No results for input(s): HGB in the last 72 hours. No results for input(s): WBC, RBC, HCT, PLT in the last 72 hours. No results for input(s): NA, K, CL, CO2, BUN, CREATININE, GLUCOSE, CALCIUM  in the last 72 hours. No results for input(s): LABPT, INR in the last 72 hours.  EXAM: General - Patient is Alert and Oriented Extremity - Neurovascular intact Sensation intact  distally Dorsiflexion/Plantar flexion intact Compartment soft Incision - clean, dry, no drainage, with a Hemovac removed with no complication.  The Hemovac tubing was intact on removal. Motor Function -plantarflexion and dorsiflexion intact.  Able to straight leg raise independently.  Ambulated 200 feet with physical therapy including stairs.  Assessment/Plan: 1 Day Post-Op Procedures (LRB): ARTHROPLASTY, KNEE, TOTAL, USING IMAGELESS COMPUTER-ASSISTED NAVIGATION (Right) Procedures (LRB): ARTHROPLASTY, KNEE, TOTAL, USING IMAGELESS COMPUTER-ASSISTED NAVIGATION (Right) Past Medical History:  Diagnosis Date   Anticoagulated on apixaban     Aortic atherosclerosis    Current long-term use of anticoagulant medication with history of deep venous thrombosis (DVT)    Endometrial cancer (HCC)    History of 2019 novel coronavirus disease (COVID-19) 12/13/2021   History of DVT (deep vein thrombosis)  History of pulmonary embolus (PE) 2022   post knee surgery   History of radiation therapy    Vagina (HDR) 06/26/22-07/10/22- Dr. Lynwood Nasuti   Hyperlipidemia    Hypertension    Osteoarthritis of right knee    Primary osteoarthritis    Sinus tachycardia    Tubular adenoma of colon    Vitamin D  deficiency    Principal Problem:   History of total knee arthroplasty, right  Estimated body mass index is 31.02 kg/m as calculated from the following:   Height as of this encounter: 5' 8 (1.727 m).   Weight as of this encounter: 92.5 kg. Advance diet Up with therapy D/C IV fluids Discharge home with home health Diet - Regular diet Follow up - in 2 weeks Activity - WBAT Disposition - Home Condition Upon Discharge - Stable DVT Prophylaxis - TED hose and Eliquis   Krystal Doyne, PA-C Orthopaedic Surgery 08/26/2024, 8:36 AM

## 2024-09-14 ENCOUNTER — Other Ambulatory Visit

## 2024-10-05 ENCOUNTER — Ambulatory Visit (INDEPENDENT_AMBULATORY_CARE_PROVIDER_SITE_OTHER): Admitting: Vascular Surgery

## 2024-10-05 ENCOUNTER — Encounter (INDEPENDENT_AMBULATORY_CARE_PROVIDER_SITE_OTHER)

## 2025-02-04 ENCOUNTER — Ambulatory Visit

## 2025-02-10 ENCOUNTER — Ambulatory Visit: Admitting: Internal Medicine
